# Patient Record
Sex: Female | Born: 1965 | Race: White | Hispanic: No | Smoking: Former smoker
Health system: Southern US, Community
[De-identification: ages and names within clinical notes are randomized; demographics above are authoritative.]

## PROBLEM LIST (undated history)

## (undated) DIAGNOSIS — C50919 Malignant neoplasm of unspecified site of unspecified female breast: Secondary | ICD-10-CM

## (undated) DIAGNOSIS — I10 Essential (primary) hypertension: Secondary | ICD-10-CM

## (undated) DIAGNOSIS — N390 Urinary tract infection, site not specified: Secondary | ICD-10-CM

## (undated) DIAGNOSIS — I499 Cardiac arrhythmia, unspecified: Secondary | ICD-10-CM

## (undated) DIAGNOSIS — T8859XA Other complications of anesthesia, initial encounter: Secondary | ICD-10-CM

## (undated) DIAGNOSIS — C801 Malignant (primary) neoplasm, unspecified: Secondary | ICD-10-CM

## (undated) DIAGNOSIS — K219 Gastro-esophageal reflux disease without esophagitis: Secondary | ICD-10-CM

## (undated) DIAGNOSIS — T7840XA Allergy, unspecified, initial encounter: Secondary | ICD-10-CM

## (undated) HISTORY — DX: Malignant (primary) neoplasm, unspecified: C80.1

## (undated) HISTORY — PX: MASTECTOMY: SHX3

## (undated) HISTORY — DX: Allergy, unspecified, initial encounter: T78.40XA

## (undated) HISTORY — DX: Gastro-esophageal reflux disease without esophagitis: K21.9

## (undated) HISTORY — DX: Essential (primary) hypertension: I10

## (undated) HISTORY — PX: PLANTAR FASCIA RELEASE: SHX2239

## (undated) HISTORY — DX: Urinary tract infection, site not specified: N39.0

## (undated) HISTORY — DX: Cardiac arrhythmia, unspecified: I49.9

## (undated) HISTORY — PX: OTHER SURGICAL HISTORY: SHX169

## (undated) HISTORY — PX: CHOLECYSTECTOMY: SHX55

## (undated) HISTORY — PX: SPINE SURGERY: SHX786

---

## 2003-07-17 HISTORY — PX: NASAL SINUS SURGERY: SHX719

## 2019-09-04 ENCOUNTER — Other Ambulatory Visit: Payer: Self-pay

## 2019-09-04 ENCOUNTER — Ambulatory Visit
Admission: EM | Admit: 2019-09-04 | Discharge: 2019-09-04 | Disposition: A | Discharge status: Home / Self Care | Payer: BC Managed Care – PPO | Attending: Emergency Medicine | Admitting: Emergency Medicine

## 2019-09-04 DIAGNOSIS — R030 Elevated blood-pressure reading, without diagnosis of hypertension: Secondary | ICD-10-CM

## 2019-09-04 DIAGNOSIS — R300 Dysuria: Secondary | ICD-10-CM | POA: Diagnosis not present

## 2019-09-04 LAB — POCT URINALYSIS DIP (MANUAL ENTRY)
Bilirubin, UA: NEGATIVE
Blood, UA: NEGATIVE
Glucose, UA: NEGATIVE mg/dL
Ketones, POC UA: NEGATIVE mg/dL
Leukocytes, UA: NEGATIVE
Nitrite, UA: NEGATIVE
Protein Ur, POC: NEGATIVE mg/dL
Spec Grav, UA: 1.015
Urobilinogen, UA: 0.2 U/dL
pH, UA: 6.5

## 2019-09-04 MED ORDER — SULFAMETHOXAZOLE-TRIMETHOPRIM 800-160 MG PO TABS: 1.0000 | ORAL_TABLET | Freq: Two times a day (BID) | ORAL | 0 refills | Status: AC

## 2019-09-04 NOTE — ED Triage Notes (Signed)
Pt presents with recurrent UTI; pt states she has had urinary urgency and lower pelvic pain since the first. Pt has been on macobid X 1 week by PCP. Pt states she had her first covid vaccine in the begininng of the month.

## 2019-09-04 NOTE — ED Provider Notes (Addendum)
[facility] VIDEO VISITS    CSN: [id number] Arrival date & time: 09/04/19  1254      History   Chief Complaint Chief Complaint  Patient presents with  . Recurrent UTI    HPI Chriistine James is a 54 y.o. female.   Patient presents with dysuria, frequency, urgency, suprapubic pain x2 weeks.  She has been taking Macrobid x5 days and her symptoms are getting worse; She has frequent UTIs and has a standing order for Macrobid to take if she has symptoms.  She now reports chills and flank pain.  Patient also reports her second COVID vaccine was given 2 weeks ago.  She denies fever, vomiting, rash, vaginal discharge, pelvic pain, or other symptoms.  She is not sexually active.    The history is provided by the patient.    History reviewed. No pertinent past medical history.  There are no problems to display for this patient.   Past Surgical History:  Procedure Laterality Date  . CHOLECYSTECTOMY    . L4-5 herniated disc repair    . PLANTAR FASCIA RELEASE      OB History   No obstetric history on file.      Home Medications    Prior to Admission medications   Medication Sig Start Date End Date Taking? Authorizing Provider  sulfamethoxazole-trimethoprim (BACTRIM DS) 800-160 MG tablet Take 1 tablet by mouth 2 (two) times daily for 5 days. 09/04/19 09/09/19  Sharion Balloon, NP    Family History Family History  Family history unknown: Yes    Social History Social History   Tobacco Use  . Smoking status: Never Smoker  Substance Use Topics  . Alcohol use: Not on file  . Drug use: Not on file     Allergies   Penicillins   Review of Systems Review of Systems  Constitutional: Positive for chills. Negative for fever.  HENT: Negative for ear pain and sore throat.   Eyes: Negative for pain and visual disturbance.  Respiratory: Negative for cough and shortness of breath.   Cardiovascular: Negative for chest pain and palpitations.  Gastrointestinal: Negative for  abdominal pain, diarrhea and vomiting.  Genitourinary: Positive for dysuria, flank pain, frequency and urgency. Negative for hematuria, pelvic pain and vaginal discharge.  Musculoskeletal: Negative for arthralgias and back pain.  Skin: Negative for color change and rash.  Neurological: Negative for seizures and syncope.  All other systems reviewed and are negative.    Physical Exam Triage Vital Signs ED Triage Vitals  Enc Vitals Group     BP      Pulse      Resp      Temp      Temp src      SpO2      Weight      Height      Head Circumference      Peak Flow      Pain Score      Pain Loc      Pain Edu?      Excl. in [place]?    No data found.  Updated Vital Signs BP (!) 167/113 (BP Location: Left Arm)   Pulse 86   Temp 98.8 F (37.1 C) (Oral)   Resp 18   SpO2 98%   Visual Acuity Right Eye Distance:   Left Eye Distance:   Bilateral Distance:    Right Eye Near:   Left Eye Near:    Bilateral Near:     Physical Exam Vitals and  nursing note reviewed.  Constitutional:      General: She is not in acute distress.    Appearance: She is well-developed. She is not ill-appearing.  HENT:     Head: Normocephalic and atraumatic.     Mouth/Throat:     Mouth: Mucous membranes are moist.     Pharynx: Oropharynx is clear.  Eyes:     Conjunctiva/sclera: Conjunctivae normal.  Cardiovascular:     Rate and Rhythm: Normal rate and regular rhythm.     Heart sounds: No murmur.  Pulmonary:     Effort: Pulmonary effort is normal. No respiratory distress.     Breath sounds: Normal breath sounds.  Abdominal:     General: Bowel sounds are normal.     Palpations: Abdomen is soft.     Tenderness: There is no abdominal tenderness. There is no right CVA tenderness, left CVA tenderness, guarding or rebound.  Musculoskeletal:     Cervical back: Neck supple.  Skin:    General: Skin is warm and dry.  Neurological:     General: No focal deficit present.     Mental Status: She is alert  and oriented to person, place, and time.  Psychiatric:        Mood and Affect: Mood normal.        Behavior: Behavior normal.      UC Treatments / Results  Labs (all labs ordered are listed, but only abnormal results are displayed) Labs Reviewed  POCT URINALYSIS DIP (MANUAL ENTRY)    EKG   Radiology No results found.  Procedures Procedures (including critical care time)  Medications Ordered in UC Medications - No data to display  Initial Impression / Assessment and Plan / UC Course  I have reviewed the triage vital signs and the nursing notes.  Pertinent labs & imaging results that were available during my care of the patient were reviewed by me and considered in my medical decision making (see chart for details).   Dysuria, likely UTI.  Patient's urine is negative but, given her symptoms, UTI may be masked by her current antibiotic.  D/C Macrobid.  Start Bactrim.  Instructed patient to follow-up with her PCP if her symptoms are not improving.  Discussed with patient that her blood pressure is elevated and needs to be rechecked by her PCP in 2 weeks; Patient states her blood pressure is elevated now because she has white coat syndrome and is pain; she regularly monitors her blood pressure at home and has a fitness watch that checks it also.  Patient agrees to plan of care.     Final Clinical Impressions(s) / UC Diagnoses   Final diagnoses:  Dysuria  Elevated blood pressure reading     Discharge Instructions     Take the antibiotic as directed.    Follow up with your primary care provider if your symptoms are not improving.    Your blood pressure is elevated today at 167/113.  Please have this rechecked by your primary care provider in 2 weeks.           ED Prescriptions    Medication Sig Dispense Auth. Provider   sulfamethoxazole-trimethoprim (BACTRIM DS) 800-160 MG tablet Take 1 tablet by mouth 2 (two) times daily for 5 days. 10 tablet Sharion Balloon, NP       PDMP not reviewed this encounter.   Sharion Balloon, NP 09/04/19 1333    Sharion Balloon, NP 09/04/19 1400

## 2019-09-04 NOTE — Discharge Instructions (Addendum)
Take the antibiotic as directed.    Follow up with your primary care provider if your symptoms are not improving.    Your blood pressure is elevated today at 167/113.  Please have this rechecked by your primary care provider in 2 weeks.

## 2019-09-15 ENCOUNTER — Encounter: Payer: Self-pay | Admitting: Adult Health

## 2019-09-15 ENCOUNTER — Other Ambulatory Visit: Payer: Self-pay

## 2019-09-15 ENCOUNTER — Ambulatory Visit: Payer: BC Managed Care – PPO | Admitting: Adult Health

## 2019-09-15 VITALS — BP 138/90 | HR 85 | Temp 96.9°F | Resp 16 | Ht 69.0 in | Wt 213.0 lb

## 2019-09-15 DIAGNOSIS — Z0000 Encounter for general adult medical examination without abnormal findings: Secondary | ICD-10-CM | POA: Diagnosis not present

## 2019-09-15 DIAGNOSIS — R82998 Other abnormal findings in urine: Secondary | ICD-10-CM

## 2019-09-15 DIAGNOSIS — Z6831 Body mass index (BMI) 31.0-31.9, adult: Secondary | ICD-10-CM

## 2019-09-15 DIAGNOSIS — E559 Vitamin D deficiency, unspecified: Secondary | ICD-10-CM

## 2019-09-15 DIAGNOSIS — Z6833 Body mass index (BMI) 33.0-33.9, adult: Secondary | ICD-10-CM

## 2019-09-15 DIAGNOSIS — Z780 Asymptomatic menopausal state: Secondary | ICD-10-CM

## 2019-09-15 DIAGNOSIS — Z1231 Encounter for screening mammogram for malignant neoplasm of breast: Secondary | ICD-10-CM

## 2019-09-15 DIAGNOSIS — I10 Essential (primary) hypertension: Secondary | ICD-10-CM

## 2019-09-15 HISTORY — DX: Essential (primary) hypertension: I10

## 2019-09-15 HISTORY — DX: Body mass index (BMI) 33.0-33.9, adult: Z68.33

## 2019-09-15 HISTORY — DX: Vitamin D deficiency, unspecified: E55.9

## 2019-09-15 HISTORY — DX: Asymptomatic menopausal state: Z78.0

## 2019-09-15 LAB — POCT URINALYSIS DIPSTICK
Bilirubin, UA: NEGATIVE
Blood, UA: NEGATIVE
Glucose, UA: NEGATIVE
Ketones, UA: NEGATIVE
Nitrite, UA: NEGATIVE
Protein, UA: NEGATIVE
Spec Grav, UA: <=1.005 — AB (ref 1.010–1.025)
Urobilinogen, UA: 0.2 E.U./dL
pH, UA: 7 (ref 5.0–8.0)

## 2019-09-15 MED ORDER — HYDROCHLOROTHIAZIDE 25 MG PO TABS: 25.0000 mg | ORAL_TABLET | Freq: Every day | ORAL | 0 refills | Status: DC

## 2019-09-15 NOTE — Patient Instructions (Addendum)
[place] at [facility]- call to schedule mammogram- inform them of last covid vaccine date.  [place], [place] [address] [address] from [place], [place] [address]   [place] Eating Plan [place] stands for "Dietary Approaches to Stop Hypertension." The DASH eating plan is a healthy eating plan that has been shown to reduce high blood pressure (hypertension). It may also reduce your risk for type 2 diabetes, heart disease, and stroke. The DASH eating plan may also help with weight loss. What are tips for following this plan?  General guidelines  Avoid eating more than 2,300 mg (milligrams) of salt (sodium) a day. If you have hypertension, you may need to reduce your sodium intake to 1,500 mg a day.  Limit alcohol intake to no more than 1 drink a day for nonpregnant women and 2 drinks a day for men. One drink equals 12 oz of beer, 5 oz of wine, or 1 oz of hard liquor.  Work with your health care provider to maintain a healthy body weight or to lose weight. Ask what an ideal weight is for you.  Get at least 30 minutes of exercise that causes your heart to beat faster (aerobic exercise) most days of the week. Activities may include walking, swimming, or biking.  Work with your health care provider or diet and nutrition specialist (dietitian) to adjust your eating plan to your individual calorie needs. Reading food labels   Check food labels for the amount of sodium per serving. Choose foods with less than 5 percent of the Daily Value of sodium. Generally, foods with less than 300 mg of sodium per serving fit into this eating plan.  To find whole grains, look for the word "whole" as the first word in the ingredient list. Shopping  Buy products labeled as "low-sodium" or "no salt added."  Buy fresh foods. Avoid canned foods and premade or frozen meals. Cooking  Avoid adding salt when cooking. Use salt-free seasonings or herbs instead of table salt  or sea salt. Check with your health care provider or pharmacist before using salt substitutes.  Do not fry foods. Cook foods using healthy methods such as baking, boiling, grilling, and broiling instead.  Cook with heart-healthy oils, such as olive, canola, soybean, or sunflower oil. Meal planning  Eat a balanced diet that includes: ? 5 or more servings of fruits and vegetables each day. At each meal, try to fill half of your plate with fruits and vegetables. ? Up to 6-8 servings of whole grains each day. ? Less than 6 oz of lean meat, poultry, or fish each day. A 3-oz serving of meat is about the same size as a deck of cards. One egg equals 1 oz. ? 2 servings of low-fat dairy each day. ? A serving of nuts, seeds, or beans 5 times each week. ? Heart-healthy fats. Healthy fats called Omega-3 fatty acids are found in foods such as flaxseeds and coldwater fish, like sardines, salmon, and mackerel.  Limit how much you eat of the following: ? Canned or prepackaged foods. ? Food that is high in trans fat, such as fried foods. ? Food that is high in saturated fat, such as fatty meat. ? Sweets, desserts, sugary drinks, and other foods with added sugar. ? Full-fat dairy products.  Do not salt foods before eating.  Try to eat at least 2 vegetarian meals each week.  Eat more home-cooked food and less restaurant, buffet, and fast food.  When eating  at a restaurant, ask that your food be prepared with less salt or no salt, if possible. What foods are recommended? The items listed may not be a complete list. Talk with your dietitian about what dietary choices are best for you. Grains Whole-grain or whole-wheat bread. Whole-grain or whole-wheat pasta. Brown rice. [place]row. Bulgur. Whole-grain and low-sodium cereals. Pita bread. Low-fat, low-sodium crackers. Whole-wheat flour tortillas. Vegetables Fresh or frozen vegetables (raw, steamed, roasted, or grilled). Low-sodium or reduced-sodium  tomato and vegetable juice. Low-sodium or reduced-sodium tomato sauce and tomato paste. Low-sodium or reduced-sodium canned vegetables. Fruits All fresh, dried, or frozen fruit. Canned fruit in natural juice (without added sugar). Meat and other protein foods Skinless chicken or [place]. Ground chicken or [place]. Pork with fat trimmed off. Fish and seafood. Egg whites. Dried beans, peas, or lentils. Unsalted nuts, nut butters, and seeds. Unsalted canned beans. Lean cuts of beef with fat trimmed off. Low-sodium, lean deli meat. Dairy Low-fat (1%) or fat-free (skim) milk. Fat-free, low-fat, or reduced-fat cheeses. Nonfat, low-sodium ricotta or cottage cheese. Low-fat or nonfat yogurt. Low-fat, low-sodium cheese. Fats and oils Soft margarine without trans fats. Vegetable oil. Low-fat, reduced-fat, or light mayonnaise and salad dressings (reduced-sodium). Canola, safflower, olive, soybean, and sunflower oils. Avocado. Seasoning and other foods Herbs. Spices. Seasoning mixes without salt. Unsalted popcorn and pretzels. Fat-free sweets. What foods are not recommended? The items listed may not be a complete list. Talk with your dietitian about what dietary choices are best for you. Grains Baked goods made with fat, such as croissants, muffins, or some breads. Dry pasta or rice meal packs. Vegetables Creamed or fried vegetables. Vegetables in a cheese sauce. Regular canned vegetables (not low-sodium or reduced-sodium). Regular canned tomato sauce and paste (not low-sodium or reduced-sodium). Regular tomato and vegetable juice (not low-sodium or reduced-sodium). Angie Fava. Olives. Fruits Canned fruit in a light or heavy syrup. Fried fruit. Fruit in cream or butter sauce. Meat and other protein foods Fatty cuts of meat. Ribs. Fried meat. Berniece Salines. Sausage. Bologna and other processed lunch meats. Salami. Fatback. Hotdogs. Bratwurst. Salted nuts and seeds. Canned beans with added salt. Canned or smoked fish.  Whole eggs or egg yolks. Chicken or [place] with skin. Dairy Whole or 2% milk, cream, and half-and-half. Whole or full-fat cream cheese. Whole-fat or sweetened yogurt. Full-fat cheese. Nondairy creamers. Whipped toppings. Processed cheese and cheese spreads. Fats and oils Butter. Stick margarine. Lard. Shortening. Ghee. Bacon fat. Tropical oils, such as coconut, palm kernel, or palm oil. Seasoning and other foods Salted popcorn and pretzels. Onion salt, garlic salt, seasoned salt, table salt, and sea salt. Worcestershire sauce. Tartar sauce. Barbecue sauce. Teriyaki sauce. Soy sauce, including reduced-sodium. Steak sauce. Canned and packaged gravies. Fish sauce. Oyster sauce. Cocktail sauce. Horseradish that you find on the shelf. Ketchup. Mustard. Meat flavorings and tenderizers. Bouillon cubes. Hot sauce and Tabasco sauce. Premade or packaged marinades. Premade or packaged taco seasonings. Relishes. Regular salad dressings. Where to find more information:  National Heart, Lung, and [place]: https://wilson-eaton.com/  American Heart Association: www.heart.org Summary  The DASH eating plan is a healthy eating plan that has been shown to reduce high blood pressure (hypertension). It may also reduce your risk for type 2 diabetes, heart disease, and stroke.  With the DASH eating plan, you should limit salt (sodium) intake to 2,300 mg a day. If you have hypertension, you may need to reduce your sodium intake to 1,500 mg a day.  When on the DASH eating plan, aim to  eat more fresh fruits and vegetables, whole grains, lean proteins, low-fat dairy, and heart-healthy fats.  Work with your health care provider or diet and nutrition specialist (dietitian) to adjust your eating plan to your individual calorie needs. This information is not intended to replace advice given to you by your health care provider. Make sure you discuss any questions you have with your health care provider. Document Revised:  06/14/2017 Document Reviewed: 06/25/2016 Elsevier Patient Education  [place]. Diltiazem Extended-Release Oral Capsules or Tablets What is this medicine? DILTIAZEM (dil TYE a zem) is a calcium channel blocker. It relaxes your blood vessels and decreases the amount of work the heart has to do. It treats high blood pressure and/or prevents chest pain (also called angina). This medicine may be used for other purposes; ask your health care provider or pharmacist if you have questions. COMMON BRAND NAME(S): Cardizem CD, Cardizem LA, Cardizem SR, Cartia XT, Dilacor XR, Dilt-CD, Diltia XT, Diltzac, Matzim LA, Rema Fendt, TIADYLT ER, Tiamate, Tiazac What should I tell my health care provider before I take this medicine? They need to know if you have any of these conditions:  heart attack  heart disease  irregular heartbeat or rhythm  low blood pressure  an unusual or allergic reaction to diltiazem, other drugs, foods, dyes, or preservatives  pregnant or trying to get pregnant  breast-feeding How should I use this medicine? Take this drug by mouth. Take it as directed on the prescription label at the same time every day. Do not cut, crush or chew this drug. Swallow the capsules whole. You can take it with or without food. If it upsets your stomach, take it with food. Keep taking it unless your health care provider tells you to stop. Talk to your health care provider about the use of this drug in children. Special care may be needed. Overdosage: If you think you have taken too much of this medicine contact a poison control center or emergency room at once. NOTE: This medicine is only for you. Do not share this medicine with others. What if I miss a dose? If you miss a dose, take it as soon as you can. If it is almost time for your next dose, take only that dose. Do not take double or extra doses. What may interact with this medicine? Do not take this medicine with any of the following  medications:  cisapride  hawthorn  pimozide  ranolazine  red yeast rice This medicine may also interact with the following medications:  buspirone  carbamazepine  cimetidine  cyclosporine  digoxin  local anesthetics or general anesthetics  lovastatin  medicines for anxiety or difficulty sleeping like midazolam and triazolam  medicines for high blood pressure or heart problems  quinidine  rifampin, rifabutin, or rifapentine This list may not describe all possible interactions. Give your health care provider a list of all the medicines, herbs, non-prescription drugs, or dietary supplements you use. Also tell them if you smoke, drink alcohol, or use illegal drugs. Some items may interact with your medicine. What should I watch for while using this medicine? Visit your health care provider for regular checks on your progress. Check your blood pressure as directed. Ask your health care provider what your blood pressure should be. Also, find out when you should contact him or her. Do not treat yourself for coughs, colds, or pain while you are using this drug without asking your health care provider for advice. Some drugs may increase your blood  pressure. This drug may cause serious skin reactions. They can happen weeks to months after starting the drug. Contact your health care provider right away if you notice fevers or flu-like symptoms with a rash. The rash may be red or purple and then turn into blisters or peeling of the skin. Or, you might notice a red rash with swelling of the face, lips or lymph nodes in your neck or under your arms. You may get drowsy or dizzy. Do not drive, use machinery, or do anything that needs mental alertness until you know how this drug affects you. Do not stand up or sit up quickly, especially if you are an older patient. This reduces the risk of dizzy or fainting spells. What side effects may I notice from receiving this medicine? Side effects  that you should report to your doctor or health care provider as soon as possible:  allergic reactions (skin rash, itching or hives; swelling of the face, lips, or tongue)  heart failure (trouble breathing; fast, irregular heartbeat; sudden weight gain; swelling of the ankles, feet, hands; unusually weak or tired)  heartbeat rhythm changes (trouble breathing; chest pain; dizziness; fast, irregular heartbeat; feeling faint or lightheaded, falls)  liver injury (dark yellow or brown urine; general ill feeling or flu-like symptoms; loss of appetite, right upper belly pain; unusually weak or tired, yellowing of the eyes or skin)  redness, blistering, peeling, or loosening of the skin, including inside the mouth Side effects that usually do not require medical attention (report to your doctor or health care provider if they continue or are bothersome):  changes in sex drive or performance  changes in vision  cough  depressed mood  headache  nasal congestion (like runny or stuffy nose)  sudden weight gain  trouble sleeping This list may not describe all possible side effects. Call your doctor for medical advice about side effects. You may report side effects to FDA at 1-800-FDA-1088. Where should I keep my medicine? Keep out of the reach of children and pets. Store at room temperature between 20 and 25 degrees C (68 and 77 degrees F). Protect from moisture. Keep the container tightly closed. Throw away any unused drug after the expiration date. NOTE: This sheet is a summary. It may not cover all possible information. If you have questions about this medicine, talk to your doctor, pharmacist, or health care provider.  2020 Elsevier/Gold Standard (2019-03-24 14:48:13) Hydrochlorothiazide, HCTZ Oral Capsules or Tablets What is this medicine? HYDROCHLOROTHIAZIDE (hye droe klor oh THYE a zide) is a diuretic. It helps you make more urine and to lose salt and excess water from your body. It  treats swelling from heart, kidney, or liver disease. It also treats high blood pressure. This medicine may be used for other purposes; ask your health care provider or pharmacist if you have questions. COMMON BRAND NAME(S): Esidrix, Ezide, HydroDIURIL, Microzide, Oretic, Zide What should I tell my health care provider before I take this medicine? They need to know if you have any of these conditions:  diabetes  gout  immune system problems, like lupus  kidney disease or kidney stones  liver disease  pancreatitis  small amount of urine or difficulty passing urine  an unusual or allergic reaction to hydrochlorothiazide, sulfa drugs, other medicines, foods, dyes, or preservatives  pregnant or trying to get pregnant  breast-feeding How should I use this medicine? Take this drug by mouth. Take it as directed on the prescription label at the same time every day.  You can take it with or without food. If it upsets your stomach, take it with food. Keep taking it unless your health care provider tells you to stop. Talk to your health care provider about the use of this drug in children. While it may be prescribed for children as young as newborns for selected conditions, precautions do apply. Overdosage: If you think you have taken too much of this medicine contact a poison control center or emergency room at once. NOTE: This medicine is only for you. Do not share this medicine with others. What if I miss a dose? If you miss a dose, take it as soon as you can. If it is almost time for your next dose, take only that dose. Do not take double or extra doses. What may interact with this medicine?  cholestyramine  colestipol  digoxin  dofetilide  lithium  medicines for blood pressure  medicines for diabetes  medicines that relax muscles for surgery  other diuretics  steroid medicines like prednisone or cortisone This list may not describe all possible interactions. Give your  health care provider a list of all the medicines, herbs, non-prescription drugs, or dietary supplements you use. Also tell them if you smoke, drink alcohol, or use illegal drugs. Some items may interact with your medicine. What should I watch for while using this medicine? Visit your doctor or health care professional for regular checks on your progress. Check your blood pressure as directed. Ask your doctor or health care professional what your blood pressure should be and when you should contact him or her. Talk to your health care professional about your risk of skin cancer. You may be more at risk for skin cancer if you take this medicine. This medicine can make you more sensitive to the sun. Keep out of the sun. If you cannot avoid being in the sun, wear protective clothing and use sunscreen. Do not use sun lamps or tanning beds/booths. You may need to be on a special diet while taking this medicine. Ask your doctor. Check with your doctor or health care professional if you get an attack of severe diarrhea, nausea and vomiting, or if you sweat a lot. The loss of too much body fluid can make it dangerous for you to take this medicine. You may get drowsy or dizzy. Do not drive, use machinery, or do anything that needs mental alertness until you know how this medicine affects you. Do not stand or sit up quickly, especially if you are an older patient. This reduces the risk of dizzy or fainting spells. Alcohol may interfere with the effect of this medicine. Avoid alcoholic drinks. This medicine may increase blood sugar. Ask your healthcare provider if changes in diet or medicines are needed if you have diabetes. What side effects may I notice from receiving this medicine? Side effects that you should report to your doctor or health care professional as soon as possible:  allergic reactions such as skin rash or itching, hives, swelling of the lips, mouth, tongue, or throat  changes in vision  chest  pain  eye pain  fast or irregular heartbeat  feeling faint or lightheaded, falls  gout attack  muscle pain or cramps  pain or difficulty when passing urine  pain, tingling, numbness in the hands or feet  redness, blistering, peeling or loosening of the skin, including inside the mouth   signs and symptoms of high blood sugar such as being more thirsty or hungry or having to urinate  more than normal. You may also feel very tired or have blurry vision.  unusually weak Side effects that usually do not require medical attention (report to your doctor or health care professional if they continue or are bothersome):  change in sex drive or performance  dry mouth  headache  stomach upset This list may not describe all possible side effects. Call your doctor for medical advice about side effects. You may report side effects to FDA at 1-800-FDA-1088. Where should I keep my medicine? Keep out of the reach of children and pets. Store at room temperature between 20 and 25 degrees C (68 and 77 degrees F). Protect from light and moisture. Keep the container tightly closed. Do not freeze. Throw away any unused drug after the expiration date. NOTE: This sheet is a summary. It may not cover all possible information. If you have questions about this medicine, talk to your doctor, pharmacist, or health care provider.  2020 Elsevier/Gold Standard (2019-03-05 16:52:59)  Hypertension, Adult Hypertension is another name for high blood pressure. High blood pressure forces your heart to work harder to pump blood. This can cause problems over time. There are two numbers in a blood pressure reading. There is a top number (systolic) over a bottom number (diastolic). It is best to have a blood pressure that is below 120/80. Healthy choices can help lower your blood pressure, or you may need medicine to help lower it. What are the causes? The cause of this condition is not known. Some conditions may be  related to high blood pressure. What increases the risk?  Smoking.  Having type 2 diabetes mellitus, high cholesterol, or both.  Not getting enough exercise or physical activity.  Being overweight.  Having too much fat, sugar, calories, or salt (sodium) in your diet.  Drinking too much alcohol.  Having long-term (chronic) kidney disease.  Having a family history of high blood pressure.  Age. Risk increases with age.  Race. You may be at higher risk if you are African American.  Gender. Men are at higher risk than women before age 70. After age 55, women are at higher risk than men.  Having obstructive sleep apnea.  Stress. What are the signs or symptoms?  High blood pressure may not cause symptoms. Very high blood pressure (hypertensive crisis) may cause: ? Headache. ? Feelings of worry or nervousness (anxiety). ? Shortness of breath. ? Nosebleed. ? A feeling of being sick to your stomach (nausea). ? Throwing up (vomiting). ? Changes in how you see. ? Very bad chest pain. ? Seizures. How is this treated?  This condition is treated by making healthy lifestyle changes, such as: ? Eating healthy foods. ? Exercising more. ? Drinking less alcohol.  Your health care provider may prescribe medicine if lifestyle changes are not enough to get your blood pressure under control, and if: ? Your top number is above 130. ? Your bottom number is above 80.  Your personal target blood pressure may vary. Follow these instructions at home: Eating and drinking   If told, follow the DASH eating plan. To follow this plan: ? Fill one half of your plate at each meal with fruits and vegetables. ? Fill one fourth of your plate at each meal with whole grains. Whole grains include whole-wheat pasta, brown rice, and whole-grain bread. ? Eat or drink low-fat dairy products, such as skim milk or low-fat yogurt. ? Fill one fourth of your plate at each meal with low-fat (lean) proteins.  Low-fat  proteins include fish, chicken without skin, eggs, beans, and tofu. ? Avoid fatty meat, cured and processed meat, or chicken with skin. ? Avoid pre-made or processed food.  Eat less than 1,500 mg of salt each day.  Do not drink alcohol if: ? Your doctor tells you not to drink. ? You are pregnant, may be pregnant, or are planning to become pregnant.  If you drink alcohol: ? Limit how much you use to:  0-1 drink a day for women.  0-2 drinks a day for men. ? Be aware of how much alcohol is in your drink. In the [place] one drink equals one 12 oz bottle of beer (355 mL), one 5 oz glass of wine (148 mL), or one 1 oz glass of hard liquor (44 mL). Lifestyle   Work with your doctor to stay at a healthy weight or to lose weight. Ask your doctor what the best weight is for you.  Get at least 30 minutes of exercise most days of the week. This may include walking, swimming, or biking.  Get at least 30 minutes of exercise that strengthens your muscles (resistance exercise) at least 3 days a week. This may include lifting weights or doing Pilates.  Do not use any products that contain nicotine or tobacco, such as cigarettes, e-cigarettes, and chewing tobacco. If you need help quitting, ask your doctor.  Check your blood pressure at home as told by your doctor.  Keep all follow-up visits as told by your doctor. This is important. Medicines  Take over-the-counter and prescription medicines only as told by your doctor. Follow directions carefully.  Do not skip doses of blood pressure medicine. The medicine does not work as well if you skip doses. Skipping doses also puts you at risk for problems.  Ask your doctor about side effects or reactions to medicines that you should watch for. Contact a doctor if you:  Think you are having a reaction to the medicine you are taking.  Have headaches that keep coming back (recurring).  Feel dizzy.  Have swelling in your ankles.  Have trouble  with your vision. Get help right away if you:  Get a very bad headache.  Start to feel mixed up (confused).  Feel weak or numb.  Feel faint.  Have very bad pain in your: ? Chest. ? Belly (abdomen).  Throw up more than once.  Have trouble breathing. Summary  Hypertension is another name for high blood pressure.  High blood pressure forces your heart to work harder to pump blood.  For most people, a normal blood pressure is less than 120/80.  Making healthy choices can help lower blood pressure. If your blood pressure does not get lower with healthy choices, you may need to take medicine. This information is not intended to replace advice given to you by your health care provider. Make sure you discuss any questions you have with your health care provider. Document Revised: 03/12/2018 Document Reviewed: 03/12/2018 Elsevier Patient Education  2020 [place] for [employer] Calories are units of energy. Your body needs a certain amount of calories from food to keep you going throughout the day. When you eat more calories than your body needs, your body stores the extra calories as fat. When you eat fewer calories than your body needs, your body burns fat to get the energy it needs. Calorie counting means keeping track of how many calories you eat and drink each day. Calorie counting can  be helpful if you need to lose weight. If you make sure to eat fewer calories than your body needs, you should lose weight. Ask your health care provider what a healthy weight is for you. For calorie counting to work, you will need to eat the right number of calories in a day in order to lose a healthy amount of weight per week. A dietitian can help you determine how many calories you need in a day and will give you suggestions on how to reach your calorie goal.  A healthy amount of weight to lose per week is usually 1-2 lb (0.5-0.9 kg). This usually means that your daily  calorie intake should be reduced by 500-750 calories.  Eating 1,200 - 1,500 calories per day can help most women lose weight.  Eating 1,500 - 1,800 calories per day can help most men lose weight. What is my plan? My goal is to have __________ calories per day. If I have this many calories per day, I should lose around __________ pounds per week. What do I need to know about calorie counting? In order to meet your daily calorie goal, you will need to:  Find out how many calories are in each food you would like to eat. Try to do this before you eat.  Decide how much of the food you plan to eat.  Write down what you ate and how many calories it had. Doing this is called keeping a food log. To successfully lose weight, it is important to balance calorie counting with a healthy lifestyle that includes regular activity. Aim for 150 minutes of moderate exercise (such as walking) or 75 minutes of vigorous exercise (such as running) each week. Where do I find calorie information?  The number of calories in a food can be found on a Nutrition Facts label. If a food does not have a Nutrition Facts label, try to look up the calories online or ask your dietitian for help. Remember that calories are listed per serving. If you choose to have more than one serving of a food, you will have to multiply the calories per serving by the amount of servings you plan to eat. For example, the label on a package of bread might say that a serving size is 1 slice and that there are 90 calories in a serving. If you eat 1 slice, you will have eaten 90 calories. If you eat 2 slices, you will have eaten 180 calories. How do I keep a food log? Immediately after each meal, record the following information in your food log:  What you ate. Don't forget to include toppings, sauces, and other extras on the food.  How much you ate. This can be measured in cups, ounces, or number of items.  How many calories each food and drink  had.  The total number of calories in the meal. Keep your food log near you, such as in a small notebook in your pocket, or use a mobile app or website. Some programs will calculate calories for you and show you how many calories you have left for the day to meet your goal. What are some calorie counting tips?   Use your calories on foods and drinks that will fill you up and not leave you hungry: ? Some examples of foods that fill you up are nuts and nut butters, vegetables, lean proteins, and high-fiber foods like whole grains. High-fiber foods are foods with more than 5 g fiber per serving. ?  Drinks such as sodas, specialty coffee drinks, alcohol, and juices have a lot of calories, yet do not fill you up.  Eat nutritious foods and avoid empty calories. Empty calories are calories you get from foods or beverages that do not have many vitamins or protein, such as candy, sweets, and soda. It is better to have a nutritious high-calorie food (such as an avocado) than a food with few nutrients (such as a bag of chips).  Know how many calories are in the foods you eat most often. This will help you calculate calorie counts faster.  Pay attention to calories in drinks. Low-calorie drinks include water and unsweetened drinks.  Pay attention to nutrition labels for "low fat" or "fat free" foods. These foods sometimes have the same amount of calories or more calories than the full fat versions. They also often have added sugar, starch, or salt, to make up for flavor that was removed with the fat.  Find a way of tracking calories that works for you. Get creative. Try different apps or programs if writing down calories does not work for you. What are some portion control tips?  Know how many calories are in a serving. This will help you know how many servings of a certain food you can have.  Use a measuring cup to measure serving sizes. You could also try weighing out portions on a kitchen scale. With  time, you will be able to estimate serving sizes for some foods.  Take some time to put servings of different foods on your favorite plates, bowls, and cups so you know what a serving looks like.  Try not to eat straight from a bag or box. Doing this can lead to overeating. Put the amount you would like to eat in a cup or on a plate to make sure you are eating the right portion.  Use smaller plates, glasses, and bowls to prevent overeating.  Try not to multitask (for example, watch TV or use your computer) while eating. If it is time to eat, sit down at a table and enjoy your food. This will help you to know when you are full. It will also help you to be aware of what you are eating and how much you are eating. What are tips for following this plan? Reading food labels  Check the calorie count compared to the serving size. The serving size may be smaller than what you are used to eating.  Check the source of the calories. Make sure the food you are eating is high in vitamins and protein and low in saturated and trans fats. Shopping  Read nutrition labels while you shop. This will help you make healthy decisions before you decide to purchase your food.  Make a grocery list and stick to it. Cooking  Try to cook your favorite foods in a healthier way. For example, try baking instead of frying.  Use low-fat dairy products. Meal planning  Use more fruits and vegetables. Half of your plate should be fruits and vegetables.  Include lean proteins like poultry and fish. How do I count calories when eating out?  Ask for smaller portion sizes.  Consider sharing an entree and sides instead of getting your own entree.  If you get your own entree, eat only half. Ask for a box at the beginning of your meal and put the rest of your entree in it so you are not tempted to eat it.  If calories are listed on the  menu, choose the lower calorie options.  Choose dishes that include vegetables,  fruits, whole grains, low-fat dairy products, and lean protein.  Choose items that are boiled, broiled, grilled, or steamed. Stay away from items that are buttered, battered, fried, or served with cream sauce. Items labeled "crispy" are usually fried, unless stated otherwise.  Choose water, low-fat milk, unsweetened iced tea, or other drinks without added sugar. If you want an alcoholic beverage, choose a lower calorie option such as a glass of wine or light beer.  Ask for dressings, sauces, and syrups on the side. These are usually high in calories, so you should limit the amount you eat.  If you want a salad, choose a garden salad and ask for grilled meats. Avoid extra toppings like bacon, cheese, or fried items. Ask for the dressing on the side, or ask for olive oil and vinegar or lemon to use as dressing.  Estimate how many servings of a food you are given. For example, a serving of cooked rice is  cup or about the size of half a baseball. Knowing serving sizes will help you be aware of how much food you are eating at restaurants. The list below tells you how big or small some common portion sizes are based on everyday objects: ? 1 oz--4 stacked dice. ? 3 oz--1 deck of cards. ? 1 tsp--1 die. ? 1 Tbsp-- a ping-pong ball. ? 2 Tbsp--1 ping-pong ball. ?  cup-- baseball. ? 1 cup--1 baseball. Summary  Calorie counting means keeping track of how many calories you eat and drink each day. If you eat fewer calories than your body needs, you should lose weight.  A healthy amount of weight to lose per week is usually 1-2 lb (0.5-0.9 kg). This usually means reducing your daily calorie intake by 500-750 calories.  The number of calories in a food can be found on a Nutrition Facts label. If a food does not have a Nutrition Facts label, try to look up the calories online or ask your dietitian for help.  Use your calories on foods and drinks that will fill you up, and not on foods and drinks that  will leave you hungry.  Use smaller plates, glasses, and bowls to prevent overeating. This information is not intended to replace advice given to you by your health care provider. Make sure you discuss any questions you have with your health care provider. Document Revised: 03/21/2018 Document Reviewed: 06/01/2016 [employer] Patient Education  [place].

## 2019-09-15 NOTE — Progress Notes (Signed)
Patient: Kirsten James, Female    DOB: 1965-12-18, 54 y.o.   MRN: [id number] Visit Date: 09/15/2019  Today's Provider: Marcille Buffy, FNP   Chief Complaint  Patient presents with  . New Patient (Initial Visit)   Subjective:    New Patient Kirsten James is a 54 y.o. female who presents today for health maintenance and establish care as a new patient.  Patient reports that she moved to [facility] from [place], patient is a former patient of [employer], patient reports that her last pap examination was 2019 at [facility] and reports that pap was normal.   She feels fairly well today, patient would like to address concerns of hypertension. Patient reports that she was seen at urgent care last week for UTI like symptoms and blood pressure at office visit was 130/110. Patient states that she was previously prescribed Cartia  XT 180 mg  by her former PCP for hypertension but states that she had fair compliance on medication.  She has had hypertension since her second child was born- 2002. She reports she had been able to come off of her medications with diet and exercise. She has had previous stress testing in past was normal. She started her Cartia XT 180vmg back last Friday.  She denies any other cardiac symptoms.  Grandmother had CHF. Denies any other heart disease.  She went through separation, lost 30 lbs, and her blood pressure was normal without medications. [place] cardiologust [place] of state and was cleared.   February she had her second Covid shot- she started [place] XT 180 mg QD when she noticed her blood pressure was up at urgent care and this  was restarted on 09/04/19. She had previously been on this medication about a year prior and her cholesterol was under control.  She was very fatigued with the second immunization. She went to the urgent care and took Bactrim DS for urinary tract infection.  Denies any urinary symptoms.    She reports  exercising walking 2.5 miles daily and going to gym.Denies any issues with exercising.  Denies any shortness of breath.    She reports she is sleeping poorly, patient reports that for the past year she has had menopause like symptoms and states that she has had increased sweating at night which has caused her to have poor sleep. Patient reports for the past year she has experienced crying episodes, hot flashes, mood swings and night sweats. She was previously placed on Effexor in the past but it elevated her blood pressure so she had to discontinue.   She has had increased hot flashes, mood swings, crying at commercials.  She can not tolerate Effexor due to her blood pressure.   Denies any edema.  Mammogram May 2019. Heterogeneously dense. Repeat in year. Screening.   ----------------------------------------------------------------- Patient reports that she had a cologuard screening 07/2018 and reports that screening was negative No LMP recorded. (Menstrual status: Perimenopausal). Last May 2020,PAP smear 2019 and was normal- denies any abnormal's. Denies any abnormal vaginal bleeding or spotting. Denies any pelvic or abdominal pain.   Patient  denies any fever, body aches,chills, rash, chest pain, shortness of breath, nausea, vomiting, or diarrhea.    Review of Systems  Constitutional: Positive for diaphoresis (hot flashes occasional at night x 1 year.). Negative for activity change, appetite change, chills, fatigue, fever and unexpected weight change.  HENT: Negative.   Eyes: Negative.        She saw  eye doctor last year no concerns.  She has recently seen a new dentist for exams.   Respiratory: Negative.   Cardiovascular: Negative.   Gastrointestinal: Negative.   Endocrine: Positive for heat intolerance. Negative for cold intolerance, polydipsia, polyphagia and polyuria.  Genitourinary: Negative.   Musculoskeletal: Negative.   Skin: Negative.   Allergic/Immunologic: Positive for  environmental allergies. Negative for food allergies and immunocompromised state.  Neurological: Negative.   Hematological: Negative.   Psychiatric/Behavioral: Negative.   All other systems reviewed and are negative.   Social History She  reports that she has been smoking. She has never used smokeless tobacco. She reports that she does not drink alcohol or use drugs. Social History   Socioeconomic History  . Marital status: Married    Spouse name: Not on file  . Number of children: Not on file  . Years of education: Not on file  . Highest education level: Not on file  Occupational History  . Not on file  Tobacco Use  . Smoking status: Current Every Day Smoker  . Smokeless tobacco: Never Used  Substance and Sexual Activity  . Alcohol use: Never  . Drug use: Never  . Sexual activity: Not Currently  Other Topics Concern  . Not on file  Social History Narrative  . Not on file   Social Determinants of Health   Financial Resource Strain:   . Difficulty of Paying Living Expenses: Not on file  Food Insecurity:   . Worried About Charity fundraiser in the Last Year: Not on file  . Ran Out of Food in the Last Year: Not on file  Transportation Needs:   . Lack of Transportation (Medical): Not on file  . Lack of Transportation (Non-Medical): Not on file  Physical Activity:   . Days of Exercise per Week: Not on file  . Minutes of Exercise per Session: Not on file  Stress:   . Feeling of Stress : Not on file  Social Connections:   . Frequency of Communication with Friends and Family: Not on file  . Frequency of Social Gatherings with Friends and Family: Not on file  . Attends Religious Services: Not on file  . Active Member of Clubs or Organizations: Not on file  . Attends Archivist Meetings: Not on file  . Marital Status: Not on file    There are no problems to display for this patient.   Past Surgical History:  Procedure Laterality Date  . CHOLECYSTECTOMY      . L4-5 herniated disc repair    . PLANTAR FASCIA RELEASE      Family History  Family Status  Relation Name Status  . Mother  (Not Specified)  . Father  (Not Specified)  . MGM  (Not Specified)   Her family history includes Alzheimer's disease in her maternal grandmother; Cancer in her father and mother.     Allergies  Allergen Reactions  . Amoxicillin Hives  . Other     Allergy to pollen and mold- patient reports watery eyes, runny nose and sneezing as reaction  . Penicillins Hives    Previous Medications   ACETAMINOPHEN (TYLENOL) 325 MG TABLET    Take 650 mg by mouth every 6 (six) hours as needed.   AZELASTINE (ASTELIN) 0.1 % NASAL SPRAY    Place into both nostrils 2 (two) times daily. Use in each nostril as directed   CHOLECALCIFEROL (VITAMIN D3 PO)    Take by mouth. 5000IU   DILTIAZEM (CARDIZEM CD)  180 MG 24 HR CAPSULE    Take 180 mg by mouth daily.   EQL EVENING PRIMROSE OIL PO    Take by mouth. 1300mg  qd   FEXOFENADINE-PSEUDOEPHEDRINE (ALLEGRA-D 24) 180-240 MG 24 HR TABLET    Take 1 tablet by mouth daily.   FLUTICASONE (FLONASE) 50 MCG/ACT NASAL SPRAY    Place into both nostrils daily.   MULTIPLE VITAMIN (MULTIVITAMIN) TABLET    Take 1 tablet by mouth daily.   NITROFURANTOIN (MACRODANTIN) 50 MG CAPSULE    Take 50 mg by mouth 4 (four) times daily. Patient reports PRN for UTI   OMEGA 3 1000 MG CAPS    Take by mouth.   TURMERIC CURCUMIN 500 MG CAPS    Take by mouth.    Patient Care Team: Doreen Beam, FNP as PCP - General (Family Medicine)      Objective:   Vitals: BP (!) 140/102   Pulse 85   Temp (!) 96.9 F (36.1 C) (Oral)   Resp 16   Ht 5\' 9"  (1.753 m)   Wt 213 lb (96.6 kg)   SpO2 98%   BMI 31.45 kg/m    Physical Exam Vitals reviewed.  Constitutional:      General: She is not in acute distress.    Appearance: She is well-developed. She is not diaphoretic.     Interventions: She is not intubated. HENT:     Head: Normocephalic and atraumatic.      Right Ear: External ear normal.     Left Ear: External ear normal.     Nose: Nose normal.     Mouth/Throat:     Pharynx: No oropharyngeal exudate.  Eyes:     General: Lids are normal. No scleral icterus.       Right eye: No discharge.        Left eye: No discharge.     Conjunctiva/sclera: Conjunctivae normal.     Right eye: Right conjunctiva is not injected. No exudate or hemorrhage.    Left eye: Left conjunctiva is not injected. No exudate or hemorrhage.    Pupils: Pupils are equal, round, and reactive to light.  Neck:     Thyroid: No thyroid mass or thyromegaly.     Vascular: Normal carotid pulses. No carotid bruit, hepatojugular reflux or JVD.     Trachea: Trachea and phonation normal. No tracheal tenderness or tracheal deviation.     Meningeal: Brudzinski's sign and Kernig's sign absent.  Cardiovascular:     Rate and Rhythm: Normal rate and regular rhythm.     Pulses: Normal pulses.          Radial pulses are 2+ on the right side and 2+ on the left side.       Dorsalis pedis pulses are 2+ on the right side and 2+ on the left side.       Posterior tibial pulses are 2+ on the right side and 2+ on the left side.     Heart sounds: Normal heart sounds, S1 normal and S2 normal. Heart sounds not distant. No murmur. No friction rub. No gallop.   Pulmonary:     Effort: Pulmonary effort is normal. No tachypnea, bradypnea, accessory muscle usage or respiratory distress. She is not intubated.     Breath sounds: Normal breath sounds. No stridor. No wheezing or rales.  Chest:     Chest wall: No tenderness.  Abdominal:     General: Bowel sounds are normal. There is no distension or abdominal bruit.  Palpations: Abdomen is soft. There is no shifting dullness, fluid wave, hepatomegaly, splenomegaly, mass or pulsatile mass.     Tenderness: There is no abdominal tenderness. There is no guarding or rebound.     Hernia: No hernia is present.  Genitourinary:    Comments: Declines any concerns.  No abnormal vaginal discharge. No spotting or discharge. Denies rectal pain, pressure, or bloody or dark colored stools.  Last normal pap reported by patient as 2019. denies ever having an abnormal. Musculoskeletal:        General: No tenderness or deformity. Normal range of motion.     Cervical back: Full passive range of motion without pain, normal range of motion and neck supple. No edema, erythema or rigidity. No spinous process tenderness or muscular tenderness. Normal range of motion.  Lymphadenopathy:     Head:     Right side of head: No submental, submandibular, tonsillar, preauricular, posterior auricular or occipital adenopathy.     Left side of head: No submental, submandibular, tonsillar, preauricular, posterior auricular or occipital adenopathy.     Cervical: No cervical adenopathy.     Right cervical: No superficial, deep or posterior cervical adenopathy.    Left cervical: No superficial, deep or posterior cervical adenopathy.     Upper Body:     Right upper body: No supraclavicular or pectoral adenopathy.     Left upper body: No supraclavicular or pectoral adenopathy.  Skin:    General: Skin is warm and dry.     Coloration: Skin is not pale.     Findings: No abrasion, bruising, burn, ecchymosis, erythema, lesion, petechiae or rash.     Nails: There is no clubbing.  Neurological:     Mental Status: She is alert and oriented to person, place, and time.     GCS: GCS eye subscore is 4. GCS verbal subscore is 5. GCS motor subscore is 6.     Cranial Nerves: No cranial nerve deficit.     Sensory: No sensory deficit.     Motor: No tremor, atrophy, abnormal muscle tone or seizure activity.     Coordination: Coordination normal.     Gait: Gait normal.     Deep Tendon Reflexes: Reflexes are normal and symmetric. Reflexes normal. Babinski sign absent on the right side. Babinski sign absent on the left side.     Reflex Scores:      Tricep reflexes are 2+ on the right side and 2+ on  the left side.      Bicep reflexes are 2+ on the right side and 2+ on the left side.      Brachioradialis reflexes are 2+ on the right side and 2+ on the left side.      Patellar reflexes are 2+ on the right side and 2+ on the left side.      Achilles reflexes are 2+ on the right side and 2+ on the left side. Psychiatric:        Speech: Speech normal.        Behavior: Behavior normal.        Thought Content: Thought content normal.        Judgment: Judgment normal.      Depression Screen PHQ 2/9 Scores 09/15/2019 09/15/2019  PHQ - 2 Score 0 0  PHQ- 9 Score 0 -       Assessment & Plan:     Routine Health Maintenance and Physical Exam  Exercise Activities and Dietary recommendations Goals   None  Immunization History  Administered Date(s) Administered  . PFIZER SARS-COV-2 Vaccination 08/10/2019, 08/31/2019  . PPD Test 05/01/2019    Health Maintenance  Topic Date Due  . HIV Screening  11/14/1980  . TETANUS/TDAP  11/14/1984  . PAP SMEAR-Modifier  11/15/1986  . MAMMOGRAM  11/15/2015  . Fecal DNA (Cologuard)  11/15/2015  . INFLUENZA VACCINE  02/14/2019   Dentist twice yearly.  Eye exam yearly.   Discussed health benefits of physical activity, and encouraged her to engage in regular exercise appropriate for her age and condition.     1. Encounter for routine adult health examination without abnormal findings Routine health maintenance discussed.  - POCT urinalysis dipstick  2. Encounter for screening mammogram for malignant neoplasm of breast  - MM Digital Screening; Future  3. Body mass index (BMI) of 31.0-31.9 in adult Encourage weight loss/ diet and exercise.   4. Leukocytes in urine Sent for culture given recent history. - Urine Culture  5. Menopause Discussed symptoms.  Will check the following.  - FSH/LH - Testosterone,Free and Total - Progesterone  6. Essential hypertension   She will continue her Cartia XT  180 mg daily since this before  kept her blood pressure under control and she just restarted last Friday. Will add HCTZ 25 mg by mouth daily in the morning. Remain hydrated. DASH diet.  Report any hypertension that is persistent or any hypotensive episodes. Keep log of blood pressure readings. Discussed RED flags. Fasting labs this week. She denies any kidney or liver dysfunction in the past or thyroid disease.  Discussed side effects of both blood pressure medications. She could not take lisinopril due to side effect. of cough.   Meds ordered this encounter  Medications  . hydrochlorothiazide (HYDRODIURIL) 25 MG tablet    Sig: Take 1 tablet (25 mg total) by mouth daily.    Dispense:  30 tablet    Refill:  0   - EKG 12-Lead- EKG reviewed and within normal limits. NO other associated cardiac symptoms.  - CBC with Differential/Platelet - Comprehensive Metabolic Panel (CMET) - TSH; Future - Lipid Panel w/o Chol/HDL Ratio  7. Vitamin D deficiency She is on supplement will recheck level.  - VITAMIN D 25 Hydroxy (Vit-D Deficiency, Fractures)   Return in about 3 weeks (around 10/06/2019), or if symptoms worsen or fail to improve, for at any time for any worsening symptoms, Go to Emergency room/ urgent care if worse.  Advised patient call the office or your primary care doctor for an appointment if no improvement within 72 hours or if any symptoms change or worsen at any time  Advised ER or urgent Care if after hours or on weekend. Call 911 for emergency symptoms at any time.Patinet verbalized understanding of all instructions given/reviewed and treatment plan and has no further questions or concerns at this time.   The entirety of the information documented in the History of Present Illness, Review of Systems and Physical Exam were personally obtained by me. Portions of this information were initially documented by the  Certified Medical Assistant whose name is documented in [facility] [place] and reviewed by me for thoroughness and accuracy.    I have personally performed the exam and reviewed the chart and it is accurate to the best of my knowledge.  Haematologist has been used and any errors in dictation or transcription are unintentional.  Kelby Aline. [facility]   --------------------------------------------------------------------

## 2019-09-17 LAB — URINE CULTURE: Organism ID, Bacteria: NO GROWTH

## 2019-09-17 NOTE — Progress Notes (Signed)
No growth on urine. No treatment advised. Return if any symptoms return.

## 2019-09-21 ENCOUNTER — Other Ambulatory Visit: Payer: Self-pay

## 2019-09-21 DIAGNOSIS — I10 Essential (primary) hypertension: Secondary | ICD-10-CM

## 2019-09-25 LAB — COMPREHENSIVE METABOLIC PANEL
ALT: 24 IU/L (ref 0–32)
AST: 21 IU/L (ref 0–40)
Albumin/Globulin Ratio: 1.8 (ref 1.2–2.2)
Albumin: 4.7 g/dL (ref 3.8–4.9)
Alkaline Phosphatase: 99 IU/L (ref 39–117)
BUN/Creatinine Ratio: 15 (ref 9–23)
BUN: 10 mg/dL (ref 6–24)
Bilirubin Total: 0.4 mg/dL (ref 0.0–1.2)
CO2: 23 mmol/L (ref 20–29)
Calcium: 9.8 mg/dL (ref 8.7–10.2)
Chloride: 101 mmol/L (ref 96–106)
Creatinine, Ser: 0.67 mg/dL (ref 0.57–1.00)
GFR calc Af Amer: 116 mL/min/{1.73_m2} (ref >59)
GFR calc non Af Amer: 101 mL/min/{1.73_m2} (ref >59)
Globulin, Total: 2.6 g/dL (ref 1.5–4.5)
Glucose: 95 mg/dL (ref 65–99)
Potassium: 4.3 mmol/L (ref 3.5–5.2)
Sodium: 139 mmol/L (ref 134–144)
Total Protein: 7.3 g/dL (ref 6.0–8.5)

## 2019-09-25 LAB — CBC WITH DIFFERENTIAL/PLATELET
Basophils Absolute: 0.1 10*3/uL (ref 0.0–0.2)
Basos: 1 %
EOS (ABSOLUTE): 0.1 10*3/uL (ref 0.0–0.4)
Eos: 2 %
Hematocrit: 48.3 % — ABNORMAL HIGH (ref 34.0–46.6)
Hemoglobin: 16.1 g/dL — ABNORMAL HIGH (ref 11.1–15.9)
Immature Grans (Abs): 0 10*3/uL (ref 0.0–0.1)
Immature Granulocytes: 0 %
Lymphocytes Absolute: 2.8 10*3/uL (ref 0.7–3.1)
Lymphs: 40 %
MCH: 28.7 pg (ref 26.6–33.0)
MCHC: 33.3 g/dL (ref 31.5–35.7)
MCV: 86 fL (ref 79–97)
Monocytes Absolute: 0.5 10*3/uL (ref 0.1–0.9)
Monocytes: 7 %
Neutrophils Absolute: 3.6 10*3/uL (ref 1.4–7.0)
Neutrophils: 50 %
Platelets: 296 10*3/uL (ref 150–450)
RBC: 5.61 x10E6/uL — ABNORMAL HIGH (ref 3.77–5.28)
RDW: 14.1 % (ref 11.7–15.4)
WBC: 7.1 10*3/uL (ref 3.4–10.8)

## 2019-09-25 LAB — PROGESTERONE: Progesterone: 0.2 ng/mL

## 2019-09-25 LAB — FSH/LH
FSH: 91.5 m[IU]/mL
LH: 47.7 m[IU]/mL

## 2019-09-25 LAB — TESTOSTERONE,FREE AND TOTAL
Testosterone, Free: 2.4 pg/mL (ref 0.0–4.2)
Testosterone: 12 ng/dL (ref 3–41)

## 2019-09-25 LAB — LIPID PANEL W/O CHOL/HDL RATIO
Cholesterol, Total: 193 mg/dL (ref 100–199)
HDL: 67 mg/dL (ref >39)
LDL Chol Calc (NIH): 109 mg/dL — ABNORMAL HIGH (ref 0–99)
Triglycerides: 96 mg/dL (ref 0–149)
VLDL Cholesterol Cal: 17 mg/dL (ref 5–40)

## 2019-09-25 LAB — TSH: TSH: 1.34 u[IU]/mL (ref 0.450–4.500)

## 2019-09-25 LAB — VITAMIN D 25 HYDROXY (VIT D DEFICIENCY, FRACTURES): Vit D, 25-Hydroxy: 44.8 ng/mL (ref 30.0–100.0)

## 2019-09-25 NOTE — Progress Notes (Signed)
We added on a TIBC and iron panel and it is still pending.   1.She has elevated red blood cells slightly, elevated hemoglobin and hematocrit.  Verify if she is taking a multivitamin with iron, any iron supplements or drinks/ supplements high in iron ? If so we should stop the iron and recheck this level in 1-2 month at lab repeat CBC.   2. If no supplements then please ask - Any history of personal or family history of hemochromatosis ? Has she had elevated Hgb/ Hct in past ?   3.Her hormone labs do show she is post menopausal- if she wants to discuss hormones we can send her to gynecology.   4.Vitamin D level is normal, recommend maintaining  by taking Vitamin D 3 over the counter at 2,000 to 4,000 international units daily by mouth and rechecking lab in around 4 to 6 months.   5.LDL mildly elevated at 109.  Discuss lifestyle modification with patient e.g. increase exercise, fiber, fruits, vegetables, lean meat, and omega 3/fish intake and decrease saturated fat.    Needs CBC repeat in 1 to 2 months and Vitamin D lab in 6 months.  Follow up PRN and as discussed at last office visit.

## 2019-10-09 ENCOUNTER — Ambulatory Visit: Payer: BC Managed Care – PPO | Admitting: Adult Health

## 2019-10-16 NOTE — Progress Notes (Signed)
Patient: Kirsten James Female    DOB: 1966/03/27   54 y.o.   MRN: [id number] Visit Date: 10/19/2019  Today's Provider: Marcille Buffy, FNP   Chief Complaint  Patient presents with  . Follow-up  . Hypertension   Subjective:       Hypertension, follow-up:  BP Readings from Last 3 Encounters:  10/19/19 131/83  09/15/19 138/90  09/04/19 (!) 167/113    She was last seen for hypertension 1 months ago.  BP at that visit was 138 90. Management since that visit includes Medication, diet .She reports poor compliance with treatment. She is having side effects. Headaches She is exercising. She is adherent to low salt diet.   Outside blood pressures are 140/90's. She is experiencing none.  Patient denies chest pain, chest pressure/discomfort, claudication, dyspnea, exertional chest pressure/discomfort, fatigue, irregular heart beat, lower extremity edema, near-syncope, orthopnea, palpitations, paroxysmal nocturnal dyspnea, syncope and tachypnea.   Cardiovascular risk factors include hypertension.  Use of agents associated with hypertension: decongestants.  She is still using Allegra-D occasionally.   She stopped her HCTZ after three weeks after frequent urination. She has stopped caffeine, doing nicotine patch. 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  Denies any suicidal thoughts or depression.   She has allegra D. She is advised not to take the decongestant.  Is advised to take regular Allegra.  Her hemoglobin was just mildly elevated at last visit in March, hematocrit was also elevated, she is on a plant-based diet eating lots of greens and vegetables.  No urinary symptoms.   Ultrasound - pelvis   PAP smear - Declines. She has no bleeding or spotting.  Has a history of abnormal Pap smears in the past, she has also had a cervical biopsy and they wanted to do a LEEP  procedure which she declined.  She desires not to have a Pap smear despite education.  Patient declines referral to gynecology for hormones and for further evaluation of previous abnormal Pap smear.  She declines Pap smear in the primary care office as well.  Reviewed patient's records that were received from her previous office, and was noted to have thickened endometrium and right irregular ovarian cyst on 09/04/2018, we will repeat this ultrasound for patient.  She denies any heart disease, patient had a note in her previous chart that she had heart disease, patient reported that her work-up was negative.  She denies any complaints or concerns, she denies lightheadedness dizziness or any radiating pain or chest pain.  She has no shortness of breath.  Is actually feeling better since quitting smoking, she is doing a nicotine patch but prefers to try Wellbutrin 150 extended release, she took this in the past and did well with it.  We will start at 150 mg extended release and can increase as needed and as able.   Patient  denies any fever, body aches,chills, rash, chest pain, shortness of breath, nausea, vomiting, or diarrhea.      Hypertension This is a chronic problem. The problem has been gradually improving since onset. The problem is controlled. Pertinent negatives include no anxiety, blurred vision, chest pain, headaches, malaise/fatigue, neck pain, orthopnea or palpitations. There are no associated agents to hypertension. Risk factors for coronary artery disease include stress. Past treatments include diuretics and lifestyle changes. The current treatment provides mild improvement. Compliance problems include medication side effects.  Allergies  Allergen Reactions  . Amoxicillin Hives  . Other     Allergy to pollen and mold- patient reports watery eyes, runny nose and sneezing as reaction  . Penicillins Hives     Current Outpatient Medications:  .  acetaminophen (TYLENOL) 325 MG  tablet, Take 650 mg by mouth every 6 (six) hours as needed., Disp: , Rfl:  .  azelastine (ASTELIN) 0.1 % nasal spray, Place into both nostrils 2 (two) times daily. Use in each nostril as directed, Disp: , Rfl:  .  Cholecalciferol (VITAMIN D3 PO), Take by mouth. 5000IU, Disp: , Rfl:  .  diltiazem (CARTIA XT) 180 MG 24 hr capsule, Cartia XT 180 mg capsule,extended release  Take 1 capsule every day by oral route., Disp: , Rfl:  .  fexofenadine-pseudoephedrine (ALLEGRA-D 24) 180-240 MG 24 hr tablet, Take 1 tablet by mouth daily., Disp: , Rfl:  .  fluticasone (FLONASE) 50 MCG/ACT nasal spray, Place into both nostrils daily., Disp: , Rfl:  .  Multiple Minerals (CALCIUM/MAGNESIUM/ZINC PO), Take by mouth., Disp: , Rfl:  .  Multiple Vitamin (MULTIVITAMIN) tablet, Take 1 tablet by mouth daily., Disp: , Rfl:  .  nicotine (NICODERM CQ - DOSED IN MG/24 HOURS) 14 mg/24hr patch, Place 14 mg onto the skin daily., Disp: , Rfl:  .  nitrofurantoin (MACRODANTIN) 50 MG capsule, Take 50 mg by mouth 4 (four) times daily. Patient reports PRN for UTI, Disp: , Rfl:  .  norethindrone (AYGESTIN) 5 MG tablet, norethindrone acetate 5 mg tablet, Disp: , Rfl:  .  Turmeric Curcumin 500 MG CAPS, Take by mouth., Disp: , Rfl:  .  EQL EVENING PRIMROSE OIL PO, Take by mouth. 1300mg  qd, Disp: , Rfl:  .  hydrochlorothiazide (HYDRODIURIL) 25 MG tablet, Take 1 tablet (25 mg total) by mouth daily. (Patient not taking: Reported on 10/19/2019), Disp: 30 tablet, Rfl: 0 .  Omega 3 1000 MG CAPS, Take by mouth., Disp: , Rfl:   Review of Systems  Constitutional: Negative.  Negative for malaise/fatigue.  HENT: Negative for sinus pressure.        Seasonal allergies, she takes Allegra-D intermittently.  She has relief of symptoms with this.  Eyes: Negative.  Negative for blurred vision.  Respiratory: Negative.   Cardiovascular: Negative for chest pain, palpitations, orthopnea and leg swelling.  Gastrointestinal: Negative.   Endocrine: Negative.     Genitourinary: Negative.   Musculoskeletal: Negative.  Negative for neck pain.  Skin: Negative.   Allergic/Immunologic: Negative.   Neurological: Negative for headaches.  Hematological: Negative.   Psychiatric/Behavioral: Negative.     Social History   Tobacco Use  . Smoking status: Former Smoker    Quit date: 10/12/2019    Years since quitting: 0.0  . Smokeless tobacco: Never Used  Substance Use Topics  . Alcohol use: Never      Objective:   BP 131/83   Pulse 77   Temp (!) 97.1 F (36.2 C) (Temporal)   Resp 16   Ht 5\' 9"  (1.753 m)   Wt 209 lb (94.8 kg)   SpO2 100%   BMI 30.86 kg/m  Vitals:   10/19/19 0934  BP: 131/83  Pulse: 77  Resp: 16  Temp: (!) 97.1 F (36.2 C)  TempSrc: Temporal  SpO2: 100%  Weight: 209 lb (94.8 kg)  Height: 5\' 9"  (1.753 m)  Body mass index is 30.86 kg/m.   Physical Exam Vitals reviewed.  Constitutional:      General: She is not in acute distress.  Appearance: Normal appearance. She is normal weight. She is not ill-appearing, toxic-appearing or diaphoretic.  HENT:     Head: Normocephalic and atraumatic.     Mouth/Throat:     Mouth: Mucous membranes are dry.     Pharynx: No oropharyngeal exudate or posterior oropharyngeal erythema.  Eyes:     General:        Right eye: No discharge.        Left eye: No discharge.  Cardiovascular:     Rate and Rhythm: Normal rate and regular rhythm.     Pulses: Normal pulses.     Heart sounds: Normal heart sounds. No murmur. No friction rub. No gallop.   Pulmonary:     Effort: Pulmonary effort is normal. No respiratory distress.     Breath sounds: Normal breath sounds. No stridor. No wheezing, rhonchi or rales.  Chest:     Chest wall: No tenderness.  Abdominal:     General: Abdomen is flat. There is no distension.     Palpations: Abdomen is soft. There is no mass.     Tenderness: There is no abdominal tenderness. There is no right CVA tenderness, left CVA tenderness, guarding or rebound.      Hernia: No hernia is present.  Musculoskeletal:        General: Normal range of motion.     Cervical back: Normal range of motion and neck supple.  Lymphadenopathy:     Cervical: No cervical adenopathy.  Skin:    General: Skin is warm and dry.  Neurological:     General: No focal deficit present.     Mental Status: She is alert and oriented to person, place, and time.  Psychiatric:        Mood and Affect: Mood normal.        Behavior: Behavior normal.        Thought Content: Thought content normal.        Judgment: Judgment normal.      No results found for any visits on 10/19/19.     Assessment & Plan      Body mass index (BMI) of 30.0-30.9 in adult  Encounter for smoking cessation counseling - Plan: Comprehensive Metabolic Panel (CMET), CBC with Differential/Platelet  Essential hypertension   Orders placed to have CBC and CMP done sometime around June or July 2021.    Orders Placed This Encounter  Procedures  . US Pelvic Complete With Transvaginal  . Comprehensive Metabolic Panel (CMET)  . CBC with Differential/Platelet   Meds ordered this encounter  Medications  . buPROPion (WELLBUTRIN XL) 150 MG 24 hr tablet    Sig: Take 1 tablet (150 mg total) by mouth daily.    Dispense:  90 tablet    Refill:  0  Denies any need for any additional refills she uses [facility] for most of her medications however wanted her Wellbutrin sent to [facility].  She will let me know when she needs her blood pressure medicine sent to [facility] as I am not the provider that originally prescribed, she will call the office within 1 month of running out of her medication to ensure adequate time for refill.  Discussed how to take medication above as far as possible side effects as well.  Blackbox warning discussed.  I do highly recommend referral to gynecology, however patient declines at this time, we will repeat her vaginal ultrasound given her history of thickened  endometrium and irregular ovarian cyst noted on her medical  records from 09/04/2018 unspecified laterality.  Patient will let us know if she would like to be seen at gynecology, also offered Pap smear in this office patient however declined.  To need recommendation of smoking cessation is highly recommended as far as for the health benefits.  Smoking cessation cessation was discussed over 10 minutes.  Patient verbalizes understanding and does desire to still continue to quit smoking.   Return in about 4 months (around 02/18/2020), or if symptoms worsen or fail to improve, for at any time for any worsening symptoms, Go to Emergency room/ urgent care if worse.     The entirety of the information documented in the History of Present Illness, Review of Systems and Physical Exam were personally obtained by me. Portions of this information were initially documented by the  Certified Medical Assistant whose name is documented in [place] and reviewed by me for thoroughness and accuracy.  I have personally performed the exam and reviewed the chart and it is accurate to the best of my knowledge.  Haematologist has been used and any errors in dictation or transcription are unintentional.  Kelby Aline. [place], [facility]

## 2019-10-19 ENCOUNTER — Ambulatory Visit: Payer: BC Managed Care – PPO | Admitting: Adult Health

## 2019-10-19 ENCOUNTER — Encounter: Payer: Self-pay | Admitting: Adult Health

## 2019-10-19 ENCOUNTER — Other Ambulatory Visit: Payer: Self-pay

## 2019-10-19 VITALS — BP 131/83 | HR 77 | Temp 97.1°F | Resp 16 | Ht 69.0 in | Wt 209.0 lb

## 2019-10-19 DIAGNOSIS — N83209 Unspecified ovarian cyst, unspecified side: Secondary | ICD-10-CM | POA: Diagnosis not present

## 2019-10-19 DIAGNOSIS — I10 Essential (primary) hypertension: Secondary | ICD-10-CM

## 2019-10-19 DIAGNOSIS — Z716 Tobacco abuse counseling: Secondary | ICD-10-CM

## 2019-10-19 DIAGNOSIS — Z6830 Body mass index (BMI) 30.0-30.9, adult: Secondary | ICD-10-CM | POA: Diagnosis not present

## 2019-10-19 DIAGNOSIS — R9389 Abnormal findings on diagnostic imaging of other specified body structures: Secondary | ICD-10-CM

## 2019-10-19 MED ORDER — BUPROPION HCL ER (XL) 150 MG PO TB24: 150.0000 mg | ORAL_TABLET | Freq: Every day | ORAL | 0 refills | Status: DC

## 2019-10-19 NOTE — Patient Instructions (Addendum)
Fat and Cholesterol Restricted Eating Plan Getting too much fat and cholesterol in your diet may cause health problems. Choosing the right foods helps keep your fat and cholesterol at normal levels. This can keep you from getting certain diseases. Your doctor may recommend an eating plan that includes:  Total fat: ______% or less of total calories a day.  Saturated fat: ______% or less of total calories a day.  Cholesterol: less than _________mg a day.  Fiber: ______g a day. What are tips for following this plan? Meal planning  At meals, divide your plate into four equal parts: ? Fill one-half of your plate with vegetables and green salads. ? Fill one-fourth of your plate with whole grains. ? Fill one-fourth of your plate with low-fat (lean) protein foods.  Eat fish that is high in omega-3 fats at least two times a week. This includes mackerel, tuna, sardines, and salmon.  Eat foods that are high in fiber, such as whole grains, beans, apples, broccoli, carrots, peas, and barley. General tips   Work with your doctor to lose weight if you need to.  Avoid: ? Foods with added sugar. ? Fried foods. ? Foods with partially hydrogenated oils.  Limit alcohol intake to no more than 1 drink a day for nonpregnant women and 2 drinks a day for men. One drink equals 12 oz of beer, 5 oz of wine, or 1 oz of hard liquor. Reading food labels  Check food labels for: ? Trans fats. ? Partially hydrogenated oils. ? Saturated fat (g) in each serving. ? Cholesterol (mg) in each serving. ? Fiber (g) in each serving.  Choose foods with healthy fats, such as: ? Monounsaturated fats. ? Polyunsaturated fats. ? Omega-3 fats.  Choose grain products that have whole grains. Look for the word "whole" as the first word in the ingredient list. Cooking  Cook foods using low-fat methods. These include baking, boiling, grilling, and broiling.  Eat more home-cooked foods. Eat at restaurants and  buffets less often.  Avoid cooking using saturated fats, such as butter, cream, palm oil, palm kernel oil, and coconut oil. Recommended foods  Fruits  All fresh, canned (in natural juice), or frozen fruits. Vegetables  Fresh or frozen vegetables (raw, steamed, roasted, or grilled). Green salads. Grains  Whole grains, such as whole wheat or whole grain breads, crackers, cereals, and pasta. Unsweetened oatmeal, bulgur, barley, quinoa, or brown rice. Corn or whole wheat flour tortillas. Meats and other protein foods  Ground beef (85% or leaner), grass-fed beef, or beef trimmed of fat. Skinless chicken or [place]. Ground chicken or [place]. Pork trimmed of fat. All fish and seafood. Egg whites. Dried beans, peas, or lentils. Unsalted nuts or seeds. Unsalted canned beans. Nut butters without added sugar or oil. Dairy  Low-fat or nonfat dairy products, such as skim or 1% milk, 2% or reduced-fat cheeses, low-fat and fat-free ricotta or cottage cheese, or plain low-fat and nonfat yogurt. Fats and oils  Tub margarine without trans fats. Light or reduced-fat mayonnaise and salad dressings. Avocado. Olive, canola, sesame, or safflower oils. The items listed above may not be a complete list of foods and beverages you can eat. Contact a dietitian for more information. Foods to avoid Fruits  Canned fruit in heavy syrup. Fruit in cream or butter sauce. Fried fruit. Vegetables  Vegetables cooked in cheese, cream, or butter sauce. Fried vegetables. Grains  White bread. White pasta. White rice. Cornbread. Bagels, pastries, and croissants. Crackers and snack foods that contain trans  fat and hydrogenated oils. Meats and other protein foods  Fatty cuts of meat. Ribs, chicken wings, bacon, sausage, bologna, salami, chitterlings, fatback, hot dogs, bratwurst, and packaged lunch meats. Liver and organ meats. Whole eggs and egg yolks. Chicken and [place] with skin. Fried meat. Dairy  Whole or 2% milk,  cream, half-and-half, and cream cheese. Whole milk cheeses. Whole-fat or sweetened yogurt. Full-fat cheeses. Nondairy creamers and whipped toppings. Processed cheese, cheese spreads, and cheese curds. Beverages  Alcohol. Sugar-sweetened drinks such as sodas, lemonade, and fruit drinks. Fats and oils  Butter, stick margarine, lard, shortening, ghee, or bacon fat. Coconut, palm kernel, and palm oils. Sweets and desserts  Corn syrup, sugars, honey, and molasses. Candy. Jam and jelly. Syrup. Sweetened cereals. Cookies, pies, cakes, donuts, muffins, and ice cream. The items listed above may not be a complete list of foods and beverages you should avoid. Contact a dietitian for more information. Summary  Choosing the right foods helps keep your fat and cholesterol at normal levels. This can keep you from getting certain diseases.  At meals, fill one-half of your plate with vegetables and green salads.  Eat high-fiber foods, like whole grains, beans, apples, carrots, peas, and barley.  Limit added sugar, saturated fats, alcohol, and fried foods. This information is not intended to replace advice given to you by your health care provider. Make sure you discuss any questions you have with your health care provider. Document Revised: 03/05/2018 Document Reviewed: 03/19/2017 Elsevier Patient Education  [place]. Bupropion extended-release tablets (Depression/Mood Disorders) What is this medicine? BUPROPION (byoo PROE pee on) is used to treat depression. This medicine may be used for other purposes; ask your health care provider or pharmacist if you have questions. COMMON BRAND NAME(S): Aplenzin, Budeprion XL, Forfivo XL, Wellbutrin XL What should I tell my health care provider before I take this medicine? They need to know if you have any of these conditions:  an eating disorder, such as anorexia or bulimia  bipolar disorder or psychosis  diabetes or high blood sugar, treated with  medication  glaucoma  head injury or brain tumor  heart disease, previous heart attack, or irregular heart beat  high blood pressure  kidney or liver disease  seizures (convulsions)  suicidal thoughts or a previous suicide attempt  Tourette's syndrome  weight loss  an unusual or allergic reaction to bupropion, other medicines, foods, dyes, or preservatives  breast-feeding  pregnant or trying to become pregnant How should I use this medicine? Take this medicine by mouth with a glass of water. Follow the directions on the prescription label. You can take it with or without food. If it upsets your stomach, take it with food. Do not crush, chew, or cut these tablets. This medicine is taken once daily at the same time each day. Do not take your medicine more often than directed. Do not stop taking this medicine suddenly except upon the advice of your doctor. Stopping this medicine too quickly may cause serious side effects or your condition may worsen. A special MedGuide will be given to you by the pharmacist with each prescription and refill. Be sure to read this information carefully each time. Talk to your pediatrician regarding the use of this medicine in children. Special care may be needed. Overdosage: If you think you have taken too much of this medicine contact a poison control center or emergency room at once. NOTE: This medicine is only for you. Do not share this medicine with others. What if  I miss a dose? If you miss a dose, skip the missed dose and take your next tablet at the regular time. Do not take double or extra doses. What may interact with this medicine? Do not take this medicine with any of the following medications:  linezolid  MAOIs like Azilect, Carbex, Eldepryl, Marplan, Nardil, and Parnate  methylene blue (injected into a vein)  other medicines that contain bupropion like Zyban This medicine may also interact with the following  medications:  alcohol  certain medicines for anxiety or sleep  certain medicines for blood pressure like metoprolol, propranolol  certain medicines for depression or psychotic disturbances  certain medicines for HIV or AIDS like efavirenz, lopinavir, nelfinavir, ritonavir  certain medicines for irregular heart beat like propafenone, flecainide  certain medicines for Parkinson's disease like amantadine, levodopa  certain medicines for seizures like carbamazepine, phenytoin, phenobarbital  cimetidine  clopidogrel  cyclophosphamide  digoxin  furazolidone  isoniazid  nicotine  orphenadrine  procarbazine  steroid medicines like prednisone or cortisone  stimulant medicines for attention disorders, weight loss, or to stay awake  tamoxifen  theophylline  thiotepa  ticlopidine  tramadol  warfarin This list may not describe all possible interactions. Give your health care provider a list of all the medicines, herbs, non-prescription drugs, or dietary supplements you use. Also tell them if you smoke, drink alcohol, or use illegal drugs. Some items may interact with your medicine. What should I watch for while using this medicine? Tell your doctor if your symptoms do not get better or if they get worse. Visit your doctor or healthcare provider for regular checks on your progress. Because it may take several weeks to see the full effects of this medicine, it is important to continue your treatment as prescribed by your doctor. This medicine may cause serious skin reactions. They can happen weeks to months after starting the medicine. Contact your healthcare provider right away if you notice fevers or flu-like symptoms with a rash. The rash may be red or purple and then turn into blisters or peeling of the skin. Or, you might notice a red rash with swelling of the face, lips or lymph nodes in your neck or under your arms. Patients and their families should watch out for new  or worsening thoughts of suicide or depression. Also watch out for sudden changes in feelings such as feeling anxious, agitated, panicky, irritable, hostile, aggressive, impulsive, severely restless, overly excited and hyperactive, or not being able to sleep. If this happens, especially at the beginning of treatment or after a change in dose, call your healthcare provider. Avoid alcoholic drinks while taking this medicine. Drinking large amounts of alcoholic beverages, using sleeping or anxiety medicines, or quickly stopping the use of these agents while taking this medicine may increase your risk for a seizure. Do not drive or use heavy machinery until you know how this medicine affects you. This medicine can impair your ability to perform these tasks. Do not take this medicine close to bedtime. It may prevent you from sleeping. Your mouth may get dry. Chewing sugarless gum or sucking hard candy, and drinking plenty of water may help. Contact your doctor if the problem does not go away or is severe. The tablet shell for some brands of this medicine does not dissolve. This is normal. The tablet shell may appear whole in the stool. This is not a cause for concern. What side effects may I notice from receiving this medicine? Side effects that you should report  to your doctor or health care professional as soon as possible:  allergic reactions like skin rash, itching or hives, swelling of the face, lips, or tongue  breathing problems  changes in vision  confusion  elevated mood, decreased need for sleep, racing thoughts, impulsive behavior  fast or irregular heartbeat  hallucinations, loss of contact with reality  increased blood pressure  rash, fever, and swollen lymph nodes  redness, blistering, peeling or loosening of the skin, including inside the mouth  seizures  suicidal thoughts or other mood changes  unusually weak or tired  vomiting Side effects that usually do not require  medical attention (report to your doctor or health care professional if they continue or are bothersome):  constipation  headache  loss of appetite  nausea  tremors  weight loss This list may not describe all possible side effects. Call your doctor for medical advice about side effects. You may report side effects to FDA at 1-800-FDA-1088. Where should I keep my medicine? Keep out of the reach of children. Store at room temperature between 15 and 30 degrees C (59 and 86 degrees F). Throw away any unused medicine after the expiration date. NOTE: This sheet is a summary. It may not cover all possible information. If you have questions about this medicine, talk to your doctor, pharmacist, or health care provider.  2020 Elsevier/Gold Standard (2018-09-25 13:45:31)

## 2019-10-27 ENCOUNTER — Ambulatory Visit
Admission: RE | Admit: 2019-10-27 | Discharge: 2019-10-27 | Disposition: A | Discharge status: Home / Self Care | Payer: BC Managed Care – PPO | Source: Ambulatory Visit | Attending: Adult Health | Admitting: Adult Health

## 2019-10-27 ENCOUNTER — Other Ambulatory Visit: Payer: Self-pay

## 2019-10-27 DIAGNOSIS — R9389 Abnormal findings on diagnostic imaging of other specified body structures: Secondary | ICD-10-CM | POA: Insufficient documentation

## 2019-10-27 DIAGNOSIS — N83209 Unspecified ovarian cyst, unspecified side: Secondary | ICD-10-CM | POA: Diagnosis present

## 2019-10-28 ENCOUNTER — Encounter: Payer: Self-pay | Admitting: Adult Health

## 2019-10-28 NOTE — Progress Notes (Signed)
Probable 4 mm uterine fibroid noted by radiologist as seen on CT scan, no ovarian cyst or endometrial abnormality seen.  Would recommend she follow with gynecology. Pap is due as well. Ok to place referral to location of her choice.

## 2020-01-01 ENCOUNTER — Encounter: Payer: Self-pay | Admitting: Adult Health

## 2020-01-07 ENCOUNTER — Telehealth: Payer: Self-pay | Admitting: Adult Health

## 2020-01-07 MED ORDER — DILTIAZEM HCL ER COATED BEADS 180 MG PO CP24: ORAL_CAPSULE | ORAL | 0 refills | Status: DC

## 2020-01-07 MED ORDER — BUPROPION HCL ER (XL) 300 MG PO TB24: 300.0000 mg | ORAL_TABLET | Freq: Every day | ORAL | 0 refills | Status: DC

## 2020-01-07 NOTE — Telephone Encounter (Signed)
Patient sent the following message below from my chart. Can you please call [facility] with my yearly Diltiazem 180  ER   and Wellbutrin.  I was taking 2 daily.   I slipped up again with vaping. Quit no problem til son had issues and it helps with menopause symptoms. In chart Wellbutrin was given as once a day, please advise if okay to refill prescription and increased sig? Kirsten James

## 2020-01-07 NOTE — Telephone Encounter (Signed)
OK to refill both and can change Wellbutrin to 300mg  XL daily

## 2020-01-08 ENCOUNTER — Other Ambulatory Visit: Payer: Self-pay

## 2020-01-08 MED ORDER — DILTIAZEM HCL ER COATED BEADS 180 MG PO CP24: ORAL_CAPSULE | ORAL | 1 refills | Status: DC

## 2020-01-08 NOTE — Telephone Encounter (Signed)
Ok to refill as requested 

## 2020-01-10 ENCOUNTER — Other Ambulatory Visit: Payer: Self-pay | Admitting: Adult Health

## 2020-02-18 ENCOUNTER — Encounter: Payer: Self-pay | Admitting: Adult Health

## 2020-02-18 ENCOUNTER — Other Ambulatory Visit: Payer: Self-pay

## 2020-02-18 ENCOUNTER — Ambulatory Visit: Payer: BC Managed Care – PPO | Admitting: Adult Health

## 2020-02-18 ENCOUNTER — Other Ambulatory Visit: Payer: Self-pay | Admitting: Adult Health

## 2020-02-18 VITALS — BP 151/91 | HR 73 | Temp 98.8°F | Resp 16 | Ht 69.0 in | Wt 217.0 lb

## 2020-02-18 DIAGNOSIS — I10 Essential (primary) hypertension: Secondary | ICD-10-CM | POA: Diagnosis not present

## 2020-02-18 DIAGNOSIS — Z6832 Body mass index (BMI) 32.0-32.9, adult: Secondary | ICD-10-CM

## 2020-02-18 DIAGNOSIS — Z5320 Procedure and treatment not carried out because of patient's decision for unspecified reasons: Secondary | ICD-10-CM

## 2020-02-18 DIAGNOSIS — L578 Other skin changes due to chronic exposure to nonionizing radiation: Secondary | ICD-10-CM

## 2020-02-18 DIAGNOSIS — Z1211 Encounter for screening for malignant neoplasm of colon: Secondary | ICD-10-CM

## 2020-02-18 DIAGNOSIS — Z1231 Encounter for screening mammogram for malignant neoplasm of breast: Secondary | ICD-10-CM

## 2020-02-18 MED ORDER — DICYCLOMINE HCL 10 MG PO CAPS: 10.0000 mg | ORAL_CAPSULE | Freq: Every day | ORAL | 0 refills | Status: DC | PRN

## 2020-02-18 MED ORDER — DILTIAZEM HCL ER COATED BEADS 240 MG PO CP24: ORAL_CAPSULE | ORAL | 0 refills | Status: DC

## 2020-02-18 NOTE — Patient Instructions (Addendum)
Fat and Cholesterol Restricted Eating Plan Getting too much fat and cholesterol in your diet may cause health problems. Choosing the right foods helps keep your fat and cholesterol at normal levels. This can keep you from getting certain diseases. Your doctor may recommend an eating plan that includes:  Total fat: ______% or less of total calories a day.  Saturated fat: ______% or less of total calories a day.  Cholesterol: less than _________mg a day.  Fiber: ______g a day. What are tips for following this plan? Meal planning  At meals, divide your plate into four equal parts: ? Fill one-half of your plate with vegetables and green salads. ? Fill one-fourth of your plate with whole grains. ? Fill one-fourth of your plate with low-fat (lean) protein foods.  Eat fish that is high in omega-3 fats at least two times a week. This includes mackerel, tuna, sardines, and salmon.  Eat foods that are high in fiber, such as whole grains, beans, apples, broccoli, carrots, peas, and barley. General tips   Work with your doctor to lose weight if you need to.  Avoid: ? Foods with added sugar. ? Fried foods. ? Foods with partially hydrogenated oils.  Limit alcohol intake to no more than 1 drink a day for nonpregnant women and 2 drinks a day for men. One drink equals 12 oz of beer, 5 oz of wine, or 1 oz of hard liquor. Reading food labels  Check food labels for: ? Trans fats. ? Partially hydrogenated oils. ? Saturated fat (g) in each serving. ? Cholesterol (mg) in each serving. ? Fiber (g) in each serving.  Choose foods with healthy fats, such as: ? Monounsaturated fats. ? Polyunsaturated fats. ? Omega-3 fats.  Choose grain products that have whole grains. Look for the word "whole" as the first word in the ingredient list. Cooking  Cook foods using low-fat methods. These include baking, boiling, grilling, and broiling.  Eat more home-cooked foods. Eat at restaurants and  buffets less often.  Avoid cooking using saturated fats, such as butter, cream, palm oil, palm kernel oil, and coconut oil. Recommended foods  Fruits  All fresh, canned (in natural juice), or frozen fruits. Vegetables  Fresh or frozen vegetables (raw, steamed, roasted, or grilled). Green salads. Grains  Whole grains, such as whole wheat or whole grain breads, crackers, cereals, and pasta. Unsweetened oatmeal, bulgur, barley, quinoa, or brown rice. Corn or whole wheat flour tortillas. Meats and other protein foods  Ground beef (85% or leaner), grass-fed beef, or beef trimmed of fat. Skinless chicken or [place]. Ground chicken or [place]. Pork trimmed of fat. All fish and seafood. Egg whites. Dried beans, peas, or lentils. Unsalted nuts or seeds. Unsalted canned beans. Nut butters without added sugar or oil. Dairy  Low-fat or nonfat dairy products, such as skim or 1% milk, 2% or reduced-fat cheeses, low-fat and fat-free ricotta or cottage cheese, or plain low-fat and nonfat yogurt. Fats and oils  Tub margarine without trans fats. Light or reduced-fat mayonnaise and salad dressings. Avocado. Olive, canola, sesame, or safflower oils. The items listed above may not be a complete list of foods and beverages you can eat. Contact a dietitian for more information. Foods to avoid Fruits  Canned fruit in heavy syrup. Fruit in cream or butter sauce. Fried fruit. Vegetables  Vegetables cooked in cheese, cream, or butter sauce. Fried vegetables. Grains  White bread. White pasta. White rice. Cornbread. Bagels, pastries, and croissants. Crackers and snack foods that contain trans  fat and hydrogenated oils. Meats and other protein foods  Fatty cuts of meat. Ribs, chicken wings, bacon, sausage, bologna, salami, chitterlings, fatback, hot dogs, bratwurst, and packaged lunch meats. Liver and organ meats. Whole eggs and egg yolks. Chicken and [place] with skin. Fried meat. Dairy  Whole or 2% milk,  cream, half-and-half, and cream cheese. Whole milk cheeses. Whole-fat or sweetened yogurt. Full-fat cheeses. Nondairy creamers and whipped toppings. Processed cheese, cheese spreads, and cheese curds. Beverages  Alcohol. Sugar-sweetened drinks such as sodas, lemonade, and fruit drinks. Fats and oils  Butter, stick margarine, lard, shortening, ghee, or bacon fat. Coconut, palm kernel, and palm oils. Sweets and desserts  Corn syrup, sugars, honey, and molasses. Candy. Jam and jelly. Syrup. Sweetened cereals. Cookies, pies, cakes, donuts, muffins, and ice cream. The items listed above may not be a complete list of foods and beverages you should avoid. Contact a dietitian for more information. Summary  Choosing the right foods helps keep your fat and cholesterol at normal levels. This can keep you from getting certain diseases.  At meals, fill one-half of your plate with vegetables and green salads.  Eat high-fiber foods, like whole grains, beans, apples, carrots, peas, and barley.  Limit added sugar, saturated fats, alcohol, and fried foods. This information is not intended to replace advice given to you by your health care provider. Make sure you discuss any questions you have with your health care provider. Document Revised: 03/05/2018 Document Reviewed: 03/19/2017 Elsevier Patient Education  [place].   Hypertension, Adult High blood pressure (hypertension) is when the force of blood pumping through the arteries is too strong. The arteries are the blood vessels that carry blood from the heart throughout the body. Hypertension forces the heart to work harder to pump blood and may cause arteries to become narrow or stiff. Untreated or uncontrolled hypertension can cause a heart attack, heart failure, a stroke, kidney disease, and other problems. A blood pressure reading consists of a higher number over a lower number. Ideally, your blood pressure should be below 120/80. The first  ("top") number is called the systolic pressure. It is a measure of the pressure in your arteries as your heart beats. The second ("bottom") number is called the diastolic pressure. It is a measure of the pressure in your arteries as the heart relaxes. What are the causes? The exact cause of this condition is not known. There are some conditions that result in or are related to high blood pressure. What increases the risk? Some risk factors for high blood pressure are under your control. The following factors may make you more likely to develop this condition:  Smoking.  Having type 2 diabetes mellitus, high cholesterol, or both.  Not getting enough exercise or physical activity.  Being overweight.  Having too much fat, sugar, calories, or salt (sodium) in your diet.  Drinking too much alcohol. Some risk factors for high blood pressure may be difficult or impossible to change. Some of these factors include:  Having chronic kidney disease.  Having a family history of high blood pressure.  Age. Risk increases with age.  Race. You may be at higher risk if you are African American.  Gender. Men are at higher risk than women before age 30. After age 55, women are at higher risk than men.  Having obstructive sleep apnea.  Stress. What are the signs or symptoms? High blood pressure may not cause symptoms. Very high blood pressure (hypertensive crisis) may cause:  Headache.  Anxiety.  Shortness of breath.  Nosebleed.  Nausea and vomiting.  Vision changes.  Severe chest pain.  Seizures. How is this diagnosed? This condition is diagnosed by measuring your blood pressure while you are seated, with your arm resting on a flat surface, your legs uncrossed, and your feet flat on the floor. The cuff of the blood pressure monitor will be placed directly against the skin of your upper arm at the level of your heart. It should be measured at least twice using the same arm. Certain  conditions can cause a difference in blood pressure between your right and left arms. Certain factors can cause blood pressure readings to be lower or higher than normal for a short period of time:  When your blood pressure is higher when you are in a health care provider's office than when you are at home, this is called white coat hypertension. Most people with this condition do not need medicines.  When your blood pressure is higher at home than when you are in a health care provider's office, this is called masked hypertension. Most people with this condition may need medicines to control blood pressure. If you have a high blood pressure reading during one visit or you have normal blood pressure with other risk factors, you may be asked to:  Return on a different day to have your blood pressure checked again.  Monitor your blood pressure at home for 1 week or longer. If you are diagnosed with hypertension, you may have other blood or imaging tests to help your health care provider understand your overall risk for other conditions. How is this treated? This condition is treated by making healthy lifestyle changes, such as eating healthy foods, exercising more, and reducing your alcohol intake. Your health care provider may prescribe medicine if lifestyle changes are not enough to get your blood pressure under control, and if:  Your systolic blood pressure is above 130.  Your diastolic blood pressure is above 80. Your personal target blood pressure may vary depending on your medical conditions, your age, and other factors. Follow these instructions at home: Eating and drinking   Eat a diet that is high in fiber and potassium, and low in sodium, added sugar, and fat. An example eating plan is called the DASH (Dietary Approaches to Stop Hypertension) diet. To eat this way: ? Eat plenty of fresh fruits and vegetables. Try to fill one half of your plate at each meal with fruits and  vegetables. ? Eat whole grains, such as whole-wheat pasta, brown rice, or whole-grain bread. Fill about one fourth of your plate with whole grains. ? Eat or drink low-fat dairy products, such as skim milk or low-fat yogurt. ? Avoid fatty cuts of meat, processed or cured meats, and poultry with skin. Fill about one fourth of your plate with lean proteins, such as fish, chicken without skin, beans, eggs, or tofu. ? Avoid pre-made and processed foods. These tend to be higher in sodium, added sugar, and fat.  Reduce your daily sodium intake. Most people with hypertension should eat less than 1,500 mg of sodium a day.  Do not drink alcohol if: ? Your health care provider tells you not to drink. ? You are pregnant, may be pregnant, or are planning to become pregnant.  If you drink alcohol: ? Limit how much you use to:  0-1 drink a day for women.  0-2 drinks a day for men. ? Be aware of how much alcohol is in your  drink. In the [place] one drink equals one 12 oz bottle of beer (355 mL), one 5 oz glass of wine (148 mL), or one 1 oz glass of hard liquor (44 mL). Lifestyle   Work with your health care provider to maintain a healthy body weight or to lose weight. Ask what an ideal weight is for you.  Get at least 30 minutes of exercise most days of the week. Activities may include walking, swimming, or biking.  Include exercise to strengthen your muscles (resistance exercise), such as Pilates or lifting weights, as part of your weekly exercise routine. Try to do these types of exercises for 30 minutes at least 3 days a week.  Do not use any products that contain nicotine or tobacco, such as cigarettes, e-cigarettes, and chewing tobacco. If you need help quitting, ask your health care provider.  Monitor your blood pressure at home as told by your health care provider.  Keep all follow-up visits as told by your health care provider. This is important. Medicines  Take over-the-counter and  prescription medicines only as told by your health care provider. Follow directions carefully. Blood pressure medicines must be taken as prescribed.  Do not skip doses of blood pressure medicine. Doing this puts you at risk for problems and can make the medicine less effective.  Ask your health care provider about side effects or reactions to medicines that you should watch for. Contact a health care provider if you:  Think you are having a reaction to a medicine you are taking.  Have headaches that keep coming back (recurring).  Feel dizzy.  Have swelling in your ankles.  Have trouble with your vision. Get help right away if you:  Develop a severe headache or confusion.  Have unusual weakness or numbness.  Feel faint.  Have severe pain in your chest or abdomen.  Vomit repeatedly.  Have trouble breathing. Summary  Hypertension is when the force of blood pumping through your arteries is too strong. If this condition is not controlled, it may put you at risk for serious complications.  Your personal target blood pressure may vary depending on your medical conditions, your age, and other factors. For most people, a normal blood pressure is less than 120/80.  Hypertension is treated with lifestyle changes, medicines, or a combination of both. Lifestyle changes include losing weight, eating a healthy, low-sodium diet, exercising more, and limiting alcohol. This information is not intended to replace advice given to you by your health care provider. Make sure you discuss any questions you have with your health care provider. Document Revised: 03/12/2018 Document Reviewed: 03/12/2018 Elsevier Patient Education  [place]. Doxazosin extended-release tablets What is this medicine? DOXAZOSIN (dox AY zoe sin) is an antihypertensive. It works by relaxing the blood vessels. It is used to treat benign prostatic hyperplasia (BPH) in men and to treat high blood pressure in both men  and women. This medicine may be used for other purposes; ask your health care provider or pharmacist if you have questions. COMMON BRAND NAME(S): Cardura XL What should I tell my health care provider before I take this medicine? They need to know if you have any of the following conditions:  kidney or liver disease  low blood pressure  an unusual or allergic reaction to doxazosin, other medicines, foods, dyes, or preservatives  pregnant or trying to get pregnant  breast-feeding How should I use this medicine? Take this medicine by mouth with a glass of water. Follow the directions  on the prescription label. Do not cut, crush, or chew. Take your doses at regular intervals. Do not take your medicine more often than directed. Do not stop taking except on the advice of your doctor or health care professional. Talk to your pediatrician regarding the use of this medicine in children. Special care may be needed. Overdosage: If you think you have taken too much of this medicine contact a poison control center or emergency room at once. NOTE: This medicine is only for you. Do not share this medicine with others. What if I miss a dose? If you miss a dose, take it as soon as you can. If it is almost time for your next dose, take only that dose. Do not take double or extra doses. What may interact with this medicine?  cimetidine  medicines for colds or hay fever  medicines for overactive bladder  sildenafil  tadalafil  vardenafil This list may not describe all possible interactions. Give your health care provider a list of all the medicines, herbs, non-prescription drugs, or dietary supplements you use. Also tell them if you smoke, drink alcohol, or use illegal drugs. Some items may interact with your medicine. What should I watch for while using this medicine? Visit your doctor or health care professional for regular checks on your progress. Check your blood pressure regularly. Ask your  doctor or health care professional what your blood pressure should be and when you should contact him or her. Drowsiness and dizziness are more likely to occur after the first dose, after an increase in dose, or during hot weather or exercise. These effects can decrease once your body adjusts to this medicine. Do not drive, use machinery, or do anything that needs mental alertness until you know how this drug affects you. Do not stand or sit up quickly, especially if you are an older patient. This reduces the risk of dizzy or fainting spells. Alcohol can make you more drowsy and dizzy. Avoid alcoholic drinks. Do not treat yourself for coughs, colds, or pain while you are taking this medicine without asking your doctor or health care professional for advice. Some ingredients may increase your blood pressure. Your mouth may get dry. Chewing sugarless gum or sucking hard candy, and drinking plenty of water may help. Contact your doctor if the problem does not go away or is severe. For males, contact your doctor or health care professional right away if you have an erection that lasts longer than 4 hours or if it becomes painful. This may be a sign of a serious problem and must be treated right away to prevent permanent damage. What side effects may I notice from receiving this medicine? Side effects that you should report to your doctor or health care professional as soon as possible:  allergic reactions like skin rash, itching or hives, swelling of the face, lips, or tongue  breathing problems  changes in vision  chest pain  fast or irregular heartbeat  feeling faint or lightheaded, falls  males: prolonged or painful erection  numbness in hands or feet  swelling of the legs or ankles  unusually weak or tired Side effects that usually do not require medical attention (report to your doctor or health care professional if they continue or are bothersome):  headache  nausea This list may  not describe all possible side effects. Call your doctor for medical advice about side effects. You may report side effects to FDA at 1-800-FDA-1088. Where should I keep my medicine? Keep  out of the reach of children. Store at room temperature between 15 and 30 degrees C (59 and 86 degrees F). Throw away any unused medicine after the expiration date. NOTE: This sheet is a summary. It may not cover all possible information. If you have questions about this medicine, talk to your doctor, pharmacist, or health care provider.  2020 Elsevier/[employer] Standard (2012-07-02 13:58:27)

## 2020-02-18 NOTE — Progress Notes (Signed)
Kirsten James,Kirsten James,acting as a scribe for [employer], Kirsten James.,have documented all relevant documentation on the behalf of Kirsten Buffy, Kirsten James,as directed by  Kirsten Buffy, Kirsten James while in the presence of Kirsten James, Kirsten James.   Established patient visit   Patient: Kirsten James   DOB: 12-21-1965   54 y.o. Female  MRN: [id number] Visit Date: 02/18/2020  Today's healthcare provider: Marcille Buffy, Kirsten James   Chief Complaint  Patient presents with  . Follow-up  . Hypertension  . Smoking Cession   Subjective    HPI   Hypertension, follow-up  BP Readings from Last 3 Encounters:  02/18/20 (!) 151/91  10/19/19 131/83  09/15/19 138/90   Wt Readings from Last 3 Encounters:  02/18/20 217 lb (98.4 kg)  10/19/19 209 lb (94.8 kg)  09/15/19 213 lb (96.6 kg)     She was last seen for hypertension 4 months ago.  BP at that visit was 131/83. Management since that visit includes; continue current medication.  She reports good compliance with treatment. She is not having side effects. none She is exercising. She is adherent to low salt diet.   Outside blood pressures are 138/86.  She is currently on Cartia  XT 180 mg 24 hr. Denies any missed doses takes everyday.  She reports previous  Stress testing was normal in past.Gandmother with stroke age 47 and CHF.  Declined seeing cardiology as she reports she was cleared in the past.  She has failed other antihypertensives such as ACE/ ARB's due to non emergent side effect she reports.  Denies any palpitations .  HCTZ she discontinued made her urinate too much she reports.  She does not smoke.  Use of agents associated with hypertension: none.  Gallbladder 2015 was removed.  Endoscopy was negative.  . She has been taking Bentyl occasional for esophageal spasms. Happens 1- 3 times per month she reports when eating salads mostly no issues with bread. Denies any hemoptysis, rectal bleeding or dark  stools.  Would like refill. Advise her that endoscopy referral is recommended. She declines.  She declines colonoscopy/ endoscopy, explained the risks versus benefits.    Patient  denies any fever, body aches,chills, rash, chest pain, shortness of breath, nausea, vomiting, or diarrhea.   Denies dizziness, lightheadedness, pre syncopal or syncopal episodes.   She has mammogram scheduled has had Covid vaccines and had to wait per guidelines.   --------------------------------------------------------------------  Smoking Cession follow up From 10/19/2019-given refill for Wellbutrin. She reports she is doing well with this.   Patient Active Problem List   Diagnosis Date Noted  . Encounter for screening mammogram for malignant neoplasm of breast 09/15/2019  . Body mass index (BMI) of 31.0-31.9 in adult 09/15/2019  . Leukocytes in urine 09/15/2019  . Menopause 09/15/2019  . Essential hypertension 09/15/2019  . Vitamin D deficiency 09/15/2019   Past Medical History:  Diagnosis Date  . Allergy   . Hypertension    Past Surgical History:  Procedure Laterality Date  . CHOLECYSTECTOMY    . L4-5 herniated disc repair    . PLANTAR FASCIA RELEASE     Social History   Tobacco Use  . Smoking status: Former Smoker    Quit date: 10/12/2019    Years since quitting: 0.3  . Smokeless tobacco: Never Used  Substance Use Topics  . Alcohol use: Never  . Drug use: Never   Social History   Socioeconomic History  . Marital status: Married    Spouse name: Not  on file  . Number of children: Not on file  . Years of education: Not on file  . Highest education level: Not on file  Occupational History  . Not on file  Tobacco Use  . Smoking status: Former Smoker    Quit date: 10/12/2019    Years since quitting: 0.3  . Smokeless tobacco: Never Used  Substance and Sexual Activity  . Alcohol use: Never  . Drug use: Never  . Sexual activity: Not Currently  Other Topics Concern  . Not on file    Social History Narrative  . Not on file   Social Determinants of Health   Financial Resource Strain:   . Difficulty of Paying Living Expenses:   Food Insecurity:   . Worried About Charity fundraiser in the Last Year:   . Arboriculturist in the Last Year:   Transportation Needs:   . Film/video editor (Medical):   Marland Kitchen Lack of Transportation (Non-Medical):   Physical Activity:   . Days of Exercise per Week:   . Minutes of Exercise per Session:   Stress:   . Feeling of Stress :   Social Connections:   . Frequency of Communication with Friends and Family:   . Frequency of Social Gatherings with Friends and Family:   . Attends Religious Services:   . Active Member of Clubs or Organizations:   . Attends Archivist Meetings:   Marland Kitchen Marital Status:   Intimate Partner Violence:   . Fear of Current or Ex-Partner:   . Emotionally Abused:   Marland Kitchen Physically Abused:   . Sexually Abused:    Family Status  Relation Name Status  . Mother  (Not Specified)  . Father  (Not Specified)  . MGM  (Not Specified)   Family History  Problem Relation Age of Onset  . Cancer Mother        Oral and lung  . Cancer Father        oral & larynx  . Alzheimer's disease Maternal Grandmother    Allergies  Allergen Reactions  . Amoxicillin Hives  . Other     Allergy to pollen and mold- patient reports watery eyes, runny nose and sneezing as reaction  . Penicillins Hives       Medications: Outpatient Medications Prior to Visit  Medication Sig  . BLACK COHOSH EXTRACT PO Take by mouth.  Marland Kitchen buPROPion (WELLBUTRIN XL) 300 MG 24 hr tablet Take 1 tablet (300 mg total) by mouth daily.  . Cholecalciferol (VITAMIN D3 PO) Take by mouth. 5000IU  . EQL EVENING PRIMROSE OIL PO Take by mouth. 1300mg  qd  . fluticasone (FLONASE) 50 MCG/ACT nasal spray Place into both nostrils daily.  . Multiple Vitamin (MULTIVITAMIN) tablet Take 1 tablet by mouth daily.  . nitrofurantoin (MACRODANTIN) 50 MG capsule Take  50 mg by mouth 4 (four) times daily. Patient reports PRN for UTI  . Omega 3 1000 MG CAPS Take by mouth.  . Turmeric Curcumin 500 MG CAPS Take by mouth.  . [DISCONTINUED] diltiazem (CARTIA XT) 180 MG 24 hr capsule Cartia XT 180 mg capsule,extended release Take 1 capsule every day by oral route.  Marland Kitchen acetaminophen (TYLENOL) 325 MG tablet Take 650 mg by mouth every 6 (six) hours as needed.  Marland Kitchen azelastine (ASTELIN) 0.1 % nasal spray Place into both nostrils 2 (two) times daily. Use in each nostril as directed (Patient not taking: Reported on 02/18/2020)  . fexofenadine-pseudoephedrine (ALLEGRA-D 24) 180-240 MG 24 hr tablet  Take 1 tablet by mouth daily.  . Multiple Minerals (CALCIUM/MAGNESIUM/ZINC PO) Take by mouth.  . nicotine (NICODERM CQ - DOSED IN MG/24 HOURS) 14 mg/24hr patch Place 14 mg onto the skin daily.  . norethindrone (AYGESTIN) 5 MG tablet norethindrone acetate 5 mg tablet  . [DISCONTINUED] hydrochlorothiazide (HYDRODIURIL) 25 MG tablet Take 1 tablet (25 mg total) by mouth daily. (Patient not taking: Reported on 10/19/2019)   No facility-administered medications prior to visit.    Review of Systems  Constitutional: Negative for appetite change, chills, fatigue and fever.  HENT: Negative.   Respiratory: Negative.  Negative for chest tightness and shortness of breath.   Cardiovascular: Negative.  Negative for chest pain and palpitations.  Gastrointestinal: Negative for abdominal pain, nausea and vomiting.  Genitourinary: Negative.   Musculoskeletal: Negative.   Neurological: Negative for dizziness and weakness.  Psychiatric/Behavioral: Negative.     Last CBC Lab Results  Component Value Date   WBC 7.1 09/21/2019   HGB 16.1 (H) 09/21/2019   HCT 48.3 (H) 09/21/2019   MCV 86 09/21/2019   MCH 28.7 09/21/2019   RDW 14.1 09/21/2019   PLT 296 59/56/3875   Last metabolic panel Lab Results  Component Value Date   GLUCOSE 95 09/21/2019   NA 139 09/21/2019   K 4.3 09/21/2019   CL 101  09/21/2019   CO2 23 09/21/2019   BUN 10 09/21/2019   CREATININE 0.67 09/21/2019   GFRNONAA 101 09/21/2019   GFRAA 116 09/21/2019   CALCIUM 9.8 09/21/2019   PROT 7.3 09/21/2019   ALBUMIN 4.7 09/21/2019   LABGLOB 2.6 09/21/2019   AGRATIO 1.8 09/21/2019   BILITOT 0.4 09/21/2019   ALKPHOS 99 09/21/2019   AST 21 09/21/2019   ALT 24 09/21/2019   Last lipids Lab Results  Component Value Date   CHOL 193 09/21/2019   HDL 67 09/21/2019   LDLCALC 109 (H) 09/21/2019   TRIG 96 09/21/2019   Last hemoglobin A1c No results found for: HGBA1C Last thyroid functions Lab Results  Component Value Date   TSH 1.340 09/21/2019   Last vitamin D Lab Results  Component Value Date   VD25OH 44.8 09/21/2019   Last vitamin B12 and Folate No results found for: VITAMINB12, FOLATE    Objective    BP (!) 151/91 (BP Location: Left Arm, Cuff Size: Large)   Pulse 73   Temp 98.8 F (37.1 C) (Oral)   Resp 16   Ht 5\' 9"  (1.753 m)   Wt 217 lb (98.4 kg)   SpO2 98%   BMI 32.05 kg/m     Physical Exam Vitals reviewed.  Constitutional:      General: She is not in acute distress.    Appearance: Normal appearance. She is not ill-appearing, toxic-appearing or diaphoretic.  HENT:     Head: Normocephalic and atraumatic.     Right Ear: External ear normal.     Left Ear: External ear normal.     Nose: Nose normal.     Mouth/Throat:     Pharynx: Oropharynx is clear.  Eyes:     Pupils: Pupils are equal, round, and reactive to light.  Neck:     Vascular: No carotid bruit.  Cardiovascular:     Rate and Rhythm: Normal rate and regular rhythm.     Pulses: Normal pulses.     Heart sounds: Normal heart sounds. No murmur heard.  No friction rub. No gallop.   Pulmonary:     Effort: Pulmonary effort is normal. No respiratory  distress.     Breath sounds: Normal breath sounds. No stridor. No wheezing, rhonchi or rales.  Chest:     Chest wall: No tenderness.  Abdominal:     General: There is no  distension.     Tenderness: There is no abdominal tenderness.  Musculoskeletal:        General: Normal range of motion.     Cervical back: Normal range of motion. No rigidity or tenderness.  Lymphadenopathy:     Cervical: No cervical adenopathy.  Skin:    General: Skin is warm.     Capillary Refill: Capillary refill takes less than 2 seconds.     Comments: Freckled skin, sun damage, right lower anterior arm small 2 mm tan area with raised flaking skin   Neurological:     General: No focal deficit present.     Mental Status: She is alert. Mental status is at baseline.  Psychiatric:        Mood and Affect: Mood normal.        Behavior: Behavior normal.        Thought Content: Thought content normal.        Judgment: Judgment normal.      No results found for any visits on 02/18/20.  Assessment & Plan    The primary encounter diagnosis was Essential hypertension. Diagnoses of Body mass index (BMI) of 32.0-32.9 in adult, Sun-damaged skin, Colonoscopy refused, and Screening for malignant neoplasm of colon were also pertinent to this visit.   Medications Discontinued During This Encounter  Medication Reason  . hydrochlorothiazide (HYDRODIURIL) 25 MG tablet Completed Course  . diltiazem (CARTIA XT) 180 MG 24 hr capsule Reorder   Meds ordered this encounter  Medications  . diltiazem (CARTIA XT) 240 MG 24 hr capsule    Sig: Cartia XT 180 mg capsule,extended release Take 1 capsule every day by oral route.    Dispense:  30 capsule    Refill:  0  . dicyclomine (BENTYL) 10 MG capsule    Sig: Take 1 capsule (10 mg total) by mouth daily as needed for spasms.    Dispense:  30 capsule    Refill:  0  recommend evaluation by gastroenterology and cardiology in past, patient declined.   Red Flags discussed. The patient was given clear instructions to go to ER or return to medical center if any red flags develop, symptoms do not improve, worsen or new problems develop. They verbalized  understanding.   Return in about 1 month (around 03/20/2020), or if symptoms worsen or fail to improve, for at any time for any worsening symptoms, Go to Emergency room/ urgent care if worse.      [place] Namira Rosekrans, Kirsten James, have reviewed all documentation for this visit. The documentation on 02/18/20 for the exam, diagnosis, procedures, and orders are all accurate and complete.   Kirsten James, Kirsten James [phone] (phone) [phone] (fax)  Kirsten James

## 2020-02-19 ENCOUNTER — Telehealth: Payer: Self-pay | Admitting: *Deleted

## 2020-02-19 LAB — COMPREHENSIVE METABOLIC PANEL
ALT: 24 IU/L (ref 0–32)
AST: 20 IU/L (ref 0–40)
Albumin/Globulin Ratio: 1.9 (ref 1.2–2.2)
Albumin: 4.7 g/dL (ref 3.8–4.9)
Alkaline Phosphatase: 115 IU/L (ref 48–121)
BUN/Creatinine Ratio: 16 (ref 9–23)
BUN: 13 mg/dL (ref 6–24)
Bilirubin Total: <0.2 mg/dL (ref 0.0–1.2)
CO2: 23 mmol/L (ref 20–29)
Calcium: 9.4 mg/dL (ref 8.7–10.2)
Chloride: 98 mmol/L (ref 96–106)
Creatinine, Ser: 0.8 mg/dL (ref 0.57–1.00)
GFR calc Af Amer: 97 mL/min/{1.73_m2} (ref >59)
GFR calc non Af Amer: 84 mL/min/{1.73_m2} (ref >59)
Globulin, Total: 2.5 g/dL (ref 1.5–4.5)
Glucose: 90 mg/dL (ref 65–99)
Potassium: 4.6 mmol/L (ref 3.5–5.2)
Sodium: 138 mmol/L (ref 134–144)
Total Protein: 7.2 g/dL (ref 6.0–8.5)

## 2020-02-19 LAB — CBC WITH DIFFERENTIAL/PLATELET
Basophils Absolute: 0.1 10*3/uL (ref 0.0–0.2)
Basos: 1 %
EOS (ABSOLUTE): 0.1 10*3/uL (ref 0.0–0.4)
Eos: 1 %
Hematocrit: 44.9 % (ref 34.0–46.6)
Hemoglobin: 14.6 g/dL (ref 11.1–15.9)
Immature Grans (Abs): 0 10*3/uL (ref 0.0–0.1)
Immature Granulocytes: 0 %
Lymphocytes Absolute: 3 10*3/uL (ref 0.7–3.1)
Lymphs: 39 %
MCH: 28.3 pg (ref 26.6–33.0)
MCHC: 32.5 g/dL (ref 31.5–35.7)
MCV: 87 fL (ref 79–97)
Monocytes Absolute: 0.6 10*3/uL (ref 0.1–0.9)
Monocytes: 8 %
Neutrophils Absolute: 4 10*3/uL (ref 1.4–7.0)
Neutrophils: 51 %
Platelets: 300 10*3/uL (ref 150–450)
RBC: 5.15 x10E6/uL (ref 3.77–5.28)
RDW: 13.9 % (ref 11.7–15.4)
WBC: 7.7 10*3/uL (ref 3.4–10.8)

## 2020-02-19 NOTE — Telephone Encounter (Signed)
-----   Message from Doreen Beam, [place] sent at 02/19/2020  8:14 AM EDT ----- CBC and CMP within normal limits

## 2020-02-19 NOTE — Telephone Encounter (Signed)
LMOVM for pt to return call. Okay for [facility] triage nurse to give patient results.  

## 2020-02-19 NOTE — Progress Notes (Signed)
CBC and CMP within normal limits.

## 2020-02-19 NOTE — Telephone Encounter (Signed)
Pt given lab results per notes of Laverna Peace, FNP on 02/19/20. Pt verbalized understanding.

## 2020-03-10 ENCOUNTER — Other Ambulatory Visit: Payer: Self-pay | Admitting: Adult Health

## 2020-03-10 ENCOUNTER — Ambulatory Visit
Admission: RE | Admit: 2020-03-10 | Discharge: 2020-03-10 | Disposition: A | Discharge status: Home / Self Care | Payer: BC Managed Care – PPO | Source: Ambulatory Visit | Attending: Adult Health | Admitting: Adult Health

## 2020-03-10 ENCOUNTER — Other Ambulatory Visit: Payer: Self-pay

## 2020-03-10 DIAGNOSIS — Z1231 Encounter for screening mammogram for malignant neoplasm of breast: Secondary | ICD-10-CM

## 2020-03-15 ENCOUNTER — Encounter: Payer: Self-pay | Admitting: Adult Health

## 2020-03-15 DIAGNOSIS — I10 Essential (primary) hypertension: Secondary | ICD-10-CM

## 2020-03-15 MED ORDER — DILTIAZEM HCL ER COATED BEADS 240 MG PO CP24
ORAL_CAPSULE | ORAL | 0 refills | Status: DC
Start: 2020-03-15 — End: 2020-03-16

## 2020-03-16 MED ORDER — DILTIAZEM HCL ER COATED BEADS 240 MG PO CP24: 240.0000 mg | ORAL_CAPSULE | Freq: Every day | ORAL | 1 refills | Status: DC

## 2020-03-16 NOTE — Addendum Note (Signed)
Addended by: Lelon Huh E on: 03/16/2020 11:19 AM   Modules accepted: Orders

## 2020-03-22 ENCOUNTER — Encounter: Payer: Self-pay | Admitting: Adult Health

## 2020-03-22 ENCOUNTER — Ambulatory Visit: Payer: BC Managed Care – PPO | Admitting: Adult Health

## 2020-03-23 ENCOUNTER — Other Ambulatory Visit: Payer: Self-pay | Admitting: Family Medicine

## 2020-03-23 DIAGNOSIS — N6310 Unspecified lump in the right breast, unspecified quadrant: Secondary | ICD-10-CM

## 2020-03-23 DIAGNOSIS — R928 Other abnormal and inconclusive findings on diagnostic imaging of breast: Secondary | ICD-10-CM

## 2020-03-24 ENCOUNTER — Ambulatory Visit: Payer: BC Managed Care – PPO | Admitting: Adult Health

## 2020-03-31 ENCOUNTER — Ambulatory Visit: Payer: BC Managed Care – PPO | Admitting: Adult Health

## 2020-03-31 ENCOUNTER — Ambulatory Visit
Admission: RE | Admit: 2020-03-31 | Discharge: 2020-03-31 | Disposition: A | Discharge status: Home / Self Care | Payer: BC Managed Care – PPO | Source: Ambulatory Visit | Attending: Family Medicine | Admitting: Family Medicine

## 2020-03-31 ENCOUNTER — Other Ambulatory Visit: Payer: Self-pay | Admitting: Family Medicine

## 2020-03-31 ENCOUNTER — Other Ambulatory Visit: Payer: Self-pay

## 2020-03-31 DIAGNOSIS — R928 Other abnormal and inconclusive findings on diagnostic imaging of breast: Secondary | ICD-10-CM

## 2020-03-31 DIAGNOSIS — N6310 Unspecified lump in the right breast, unspecified quadrant: Secondary | ICD-10-CM

## 2020-04-01 ENCOUNTER — Encounter: Payer: Self-pay | Admitting: Adult Health

## 2020-04-04 NOTE — Progress Notes (Signed)
Looks like Dr. B handled orders for abnormal mammogram  while I was out on leave. Please verify with patient.

## 2020-04-05 ENCOUNTER — Ambulatory Visit
Admission: RE | Admit: 2020-04-05 | Discharge: 2020-04-05 | Disposition: A | Discharge status: Home / Self Care | Payer: BC Managed Care – PPO | Source: Ambulatory Visit | Attending: Family Medicine | Admitting: Family Medicine

## 2020-04-05 ENCOUNTER — Other Ambulatory Visit: Payer: Self-pay | Admitting: Adult Health

## 2020-04-05 DIAGNOSIS — R928 Other abnormal and inconclusive findings on diagnostic imaging of breast: Secondary | ICD-10-CM | POA: Insufficient documentation

## 2020-04-05 DIAGNOSIS — N6310 Unspecified lump in the right breast, unspecified quadrant: Secondary | ICD-10-CM

## 2020-04-05 HISTORY — PX: BREAST BIOPSY: SHX20

## 2020-04-05 MED ORDER — DILTIAZEM HCL ER COATED BEADS 180 MG PO CP24
180.0000 mg | ORAL_CAPSULE | Freq: Every day | ORAL | 0 refills | Status: DC
Start: 2020-04-05 — End: 2020-04-20

## 2020-04-05 NOTE — Progress Notes (Signed)
Meds ordered this encounter  Medications  . diltiazem (CARTIA XT) 180 MG 24 hr capsule    Sig: Take 1 capsule (180 mg total) by mouth daily.    Dispense:  90 capsule    Refill:  0    Medications Discontinued During This Encounter  Medication Reason  . diltiazem (CARTIA XT) 240 MG 24 hr capsule Dose change

## 2020-04-06 ENCOUNTER — Encounter: Payer: Self-pay | Admitting: *Deleted

## 2020-04-06 DIAGNOSIS — C50911 Malignant neoplasm of unspecified site of right female breast: Secondary | ICD-10-CM

## 2020-04-06 NOTE — Progress Notes (Signed)
Received message from Electa Sniff, RN at [facility] that patient had been informed of her biopsy results and was ready for navigation.  Called patient to introduce navigation services.  I have scheduled her to see Dr. Luther Bradley for surgical consult on 04/12/20 @ 9:30 and Dr. Rogue Bussing at 2:00.  Will give her educational material at that time.  She is call with any questions or needs.

## 2020-04-12 ENCOUNTER — Inpatient Hospital Stay: Payer: BC Managed Care – PPO

## 2020-04-12 ENCOUNTER — Telehealth: Payer: Self-pay

## 2020-04-12 ENCOUNTER — Other Ambulatory Visit: Payer: Self-pay | Admitting: Surgery

## 2020-04-12 ENCOUNTER — Inpatient Hospital Stay: Payer: BC Managed Care – PPO | Admitting: Internal Medicine

## 2020-04-12 ENCOUNTER — Other Ambulatory Visit: Payer: Self-pay

## 2020-04-12 ENCOUNTER — Encounter: Payer: Self-pay | Admitting: Surgery

## 2020-04-12 ENCOUNTER — Ambulatory Visit: Payer: BC Managed Care – PPO | Admitting: Surgery

## 2020-04-12 VITALS — BP 158/105 | HR 88 | Temp 98.2°F | Resp 12 | Ht 69.0 in | Wt 215.0 lb

## 2020-04-12 DIAGNOSIS — Z853 Personal history of malignant neoplasm of breast: Secondary | ICD-10-CM | POA: Diagnosis not present

## 2020-04-12 DIAGNOSIS — C50211 Malignant neoplasm of upper-inner quadrant of right female breast: Secondary | ICD-10-CM

## 2020-04-12 LAB — SURGICAL PATHOLOGY

## 2020-04-12 NOTE — Telephone Encounter (Signed)
Patient notified Breast MRI 04/15/2020 @ 3 pm.

## 2020-04-12 NOTE — H&P (View-Only) (Signed)
Patient ID: Kirsten James, female   DOB: [birth year], 54 y.o.   MRN: [id number]  Chief Complaint: Right breast cancer, status post core biopsy via ultrasound x2.  Prognostic indicators pending.  History of Present Illness Kirsten James is a 54 y.o. female with 2 separate subcentimeter foci of invasive mammary carcinoma the right breast, one noted at 9:00, 1 at 1:00.  This was identified on screening mammography.  No prior mass palpable. She had 2 to 3 years of oral contraceptive in her early 56s.  Although she is currently menopausal she is utilize no hormone replacement therapy.  She has no family history of breast cancer.  She got menses at 10 to 54 years of age.  She reports her first pregnancy came at the age of 65.  Gravida 3 para 2 uncertain and what sequence for miscarriage came.  She did nurse her 2 children.  No palpable lump, no nipple discharge, no skin changes.  She does breast self-examinations monthly, and has had her annual mammograms.  Past Medical History Past Medical History:  Diagnosis Date  . Allergy   . Frequent urinary tract infections   . Hypertension       Past Surgical History:  Procedure Laterality Date  . BREAST BIOPSY Right 04/05/2020   [place] bx, Q shape marker 9:00, path pending  . BREAST BIOPSY Right 04/05/2020   [place] bx, x shape marker 1:00, path pending  . CHOLECYSTECTOMY    . L4-5 herniated disc repair    . NASAL SINUS SURGERY  2005  . PLANTAR FASCIA RELEASE      Allergies  Allergen Reactions  . Amoxicillin Hives  . Other     Allergy to pollen and mold- patient reports watery eyes, runny nose and sneezing as reaction  . Penicillins Hives    Current Outpatient Medications  Medication Sig Dispense Refill  . acetaminophen (TYLENOL) 325 MG tablet Take 650 mg by mouth every 6 (six) hours as needed.    Marland Kitchen azelastine (ASTELIN) 0.1 % nasal spray Place into both nostrils 2 (two) times daily. Use in each nostril as directed     . buPROPion  (WELLBUTRIN XL) 300 MG 24 hr tablet Take 1 tablet (300 mg total) by mouth daily. 90 tablet 0  . Cholecalciferol (VITAMIN D3 PO) Take by mouth. 5000IU    . dicyclomine (BENTYL) 10 MG capsule Take 1 capsule (10 mg total) by mouth daily as needed for spasms. 30 capsule 0  . diltiazem (CARTIA XT) 180 MG 24 hr capsule Take 1 capsule (180 mg total) by mouth daily. 90 capsule 0  . fexofenadine-pseudoephedrine (ALLEGRA-D 24) 180-240 MG 24 hr tablet Take 1 tablet by mouth daily.    . fluticasone (FLONASE) 50 MCG/ACT nasal spray Place into both nostrils daily.    . Multiple Vitamin (MULTIVITAMIN) tablet Take 1 tablet by mouth daily.    . nitrofurantoin (MACRODANTIN) 50 MG capsule Take 50 mg by mouth 4 (four) times daily. Patient reports PRN for UTI    . Omega 3 1000 MG CAPS Take by mouth.    . Turmeric Curcumin 500 MG CAPS Take by mouth.    . Multiple Minerals (CALCIUM/MAGNESIUM/ZINC PO) Take by mouth.     No current facility-administered medications for this visit.    Family History Family History  Problem Relation Age of Onset  . Cancer Mother        Oral and lung  . Cancer Father        oral & larynx  .  Alzheimer's disease Maternal Grandmother       Social History Social History   Tobacco Use  . Smoking status: Former Smoker    Quit date: 10/12/2019    Years since quitting: 0.5  . Smokeless tobacco: Never Used  Substance Use Topics  . Alcohol use: Never  . Drug use: Never        Review of Systems  Constitutional: Positive for malaise/fatigue.  HENT: Negative.   Eyes: Negative.   Respiratory: Negative.   Cardiovascular: Negative for chest pain (History of hypertension.).  Gastrointestinal: Negative.   Genitourinary: Negative for dysuria (History of frequent urinary tract infections.), frequency and urgency.  Skin: Negative.   Neurological: Negative.   Psychiatric/Behavioral: Negative.       Physical Exam Blood pressure (!) 158/105, pulse 88, temperature 98.2 F (36.8  C), temperature source Oral, resp. rate 12, height _0  (1.753 m), weight 215 lb (97.5 kg), SpO2 96 %. Last Weight  Most recent update: 04/12/2020  9:35 AM   Weight  97.5 kg (215 lb)            CONSTITUTIONAL: Well developed, and nourished, appropriately responsive and aware without distress.   EYES: Sclera non-icteric.   EARS, NOSE, MOUTH AND THROAT: Mask worn.    Hearing is intact to voice.  NECK: Trachea is midline, and there is no jugular venous distension.  LYMPH NODES:  Lymph nodes in the neck are not enlarged. RESPIRATORY:  Lungs are clear, and breath sounds are equal bilaterally. Normal respiratory effort without pathologic use of accessory muscles. CARDIOVASCULAR: Heart is regular in rate and rhythm. GI: The abdomen is soft, nontender, and nondistended.  GU: Bilateral breast exam remarkable only for ecchymotic changes of the right breast.  No dominant, nor suspicious nodularity or palpable masses appreciated. MUSCULOSKELETAL:  Symmetrical muscle tone appreciated in all four extremities.    SKIN: Skin turgor is normal. No pathologic skin lesions appreciated.  NEUROLOGIC:  Motor and sensation appear grossly normal.  Cranial nerves are grossly without defect. PSYCH:  Alert and oriented to person, place and time. Affect is appropriate for situation.  Data Reviewed I have personally reviewed what is currently available of the patient's imaging, recent labs and medical records.   Labs:  CBC Latest Ref Rng & Units 02/18/2020 09/21/2019  WBC 3.4 - 10.8 x10E3/uL 7.7 7.1  Hemoglobin 11.1 - 15.9 g/dL 14.6 16.1(H)  Hematocrit 34.0 - 46.6 % 44.9 48.3(H)  Platelets 150 - 450 x10E3/uL 300 296   CMP Latest Ref Rng & Units 02/18/2020 09/21/2019  Glucose 65 - 99 mg/dL 90 95  BUN 6 - 24 mg/dL 13 10  Creatinine 0.57 - 1.00 mg/dL 0.80 0.67  Sodium 134 - 144 mmol/L 138 139  Potassium 3.5 - 5.2 mmol/L 4.6 4.3  Chloride 96 - 106 mmol/L 98 101  CO2 20 - 29 mmol/L 23 23  Calcium 8.7 - 10.2 mg/dL 9.4  9.8  Total Protein 6.0 - 8.5 g/dL 7.2 7.3  Total Bilirubin 0.0 - 1.2 mg/dL <0.2 0.4  Alkaline Phos 48 - 121 IU/L 115 99  AST 0 - 40 IU/L 20 21  ALT 0 - 32 IU/L 24 24    SURGICAL PATHOLOGY  CASE: [phone]  PATIENT: Fredia Beets  Surgical Pathology Report      Specimen Submitted:  A. Right breast, 1:00; US biopsy  B. Right breast, 9:00; US biopsy   Clinical History: 1) X clip within 0.6 cm mass UIQ R breast 1:00  position and 2) Q  clip within 0.8 cm mass outer R breast 9:00 position.       DIAGNOSIS:  A. RIGHT BREAST, UIQ 1:00; ULTRASOUND-GUIDED BIOPSY:  - INVASIVE MAMMARY CARCINOMA, NO SPECIAL TYPE.   Size of invasive carcinoma: 6 mm in this sample  Histologic grade of invasive carcinoma: Grade 2            Glandular/tubular differentiation score: 3            Nuclear pleomorphism score: 2            Mitotic rate score: 1            Total score: 6  Ductal carcinoma in situ: Present, cribriform  Lymphovascular invasion: Not identified   Comment:  The definitive grade will be assigned on the excisional specimen.  ER/PR/HER2 immunohistochemistry will be performed on block A1, with  reflex to [place] for HER2 2+. The results will be reported in an addendum.   B. RIGHT BREAST, OUTER 9:00; ULTRASOUND-GUIDED BIOPSY:  - INVASIVE MAMMARY CARCINOMA, AS DESCRIBED ABOVE (SEE PART A).   GROSS DESCRIPTION:  A. Labeled: Mass upper inner right breast 1 o'clock position  Received: In formalin  Time/date in fixative: 10:50 AM on 04/05/2020  Cold ischemic time: Not provided  Total fixation time: 7 hours  Core pieces: 3  Size: 1-1.7 cm in length and 0.3 cm in diameter  Description: Pale yellow cylindrically shaped fibrofatty tissue  fragments  Ink color: Blue  Entirely submitted in 1 cassette.   B. Labeled: Mass outer right breast 9 o'clock position  Received: In formalin  Time/date in fixative: 10:58 AM on 04/05/2020   Cold ischemic time: Not provided  Total fixation time: 7 hours  Core pieces: 3  Size: 0.8-1.5 cm in length and 0.3 cm in diameter  Description: Pink-yellow to red cylindrically shaped fibrofatty tissue  fragments  Ink color: Black  Entirely submitted in 1 cassette.   Final Diagnosis performed by Betsy Pries, MD.      Imaging: Radiology review:   CLINICAL DATA:  Screening recall for possible right breast distortion.  EXAM: DIGITAL DIAGNOSTIC UNILATERAL RIGHT MAMMOGRAM WITH TOMO AND CAD; ULTRASOUND RIGHT BREAST LIMITED  COMPARISON:  Previous exams.  ACR Breast Density Category c: The breast tissue is heterogeneously dense, which may obscure small masses.  FINDINGS: Spot compression tomograms were performed of the right breast. There is a persistent area of distortion within adjacent calcifications within the slightly upper outer right breast. There is a persistent area of distortion in the upper slightly inner right breast.  Mammographic images were processed with CAD.  Targeted ultrasound of the right breast was performed. There is an irregular hypoechoic mass at 1 o'clock 7 cm from nipple measuring 0.5 x 0.6 x 0.6 cm. This corresponds well with the distortion seen in the upper inner right breast at mammography. There is an irregular hypoechoic mass in the right breast at 9 o'clock 3 cm from nipple with an associated calcification measuring 0.7 x 0.7 x 0.8 cm. This corresponds well with the distortion seen in the upper-outer right breast at mammography.  No lymphadenopathy seen in the right axilla.  IMPRESSION: 1. Suspicious 0.6 cm mass in the right breast at the 1 o'clock position.  2. Suspicious 0.8 cm mass in the right breast at the 9 o'clock position.  RECOMMENDATION: Recommend ultrasound-guided biopsy of the 2 masses in the right breast. This will be scheduled for the patient.  I have discussed the findings and recommendations with the  patient.  If applicable, a reminder letter will be sent to the patient regarding the next appointment.  BI-RADS CATEGORY  4: Suspicious.   Electronically Signed   By: Everlean Alstrom M.D.   On: 03/31/2020 11:19 Within last 24 hrs: No results found.  Assessment    Multicentric right breast carcinoma. Prognostic indicators pending. Patient Active Problem List   Diagnosis Date Noted  . Encounter for screening mammogram for malignant neoplasm of breast 09/15/2019  . Body mass index (BMI) of 31.0-31.9 in adult 09/15/2019  . Leukocytes in urine 09/15/2019  . Menopause 09/15/2019  . Essential hypertension 09/15/2019  . Vitamin D deficiency 09/15/2019    Plan    She is veheminently opposed to considering a mastectomy at this time. My understanding of the gold standard remains that multicentric disease requires mastectomy.  However if it means this patient refusing treatment altogether, and she understands the risks involved with pursuing breast conservation treatment, and if there is a radiation oncologist willing to treat her, I would be willing to participate, however it would be better if we were part of a trial. She seems quite headstrong, and I doubt she will be easily persuaded to consider mastectomy at this time. We will pursue consultation with radiation oncology, Dr. Baruch Gouty.  She is to see her oncologist,  B.G. I believe tomorrow.  We will likely discuss her before breast conference prior to any surgery however she is quite anxious to proceed with her treatment.   Face-to-face time spent with the patient and accompanying care providers(if present) was 45 minutes, with more than 50% of the time spent counseling, educating, and coordinating care of the patient.      Ronny Bacon M.D., FACS 04/12/2020, 10:12 AM

## 2020-04-12 NOTE — Telephone Encounter (Signed)
Per Dr.Rodenberg- spoke with patient- she is ok to move forward with Breast MRI to be sure of any subtle areas of concern prior to treatment.   Breast MRI order has been placed per -Dr.Rodenberg- staff message was sent to Dollene Cleveland to schedule and to notify Freda Munro when scheduled.

## 2020-04-12 NOTE — Patient Instructions (Signed)
Referral has been sent to Dr.Chrystal. Someone form their office will call to schedule your appointment within 5-7 days. If you have not heard from anyone within the time frame above please call our office so we can check on this for you.  Our surgery scheuler will contact you within 24-48 hours to schedule your surgery. Please have the [facility] surgery sheet available when speaking with her.

## 2020-04-12 NOTE — Progress Notes (Signed)
Patient ID: Kirsten James, female   DOB: [birth year], 54 y.o.   MRN: [id number]  Chief Complaint: Right breast cancer, status post core biopsy via ultrasound x2.  Prognostic indicators pending.  History of Present Illness Kirsten James is a 54 y.o. female with 2 separate subcentimeter foci of invasive mammary carcinoma the right breast, one noted at 9:00, 1 at 1:00.  This was identified on screening mammography.  No prior mass palpable. She had 2 to 3 years of oral contraceptive in her early 93s.  Although she is currently menopausal she is utilize no hormone replacement therapy.  She has no family history of breast cancer.  She got menses at 3 to 54 years of age.  She reports her first pregnancy came at the age of 40.  Gravida 3 para 2 uncertain and what sequence for miscarriage came.  She did nurse her 2 children.  No palpable lump, no nipple discharge, no skin changes.  She does breast self-examinations monthly, and has had her annual mammograms.  Past Medical History Past Medical History:  Diagnosis Date   Allergy    Frequent urinary tract infections    Hypertension       Past Surgical History:  Procedure Laterality Date   BREAST BIOPSY Right 04/05/2020   [place] bx, Q shape marker 9:00, path pending   BREAST BIOPSY Right 04/05/2020   [place] bx, x shape marker 1:00, path pending   CHOLECYSTECTOMY     L4-5 herniated disc repair     NASAL SINUS SURGERY  2005   PLANTAR FASCIA RELEASE      Allergies  Allergen Reactions   Amoxicillin Hives   Other     Allergy to pollen and mold- patient reports watery eyes, runny nose and sneezing as reaction   Penicillins Hives    Current Outpatient Medications  Medication Sig Dispense Refill   acetaminophen (TYLENOL) 325 MG tablet Take 650 mg by mouth every 6 (six) hours as needed.     azelastine (ASTELIN) 0.1 % nasal spray Place into both nostrils 2 (two) times daily. Use in each nostril as directed      buPROPion  (WELLBUTRIN XL) 300 MG 24 hr tablet Take 1 tablet (300 mg total) by mouth daily. 90 tablet 0   Cholecalciferol (VITAMIN D3 PO) Take by mouth. 5000IU     dicyclomine (BENTYL) 10 MG capsule Take 1 capsule (10 mg total) by mouth daily as needed for spasms. 30 capsule 0   diltiazem (CARTIA XT) 180 MG 24 hr capsule Take 1 capsule (180 mg total) by mouth daily. 90 capsule 0   fexofenadine-pseudoephedrine (ALLEGRA-D 24) 180-240 MG 24 hr tablet Take 1 tablet by mouth daily.     fluticasone (FLONASE) 50 MCG/ACT nasal spray Place into both nostrils daily.     Multiple Vitamin (MULTIVITAMIN) tablet Take 1 tablet by mouth daily.     nitrofurantoin (MACRODANTIN) 50 MG capsule Take 50 mg by mouth 4 (four) times daily. Patient reports PRN for UTI     Omega 3 1000 MG CAPS Take by mouth.     Turmeric Curcumin 500 MG CAPS Take by mouth.     Multiple Minerals (CALCIUM/MAGNESIUM/ZINC PO) Take by mouth.     No current facility-administered medications for this visit.    Family History Family History  Problem Relation Age of Onset   Cancer Mother        Oral and lung   Cancer Father        oral & larynx  Alzheimer's disease Maternal Grandmother       Social History Social History   Tobacco Use  . Smoking status: Former Smoker    Quit date: 10/12/2019    Years since quitting: 0.5  . Smokeless tobacco: Never Used  Substance Use Topics  . Alcohol use: Never  . Drug use: Never        Review of Systems  Constitutional: Positive for malaise/fatigue.  HENT: Negative.   Eyes: Negative.   Respiratory: Negative.   Cardiovascular: Negative for chest pain (History of hypertension.).  Gastrointestinal: Negative.   Genitourinary: Negative for dysuria (History of frequent urinary tract infections.), frequency and urgency.  Skin: Negative.   Neurological: Negative.   Psychiatric/Behavioral: Negative.       Physical Exam Blood pressure (!) 158/105, pulse 88, temperature 98.2 F (36.8  C), temperature source Oral, resp. rate 12, height 5' 9" (1.753 m), weight 215 lb (97.5 kg), SpO2 96 %. Last Weight  Most recent update: 04/12/2020  9:35 AM   Weight  97.5 kg (215 lb)            CONSTITUTIONAL: Well developed, and nourished, appropriately responsive and aware without distress.   EYES: Sclera non-icteric.   EARS, NOSE, MOUTH AND THROAT: Mask worn.    Hearing is intact to voice.  NECK: Trachea is midline, and there is no jugular venous distension.  LYMPH NODES:  Lymph nodes in the neck are not enlarged. RESPIRATORY:  Lungs are clear, and breath sounds are equal bilaterally. Normal respiratory effort without pathologic use of accessory muscles. CARDIOVASCULAR: Heart is regular in rate and rhythm. GI: The abdomen is soft, nontender, and nondistended.  GU: Bilateral breast exam remarkable only for ecchymotic changes of the right breast.  No dominant, nor suspicious nodularity or palpable masses appreciated. MUSCULOSKELETAL:  Symmetrical muscle tone appreciated in all four extremities.    SKIN: Skin turgor is normal. No pathologic skin lesions appreciated.  NEUROLOGIC:  Motor and sensation appear grossly normal.  Cranial nerves are grossly without defect. PSYCH:  Alert and oriented to person, place and time. Affect is appropriate for situation.  Data Reviewed I have personally reviewed what is currently available of the patient's imaging, recent labs and medical records.   Labs:  CBC Latest Ref Rng & Units 02/18/2020 09/21/2019  WBC 3.4 - 10.8 x10E3/uL 7.7 7.1  Hemoglobin 11.1 - 15.9 g/dL 14.6 16.1(H)  Hematocrit 34.0 - 46.6 % 44.9 48.3(H)  Platelets 150 - 450 x10E3/uL 300 296   CMP Latest Ref Rng & Units 02/18/2020 09/21/2019  Glucose 65 - 99 mg/dL 90 95  BUN 6 - 24 mg/dL 13 10  Creatinine 0.57 - 1.00 mg/dL 0.80 0.67  Sodium 134 - 144 mmol/L 138 139  Potassium 3.5 - 5.2 mmol/L 4.6 4.3  Chloride 96 - 106 mmol/L 98 101  CO2 20 - 29 mmol/L 23 23  Calcium 8.7 - 10.2 mg/dL 9.4  9.8  Total Protein 6.0 - 8.5 g/dL 7.2 7.3  Total Bilirubin 0.0 - 1.2 mg/dL <0.2 0.4  Alkaline Phos 48 - 121 IU/L 115 99  AST 0 - 40 IU/L 20 21  ALT 0 - 32 IU/L 24 24    SURGICAL PATHOLOGY  CASE: [id number]  PATIENT: Kirsten James  Surgical Pathology Report      Specimen Submitted:  A. Right breast, 1:00; US biopsy  B. Right breast, 9:00; US biopsy   Clinical History: 1) X clip within 0.6 cm mass UIQ R breast 1:00  position and 2) Q   clip within 0.8 cm mass outer R breast 9:00 position.       DIAGNOSIS:  A. RIGHT BREAST, UIQ 1:00; ULTRASOUND-GUIDED BIOPSY:  - INVASIVE MAMMARY CARCINOMA, NO SPECIAL TYPE.   Size of invasive carcinoma: 6 mm in this sample  Histologic grade of invasive carcinoma: Grade 2            Glandular/tubular differentiation score: 3            Nuclear pleomorphism score: 2            Mitotic rate score: 1            Total score: 6  Ductal carcinoma in situ: Present, cribriform  Lymphovascular invasion: Not identified   Comment:  The definitive grade will be assigned on the excisional specimen.  ER/PR/HER2 immunohistochemistry will be performed on block A1, with  reflex to [place] for HER2 2+. The results will be reported in an addendum.   B. RIGHT BREAST, OUTER 9:00; ULTRASOUND-GUIDED BIOPSY:  - INVASIVE MAMMARY CARCINOMA, AS DESCRIBED ABOVE (SEE PART A).   GROSS DESCRIPTION:  A. Labeled: Mass upper inner right breast 1 o'clock position  Received: In formalin  Time/date in fixative: 10:50 AM on 04/05/2020  Cold ischemic time: Not provided  Total fixation time: 7 hours  Core pieces: 3  Size: 1-1.7 cm in length and 0.3 cm in diameter  Description: Pale yellow cylindrically shaped fibrofatty tissue  fragments  Ink color: Blue  Entirely submitted in 1 cassette.   B. Labeled: Mass outer right breast 9 o'clock position  Received: In formalin  Time/date in fixative: 10:58 AM on 04/05/2020   Cold ischemic time: Not provided  Total fixation time: 7 hours  Core pieces: 3  Size: 0.8-1.5 cm in length and 0.3 cm in diameter  Description: Pink-yellow to red cylindrically shaped fibrofatty tissue  fragments  Ink color: Black  Entirely submitted in 1 cassette.   Final Diagnosis performed by Betsy Pries, MD.      Imaging: Radiology review:   CLINICAL DATA:  Screening recall for possible right breast distortion.  EXAM: DIGITAL DIAGNOSTIC UNILATERAL RIGHT MAMMOGRAM WITH TOMO AND CAD; ULTRASOUND RIGHT BREAST LIMITED  COMPARISON:  Previous exams.  ACR Breast Density Category c: The breast tissue is heterogeneously dense, which may obscure small masses.  FINDINGS: Spot compression tomograms were performed of the right breast. There is a persistent area of distortion within adjacent calcifications within the slightly upper outer right breast. There is a persistent area of distortion in the upper slightly inner right breast.  Mammographic images were processed with CAD.  Targeted ultrasound of the right breast was performed. There is an irregular hypoechoic mass at 1 o'clock 7 cm from nipple measuring 0.5 x 0.6 x 0.6 cm. This corresponds well with the distortion seen in the upper inner right breast at mammography. There is an irregular hypoechoic mass in the right breast at 9 o'clock 3 cm from nipple with an associated calcification measuring 0.7 x 0.7 x 0.8 cm. This corresponds well with the distortion seen in the upper-outer right breast at mammography.  No lymphadenopathy seen in the right axilla.  IMPRESSION: 1. Suspicious 0.6 cm mass in the right breast at the 1 o'clock position.  2. Suspicious 0.8 cm mass in the right breast at the 9 o'clock position.  RECOMMENDATION: Recommend ultrasound-guided biopsy of the 2 masses in the right breast. This will be scheduled for the patient.  I have discussed the findings and recommendations with the  patient.  If applicable, a reminder letter will be sent to the patient regarding the next appointment.  BI-RADS CATEGORY  4: Suspicious.   Electronically Signed   By: Everlean Alstrom M.D.   On: 03/31/2020 11:19 Within last 24 hrs: No results found.  Assessment    Multicentric right breast carcinoma. Prognostic indicators pending. Patient Active Problem List   Diagnosis Date Noted   Encounter for screening mammogram for malignant neoplasm of breast 09/15/2019   Body mass index (BMI) of 31.0-31.9 in adult 09/15/2019   Leukocytes in urine 09/15/2019   Menopause 09/15/2019   Essential hypertension 09/15/2019   Vitamin D deficiency 09/15/2019    Plan    She is veheminently opposed to considering a mastectomy at this time. My understanding of the gold standard remains that multicentric disease requires mastectomy.  However if it means this patient refusing treatment altogether, and she understands the risks involved with pursuing breast conservation treatment, and if there is a radiation oncologist willing to treat her, I would be willing to participate, however it would be better if we were part of a trial. She seems quite headstrong, and I doubt she will be easily persuaded to consider mastectomy at this time. We will pursue consultation with radiation oncology, Dr. Baruch Gouty.  She is to see her oncologist,  B.G. I believe tomorrow.  We will likely discuss her before breast conference prior to any surgery however she is quite anxious to proceed with her treatment.   Face-to-face time spent with the patient and accompanying care providers(if present) was 45 minutes, with more than 50% of the time spent counseling, educating, and coordinating care of the patient.      Ronny Bacon M.D., FACS 04/12/2020, 10:12 AM

## 2020-04-15 ENCOUNTER — Ambulatory Visit
Admission: RE | Admit: 2020-04-15 | Discharge: 2020-04-15 | Disposition: A | Discharge status: Home / Self Care | Payer: BC Managed Care – PPO | Source: Ambulatory Visit | Attending: Surgery | Admitting: Surgery

## 2020-04-15 ENCOUNTER — Other Ambulatory Visit: Payer: Self-pay

## 2020-04-15 DIAGNOSIS — C50211 Malignant neoplasm of upper-inner quadrant of right female breast: Secondary | ICD-10-CM | POA: Insufficient documentation

## 2020-04-15 MED ORDER — GADOBUTROL 1 MMOL/ML IV SOLN
9.0000 mL | Freq: Once | INTRAVENOUS | Status: AC | PRN
  Administered 2020-04-15: 9 mL via INTRAVENOUS

## 2020-04-18 ENCOUNTER — Other Ambulatory Visit: Payer: Self-pay | Admitting: Surgery

## 2020-04-18 DIAGNOSIS — N6459 Other signs and symptoms in breast: Secondary | ICD-10-CM

## 2020-04-18 DIAGNOSIS — N6310 Unspecified lump in the right breast, unspecified quadrant: Secondary | ICD-10-CM

## 2020-04-19 ENCOUNTER — Telehealth: Payer: Self-pay | Admitting: *Deleted

## 2020-04-19 NOTE — Telephone Encounter (Signed)
Patient called and would like to re evaluate the surgical treatment plan that you went over on her visit from 04/12/20. She also mention she wants to talk to you in regards to having a plastic surgeon involved. Please call and advise

## 2020-04-20 ENCOUNTER — Other Ambulatory Visit: Payer: Self-pay | Admitting: Adult Health

## 2020-04-20 ENCOUNTER — Inpatient Hospital Stay (HOSPITAL_BASED_OUTPATIENT_CLINIC_OR_DEPARTMENT_OTHER): Payer: BC Managed Care – PPO | Admitting: Internal Medicine

## 2020-04-20 ENCOUNTER — Encounter: Payer: Self-pay | Admitting: Internal Medicine

## 2020-04-20 ENCOUNTER — Other Ambulatory Visit: Payer: Self-pay

## 2020-04-20 ENCOUNTER — Inpatient Hospital Stay: Payer: BC Managed Care – PPO | Attending: Internal Medicine

## 2020-04-20 ENCOUNTER — Inpatient Hospital Stay: Payer: BC Managed Care – PPO

## 2020-04-20 ENCOUNTER — Ambulatory Visit
Admission: RE | Admit: 2020-04-20 | Payer: BC Managed Care – PPO | Source: Ambulatory Visit | Admitting: Radiation Oncology

## 2020-04-20 DIAGNOSIS — C50811 Malignant neoplasm of overlapping sites of right female breast: Secondary | ICD-10-CM | POA: Diagnosis not present

## 2020-04-20 DIAGNOSIS — Z170 Estrogen receptor positive status [ER+]: Secondary | ICD-10-CM | POA: Insufficient documentation

## 2020-04-20 DIAGNOSIS — Z79899 Other long term (current) drug therapy: Secondary | ICD-10-CM | POA: Diagnosis not present

## 2020-04-20 DIAGNOSIS — N951 Menopausal and female climacteric states: Secondary | ICD-10-CM | POA: Insufficient documentation

## 2020-04-20 DIAGNOSIS — C50211 Malignant neoplasm of upper-inner quadrant of right female breast: Secondary | ICD-10-CM | POA: Insufficient documentation

## 2020-04-20 HISTORY — DX: Malignant neoplasm of overlapping sites of right female breast: Z17.0

## 2020-04-20 MED ORDER — DILTIAZEM HCL ER COATED BEADS 240 MG PO CP24: ORAL_CAPSULE | ORAL | 0 refills | Status: DC

## 2020-04-20 NOTE — Assessment & Plan Note (Addendum)
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  MRI breast-history of the lesions suspicious for malignancy noted without any lymphadenopathy.  # Patient was initially reluctant with mastectomy and wanted to proceed with lumpectomy.  However given the patient's multifocal disease-patient is finally convinced to proceed with mastectomy; sentinel lymph node biopsy.  Patient is planned to undergo immediate reconstruction.  Patient is awaiting evaluation with plastic surgery.  Tomorrow.  # I had a long discussion with the patient in general regarding the treatment options of breast cancer including-surgery; adjuvant radiation; role of adjuvant systemic therapy including-chemotherapy antihormone therapy.  Discussed that would recommend ordering Oncotype-to decide on chemotherapy.  Patient in general reluctant with chemo.   #Hot flashes-postmenopausal; currently on Wellbutrin.  Discussed with PCP-recommend taper Wellbutrin 150 over the next 2 weeks.  Start patient on Celexa.   #Preventative: Recommend holding booster Covid vaccine given- "bad reaction" [flulike symptoms for 2-3 days] to second dose of Covid vaccine.  Thank you for allowing me to participate in the care of your pleasant patient. Please do not hesitate to contact me with questions or concerns in the interim.  # DISPOSITION: # follow up  TBD- Dr.B

## 2020-04-20 NOTE — Progress Notes (Signed)
Met patient briefly after her visit with Dr. Rogue Bussing, and phoned to see if she has any questions post visit.  Gave her Breast Cancer Treatment Handbook.  States she is halfway through.   She is meeting with plastic surgeon tomorrow.  Encouraged to discuss post surgery supply needs with physician,and gave local mastectomy supply information.

## 2020-04-20 NOTE — Progress Notes (Signed)
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 RIGHT BREAST, UIQ 1:00; ULTRASOUND-GUIDED BIOPSY:  - INVASIVE MAMMARY CARCINOMA, NO SPECIAL TYPE.   Size of invasive carcinoma: 6 mm in this sample  Histologic grade of invasive carcinoma: Grade 2                       Glandular/tubular differentiation score: 3                       Nuclear pleomorphism score: 2                       Mitotic rate score: 1                       Total score: 6  Ductal carcinoma in situ: Present, cribriform  Lymphovascular invasion: Not identified   Comment:  The definitive grade will be assigned on the excisional specimen.  ER/PR/HER2 immunohistochemistry will be performed on block A1, with  reflex to [place] for HER2 2+. The results will be reported in an addendum.   B. RIGHT BREAST, OUTER 9:00; ULTRASOUND-GUIDED BIOPSY:  - INVASIVE MAMMARY CARCINOMA, AS DESCRIBED ABOVE (SEE PART A).   IMPRESSION: 1. Biopsy clip artifact with adjacent enhancement within the OUTER RIGHT breast and biopsy clip artifact with adjacent enhancement within the UPPER INNER RIGHT breast, compatible with post biopsy changes/biopsy-proven malignancy. 2. Indeterminate 0.8 cm mass located 2 cm from the OUTER RIGHT breast malignancy/biopsy clip artifact and indeterminate 0.6 cm mass located 1.5 cm from the OUTER RIGHT breast malignancy/biopsy clip artifact. 2nd-look ultrasound is recommended. If these masses are not visualized sonographically, than MR guided biopsy is recommended if breast conservation is desired. 3. No abnormal appearing lymph nodes. 4. No MR evidence of LEFT breast malignancy.  # # SURVIVORSHIP:   # GENETICS:   DIAGNOSIS:   STAGE:         ;   GOALS:  CURRENT/MOST RECENT THERAPY :     Malignant neoplasm of upper-inner quadrant of right female breast ([place])  04/12/2020 Initial Diagnosis   Malignant neoplasm of upper-inner quadrant of right female breast ([place])      HISTORY OF PRESENTING ILLNESS:  Kirsten James 54 y.o.  female pleasant postmenopausal with no significant medical history or family history of breast ovarian malignancy has been referred to [place] for further evaluation recommendations for newly diagnosed breast cancer.  Patient underwent a screening mammogram that showed right breast abnormality which led to further diagnostic mammogram/biopsy.  Patient had biopsy of the right breast with 2 different areas [9:00 and 1:00 see above]-both positive for malignancy.  Patient also had breast MRI that showed 2 other suspicious lesions.   Patient denies any history of hormone replacement therapy.  Patient is on Wellbutrin history of depression/smoking.  Review of Systems  Constitutional: Negative for chills, diaphoresis, fever, malaise/fatigue and weight loss.  HENT: Negative for nosebleeds and sore throat.   Eyes: Negative for double vision.  Respiratory: Negative for cough, hemoptysis, sputum production, shortness of breath and wheezing.   Cardiovascular: Negative for chest pain, palpitations, orthopnea and leg swelling.  Gastrointestinal: Negative for abdominal pain, blood in stool, constipation, diarrhea, heartburn, melena, nausea  and vomiting.  Genitourinary: Negative for dysuria, frequency and urgency.  Musculoskeletal: Negative for back pain and joint pain.  Skin: Negative.  Negative for itching and rash.  Neurological: Negative for dizziness, tingling, focal weakness, weakness and headaches.  Endo/Heme/Allergies: Does not bruise/bleed easily.  Psychiatric/Behavioral: Negative for depression. The patient is not nervous/anxious and does not have insomnia.      MEDICAL HISTORY:  Past Medical History:  Diagnosis  Date  . Allergy   . Frequent urinary tract infections   . Hypertension     SURGICAL HISTORY: Past Surgical History:  Procedure Laterality Date  . BREAST BIOPSY Right 04/05/2020   [place] bx, Q shape marker 9:00, path pending  . BREAST BIOPSY Right 04/05/2020   [place] bx, x shape marker 1:00, path pending  . CHOLECYSTECTOMY    . L4-5 herniated disc repair    . NASAL SINUS SURGERY  2005  . PLANTAR FASCIA RELEASE      SOCIAL HISTORY: Social History   Socioeconomic History  . Marital status: Married    Spouse name: Not on file  . Number of children: Not on file  . Years of education: Not on file  . Highest education level: Not on file  Occupational History  . Not on file  Tobacco Use  . Smoking status: Former Smoker    Quit date: 10/12/2019    Years since quitting: 0.5  . Smokeless tobacco: Never Used  Substance and Sexual Activity  . Alcohol use: Never  . Drug use: Never  . Sexual activity: Not Currently  Other Topics Concern  . Not on file  Social History Narrative   Quit smoking [teen- 1995; quit]; no alcohol. Lives in [place] by self; daughter in [place]. RN- Scientist, research (medical).    Social Determinants of Health   Financial Resource Strain:   . Difficulty of Paying Living Expenses: Not on file  Food Insecurity:   . Worried About Charity fundraiser in the Last Year: Not on file  . Ran Out of Food in the Last Year: Not on file  Transportation Needs:   . Lack of Transportation (Medical): Not on file  . Lack of Transportation (Non-Medical): Not on file  Physical Activity:   . Days of Exercise per Week: Not on file  . Minutes of Exercise per Session: Not on file  Stress:   . Feeling of Stress : Not on file  Social Connections:   . Frequency of Communication with Friends and Family: Not on file  . Frequency of Social Gatherings with Friends and Family: Not on file  . Attends Religious Services: Not on file  . Active Member of Clubs or Organizations: Not  on file  . Attends Archivist Meetings: Not on file  . Marital Status: Not on file  Intimate Partner Violence:   . Fear of Current or Ex-Partner: Not on file  . Emotionally Abused: Not on file  . Physically Abused: Not on file  . Sexually Abused: Not on file    FAMILY HISTORY: Family History  Problem Relation Age of Onset  . Cancer Mother        non-small cell lung cancer- smoker  . Cancer Father        oral & larynx- smoker  . Alzheimer's disease Maternal Grandmother   . Melanoma Maternal Uncle     ALLERGIES:  is allergic to amoxicillin, other, and penicillins.  MEDICATIONS:  Current Outpatient Medications  Medication Sig Dispense Refill  . acetaminophen (TYLENOL) 325  MG tablet Take 650 mg by mouth every 6 (six) hours as needed.    Marland Kitchen azelastine (ASTELIN) 0.1 % nasal spray Place into both nostrils 2 (two) times daily. Use in each nostril as directed     . buPROPion (WELLBUTRIN XL) 300 MG 24 hr tablet Take 1 tablet (300 mg total) by mouth daily. 90 tablet 0  . Cholecalciferol (VITAMIN D3 PO) Take by mouth. 5000IU    . dicyclomine (BENTYL) 10 MG capsule Take 1 capsule (10 mg total) by mouth daily as needed for spasms. 30 capsule 0  . fexofenadine-pseudoephedrine (ALLEGRA-D 24) 180-240 MG 24 hr tablet Take 1 tablet by mouth daily.    . fluticasone (FLONASE) 50 MCG/ACT nasal spray Place into both nostrils daily.    . Multiple Vitamin (MULTIVITAMIN) tablet Take 1 tablet by mouth daily.    . nitrofurantoin (MACRODANTIN) 50 MG capsule Take 50 mg by mouth 4 (four) times daily. Patient reports PRN for UTI    . Omega 3 1000 MG CAPS Take by mouth.    . Turmeric Curcumin 500 MG CAPS Take by mouth.    . diltiazem (CARTIA XT) 240 MG 24 hr capsule Cartia XT 180 mg capsule,extended release Take 1 capsule every day by oral route. 90 capsule 0   No current facility-administered medications for this visit.      Marland Kitchen  PHYSICAL EXAMINATION: ECOG PERFORMANCE STATUS: 0 -  Asymptomatic  Vitals:   04/20/20 1100  BP: 125/76  Pulse: 80  Resp: 16  Temp: 97.8 F (36.6 C)  SpO2: 100%   Filed Weights   04/20/20 1100  Weight: 215 lb 12.8 oz (97.9 kg)    Physical Exam HENT:     Head: Normocephalic and atraumatic.     Mouth/Throat:     Pharynx: No oropharyngeal exudate.  Eyes:     Pupils: Pupils are equal, round, and reactive to light.  Cardiovascular:     Rate and Rhythm: Normal rate and regular rhythm.  Pulmonary:     Effort: Pulmonary effort is normal. No respiratory distress.     Breath sounds: Normal breath sounds. No wheezing.  Abdominal:     General: Bowel sounds are normal. There is no distension.     Palpations: Abdomen is soft. There is no mass.     Tenderness: There is no abdominal tenderness. There is no guarding or rebound.  Musculoskeletal:        General: No tenderness. Normal range of motion.     Cervical back: Normal range of motion and neck supple.  Skin:    General: Skin is warm.  Neurological:     Mental Status: She is alert and oriented to person, place, and time.  Psychiatric:        Mood and Affect: Affect normal.      LABORATORY DATA:  I have reviewed the data as listed Lab Results  Component Value Date   WBC 7.7 02/18/2020   HGB 14.6 02/18/2020   HCT 44.9 02/18/2020   MCV 87 02/18/2020   PLT 300 02/18/2020   Recent Labs    09/21/19 0000 02/18/20 0843  NA 139 138  K 4.3 4.6  CL 101 98  CO2 23 23  GLUCOSE 95 90  BUN 10 13  CREATININE 0.67 0.80  CALCIUM 9.8 9.4  GFRNONAA 101 84  GFRAA 116 97  PROT 7.3 7.2  ALBUMIN 4.7 4.7  AST 21 20  ALT 24 24  ALKPHOS 99 115  BILITOT 0.4 <0.2  RADIOGRAPHIC STUDIES: I have personally reviewed the radiological images as listed and agreed with the findings in the report. MR Breast Bilateral W Wo Contrast  Result Date: 04/15/2020 CLINICAL DATA:  54 year old female for staging of recently diagnosed cancer in the OUTER RIGHT breast and UPPER INNER RIGHT breast.  LABS:  None performed today EXAM: BILATERAL BREAST MRI WITH AND WITHOUT CONTRAST TECHNIQUE: Multiplanar, multisequence MR images of both breasts were obtained prior to and following the intravenous administration of 9 ml of Gadavist Three-dimensional MR images were rendered by post-processing of the original MR data on an independent workstation. The three-dimensional MR images were interpreted, and findings are reported in the following complete MRI report for this study. Three dimensional images were evaluated at the independent interpreting workstation using the DynaCAD thin client. COMPARISON:  Prior mammograms and ultrasounds FINDINGS: Breast composition: c. Heterogeneous fibroglandular tissue. Background parenchymal enhancement: Mild Right breast: Biopsy clip artifact within the anterior OUTER RIGHT breast is noted with minimal adjacent enhancement consistent with biopsy-proven malignancy/post biopsy changes (series 10: Image 51). A 0.8 cm mass located 2 cm anterior/MEDIAL/INFERIOR to the above biopsy clip artifact is noted (10:44). A 0.6 cm mass located 1.5 cm posterior/LATERAL/SUPERIOR to the above biopsy clip artifact is noted (10:59). Biopsy clip artifact within the UPPER INNER RIGHT breast, middle depth, is noted with minimal adjacent enhancement consistent with biopsy-proven malignancy/post biopsy changes (10:75). Left breast: No mass or abnormal enhancement. Lymph nodes: No abnormal appearing lymph nodes. Ancillary findings:  None. IMPRESSION: 1. Biopsy clip artifact with adjacent enhancement within the OUTER RIGHT breast and biopsy clip artifact with adjacent enhancement within the UPPER INNER RIGHT breast, compatible with post biopsy changes/biopsy-proven malignancy. 2. Indeterminate 0.8 cm mass located 2 cm from the OUTER RIGHT breast malignancy/biopsy clip artifact and indeterminate 0.6 cm mass located 1.5 cm from the OUTER RIGHT breast malignancy/biopsy clip artifact. 2nd-look ultrasound is  recommended. If these masses are not visualized sonographically, than MR guided biopsy is recommended if breast conservation is desired. 3. No abnormal appearing lymph nodes. 4. No MR evidence of LEFT breast malignancy. RECOMMENDATION: 2nd-look ultrasound for 2 separate masses within the OUTER RIGHT breast as discussed above. If breast conservation is desired and these masses are not visualized sonographically, than MR guided biopsies would be recommended. BI-RADS CATEGORY  4: Suspicious. Electronically Signed   By: Margarette Canada M.D.   On: 04/15/2020 16:11   US BREAST LTD UNI RIGHT INC AXILLA  Result Date: 03/31/2020 CLINICAL DATA:  Screening recall for possible right breast distortion. EXAM: DIGITAL DIAGNOSTIC UNILATERAL RIGHT MAMMOGRAM WITH TOMO AND CAD; ULTRASOUND RIGHT BREAST LIMITED COMPARISON:  Previous exams. ACR Breast Density Category c: The breast tissue is heterogeneously dense, which may obscure small masses. FINDINGS: Spot compression tomograms were performed of the right breast. There is a persistent area of distortion within adjacent calcifications within the slightly upper outer right breast. There is a persistent area of distortion in the upper slightly inner right breast. Mammographic images were processed with CAD. Targeted ultrasound of the right breast was performed. There is an irregular hypoechoic mass at 1 o'clock 7 cm from nipple measuring 0.5 x 0.6 x 0.6 cm. This corresponds well with the distortion seen in the upper inner right breast at mammography. There is an irregular hypoechoic mass in the right breast at 9 o'clock 3 cm from nipple with an associated calcification measuring 0.7 x 0.7 x 0.8 cm. This corresponds well with the distortion seen in the upper-outer right breast at mammography. No  lymphadenopathy seen in the right axilla. IMPRESSION: 1. Suspicious 0.6 cm mass in the right breast at the 1 o'clock position. 2. Suspicious 0.8 cm mass in the right breast at the 9 o'clock  position. RECOMMENDATION: Recommend ultrasound-guided biopsy of the 2 masses in the right breast. This will be scheduled for the patient. I have discussed the findings and recommendations with the patient. If applicable, a reminder letter will be sent to the patient regarding the next appointment. BI-RADS CATEGORY  4: Suspicious. Electronically Signed   By: Everlean Alstrom M.D.   On: 03/31/2020 11:19   MM DIAG BREAST TOMO UNI RIGHT  Result Date: 03/31/2020 CLINICAL DATA:  Screening recall for possible right breast distortion. EXAM: DIGITAL DIAGNOSTIC UNILATERAL RIGHT MAMMOGRAM WITH TOMO AND CAD; ULTRASOUND RIGHT BREAST LIMITED COMPARISON:  Previous exams. ACR Breast Density Category c: The breast tissue is heterogeneously dense, which may obscure small masses. FINDINGS: Spot compression tomograms were performed of the right breast. There is a persistent area of distortion within adjacent calcifications within the slightly upper outer right breast. There is a persistent area of distortion in the upper slightly inner right breast. Mammographic images were processed with CAD. Targeted ultrasound of the right breast was performed. There is an irregular hypoechoic mass at 1 o'clock 7 cm from nipple measuring 0.5 x 0.6 x 0.6 cm. This corresponds well with the distortion seen in the upper inner right breast at mammography. There is an irregular hypoechoic mass in the right breast at 9 o'clock 3 cm from nipple with an associated calcification measuring 0.7 x 0.7 x 0.8 cm. This corresponds well with the distortion seen in the upper-outer right breast at mammography. No lymphadenopathy seen in the right axilla. IMPRESSION: 1. Suspicious 0.6 cm mass in the right breast at the 1 o'clock position. 2. Suspicious 0.8 cm mass in the right breast at the 9 o'clock position. RECOMMENDATION: Recommend ultrasound-guided biopsy of the 2 masses in the right breast. This will be scheduled for the patient. I have discussed the findings  and recommendations with the patient. If applicable, a reminder letter will be sent to the patient regarding the next appointment. BI-RADS CATEGORY  4: Suspicious. Electronically Signed   By: Everlean Alstrom M.D.   On: 03/31/2020 11:19   MM CLIP PLACEMENT RIGHT  Result Date: 04/05/2020 CLINICAL DATA:  Evaluate placement of x shaped biopsy clip and Q shaped biopsy clip following ultrasound-guided RIGHT breast biopsies. EXAM: DIAGNOSTIC RIGHT MAMMOGRAM POST ULTRASOUND BIOPSY COMPARISON:  Previous exam(s). FINDINGS: Mammographic images were obtained following ultrasound guided biopsies of the 0.6 cm mass at the 1 o'clock position and 0.8 cm mass at the 9 o'clock position of the RIGHT breast. The X biopsy marking clip is in expected position at the site of biopsy within the UPPER INNER RIGHT breast. The Q biopsy marking clip is in expected position at the site of biopsy within the OUTER RIGHT breast. The 2 clips are separated by a distance of 9 cm in the transverse dimension. IMPRESSION: Appropriate positioning of the X shaped biopsy marking clip at the site of biopsy in the UPPER INNER RIGHT breast. Appropriate positioning of the Q shaped biopsy marking clip at the site of biopsy in the OUTER RIGHT breast. Final Assessment: Post Procedure Mammograms for Marker Placement Electronically Signed   By: Margarette Canada M.D.   On: 04/05/2020 11:22   [place] RT BREAST BX W LOC DEV 1ST LESION IMG BX SPEC US GUIDE  Addendum Date: 04/07/2020   ADDENDUM REPORT:  04/07/2020 15:40 ADDENDUM: PATHOLOGY revealed: Site A. RIGHT BREAST, UIQ 1:00; ULTRASOUND-GUIDED BIOPSY: - INVASIVE MAMMARY CARCINOMA, NO SPECIAL TYPE. 6 mm in this sample. Grade 2. Ductal carcinoma in situ: Present, cribriform. Lymphovascular invasion: Not identified. Pathology results are CONCORDANT with imaging findings, per Dr. Hassan Rowan. PATHOLOGY revealed: Site B. RIGHT BREAST, OUTER 9:00; ULTRASOUND-GUIDED BIOPSY: - INVASIVE MAMMARY CARCINOMA, AS DESCRIBED ABOVE (SEE  PART A). Pathology results are CONCORDANT with imaging findings, per Dr. Hassan Rowan. Pathology results and recommendations below were discussed with patient by telephone on 04/06/2020. Patient reported biopsy site within normal limits with slight tenderness at the site. Post biopsy care instructions were reviewed, questions were answered and my direct phone number was provided to patient. Patient was instructed to call [facility] if any concerns or questions arise related to the biopsy. Recommendation: Request for surgical consultation relayed to Al Pimple RN and Tanya Nones RN at [facility] by Electa Sniff RN on 04/06/2020. Pathology results reported by Electa Sniff RN on 04/07/2020. Electronically Signed   By: Margarette Canada M.D.   On: 04/07/2020 15:40   Result Date: 04/07/2020 CLINICAL DATA:  54 year old female for tissue sampling of 0.6 cm UPPER INNER RIGHT breast mass and 0.8 cm OUTER RIGHT breast mass. EXAM: ULTRASOUND GUIDED RIGHT BREAST CORE NEEDLE BIOPSY X 2 COMPARISON:  Previous exam(s). FINDINGS: I met with the patient and we discussed the procedures of ultrasound-guided biopsies, including benefits and alternatives. We discussed the high likelihood of successful procedures. We discussed the risks of the procedures, including infection, bleeding, tissue injury, clip migration, and inadequate sampling. Informed written consent was given. The usual time-out protocol was performed immediately prior to the procedures. ULTRASOUND-GUIDED RIGHT BREAST CORE NEEDLE BIOPSY #1 (0.6 cm mass at the 1 o'clock position - X clip): Using sterile technique and 1% Lidocaine as local anesthetic, under direct ultrasound visualization, a 12 gauge spring-loaded device was used to perform biopsy of the 0.6 cm mass at the 1 o'clock position using a LATERAL approach. At the conclusion of the procedure an X tissue marker clip was deployed into the biopsy cavity. Follow up 2 view mammogram was performed  and dictated separately. ULTRASOUND-GUIDED RIGHT BREAST CORE NEEDLE BIOPSY 2 (0.8 cm mass at the 9 o'clock position - Q clip): Using sterile technique and 1% Lidocaine as local anesthetic, under direct ultrasound visualization, a 12 gauge spring-loaded device was used to perform biopsy of the 0.8 cm mass at the 9 o'clock position using a LATERAL approach. At the conclusion of the procedure a Q tissue marker clip was deployed into the biopsy cavity. Follow up 2 view mammogram was performed and dictated separately. IMPRESSION: Ultrasound-guided biopsy of a 0.6 cm UPPER INNER RIGHT breast mass and ultrasound-guided biopsy of a 0.8 cm OUTER RIGHT breast mass. No apparent complication. Electronically Signed: By: Margarette Canada M.D. On: 04/05/2020 11:06   [place] RT BREAST BX W LOC DEV EA ADD LESION IMG BX SPEC US GUIDE  Addendum Date: 04/07/2020   ADDENDUM REPORT: 04/07/2020 15:40 ADDENDUM: PATHOLOGY revealed: Site A. RIGHT BREAST, UIQ 1:00; ULTRASOUND-GUIDED BIOPSY: - INVASIVE MAMMARY CARCINOMA, NO SPECIAL TYPE. 6 mm in this sample. Grade 2. Ductal carcinoma in situ: Present, cribriform. Lymphovascular invasion: Not identified. Pathology results are CONCORDANT with imaging findings, per Dr. Hassan Rowan. PATHOLOGY revealed: Site B. RIGHT BREAST, OUTER 9:00; ULTRASOUND-GUIDED BIOPSY: - INVASIVE MAMMARY CARCINOMA, AS DESCRIBED ABOVE (SEE PART A). Pathology results are CONCORDANT with imaging findings, per Dr. Hassan Rowan. Pathology results and recommendations  below were discussed with patient by telephone on 04/06/2020. Patient reported biopsy site within normal limits with slight tenderness at the site. Post biopsy care instructions were reviewed, questions were answered and my direct phone number was provided to patient. Patient was instructed to call [facility] if any concerns or questions arise related to the biopsy. Recommendation: Request for surgical consultation relayed to Al Pimple RN and Tanya Nones RN at  [facility] by Electa Sniff RN on 04/06/2020. Pathology results reported by Electa Sniff RN on 04/07/2020. Electronically Signed   By: Margarette Canada M.D.   On: 04/07/2020 15:40   Result Date: 04/07/2020 CLINICAL DATA:  54 year old female for tissue sampling of 0.6 cm UPPER INNER RIGHT breast mass and 0.8 cm OUTER RIGHT breast mass. EXAM: ULTRASOUND GUIDED RIGHT BREAST CORE NEEDLE BIOPSY X 2 COMPARISON:  Previous exam(s). FINDINGS: I met with the patient and we discussed the procedures of ultrasound-guided biopsies, including benefits and alternatives. We discussed the high likelihood of successful procedures. We discussed the risks of the procedures, including infection, bleeding, tissue injury, clip migration, and inadequate sampling. Informed written consent was given. The usual time-out protocol was performed immediately prior to the procedures. ULTRASOUND-GUIDED RIGHT BREAST CORE NEEDLE BIOPSY #1 (0.6 cm mass at the 1 o'clock position - X clip): Using sterile technique and 1% Lidocaine as local anesthetic, under direct ultrasound visualization, a 12 gauge spring-loaded device was used to perform biopsy of the 0.6 cm mass at the 1 o'clock position using a LATERAL approach. At the conclusion of the procedure an X tissue marker clip was deployed into the biopsy cavity. Follow up 2 view mammogram was performed and dictated separately. ULTRASOUND-GUIDED RIGHT BREAST CORE NEEDLE BIOPSY 2 (0.8 cm mass at the 9 o'clock position - Q clip): Using sterile technique and 1% Lidocaine as local anesthetic, under direct ultrasound visualization, a 12 gauge spring-loaded device was used to perform biopsy of the 0.8 cm mass at the 9 o'clock position using a LATERAL approach. At the conclusion of the procedure a Q tissue marker clip was deployed into the biopsy cavity. Follow up 2 view mammogram was performed and dictated separately. IMPRESSION: Ultrasound-guided biopsy of a 0.6 cm UPPER INNER RIGHT breast mass and  ultrasound-guided biopsy of a 0.8 cm OUTER RIGHT breast mass. No apparent complication. Electronically Signed: By: Margarette Canada M.D. On: 04/05/2020 11:06    ASSESSMENT & PLAN:   Carcinoma of overlapping sites of right breast in female, estrogen receptor positive ([place]) #Right breast-invasive mammary carcinoma-x2 foci [9:00 and 2 o'clock position]-9 o'clock position-invasive mammary carcinoma ER/PR positive HER-2 negative.  MRI breast-history of the lesions suspicious for malignancy noted without any lymphadenopathy.  # Patient was initially reluctant with mastectomy and wanted to proceed with lumpectomy.  However given the patient's multifocal disease-patient is finally convinced to proceed with mastectomy; sentinel lymph node biopsy.  Patient is planned to undergo immediate reconstruction.  Patient is awaiting evaluation with plastic surgery.  Tomorrow.  # I had a long discussion with the patient in general regarding the treatment options of breast cancer including-surgery; adjuvant radiation; role of adjuvant systemic therapy including-chemotherapy antihormone therapy.  Discussed that would recommend ordering Oncotype-to decide on chemotherapy.  Patient in general reluctant with chemo.   #Hot flashes-postmenopausal; currently on Wellbutrin.  Discussed with PCP-recommend taper Wellbutrin 150 over the next 2 weeks.  Start patient on Celexa.   #Preventative: Recommend holding booster Covid vaccine given- "bad reaction" [flulike symptoms for 2-3 days] to second  dose of Covid vaccine.  Thank you for allowing me to participate in the care of your pleasant patient. Please do not hesitate to contact me with questions or concerns in the interim.  # DISPOSITION: # follow up  TBD- Dr.B     All questions were answered. The patient knows to call the clinic with any problems, questions or concerns.       Cammie Sickle, MD 04/20/2020 5:09 PM

## 2020-04-21 ENCOUNTER — Other Ambulatory Visit: Payer: Self-pay

## 2020-04-21 ENCOUNTER — Telehealth: Payer: Self-pay | Admitting: Adult Health

## 2020-04-21 ENCOUNTER — Encounter: Payer: Self-pay | Admitting: Plastic Surgery

## 2020-04-21 ENCOUNTER — Ambulatory Visit: Payer: BC Managed Care – PPO | Admitting: Plastic Surgery

## 2020-04-21 ENCOUNTER — Encounter: Payer: Self-pay | Admitting: Internal Medicine

## 2020-04-21 VITALS — BP 155/81 | HR 95 | Temp 98.5°F | Ht 69.0 in | Wt 215.0 lb

## 2020-04-21 DIAGNOSIS — Z170 Estrogen receptor positive status [ER+]: Secondary | ICD-10-CM

## 2020-04-21 DIAGNOSIS — C50811 Malignant neoplasm of overlapping sites of right female breast: Secondary | ICD-10-CM

## 2020-04-21 NOTE — Telephone Encounter (Signed)
Please review. KW 

## 2020-04-21 NOTE — Progress Notes (Signed)
Referring Provider Flinch[place], Kelby Aline, [place] [address] [place],  [place] [address]   CC:  Chief Complaint  Patient presents with  . Consult      Kirsten James is an 54 y.o. female.  HPI: Patient presents to discuss breast reconstruction.  She was diagnosed with a new right-sided breast cancer recently.  It was initially believed that she had to separate lesions but follow-up MRI suggestive of more multifocal disease.  She is interested in a right sided mastectomy with sentinel lymph node biopsy and immediate reconstruction.  She is done quite a bit of research and seems very knowledgeable.  Allergies  Allergen Reactions  . Amoxicillin Hives  . Other     Allergy to pollen and mold- patient reports watery eyes, runny nose and sneezing as reaction  . Penicillins Hives    Outpatient Encounter Medications as of 04/21/2020  Medication Sig  . buPROPion (WELLBUTRIN XL) 300 MG 24 hr tablet Take 1 tablet (300 mg total) by mouth daily.  . Cholecalciferol (VITAMIN D3 PO) Take by mouth. 5000IU  . dicyclomine (BENTYL) 10 MG capsule Take 1 capsule (10 mg total) by mouth daily as needed for spasms.  Marland Kitchen diltiazem (CARTIA XT) 240 MG 24 hr capsule Cartia XT 180 mg capsule,extended release Take 1 capsule every day by oral route.  . fexofenadine-pseudoephedrine (ALLEGRA-D 24) 180-240 MG 24 hr tablet Take 1 tablet by mouth daily.  . fluticasone (FLONASE) 50 MCG/ACT nasal spray Place into both nostrils daily.  . Multiple Vitamin (MULTIVITAMIN) tablet Take 1 tablet by mouth daily.  . nitrofurantoin (MACRODANTIN) 50 MG capsule Take 50 mg by mouth 4 (four) times daily. Patient reports PRN for UTI  . Omega 3 1000 MG CAPS Take by mouth.  . Turmeric Curcumin 500 MG CAPS Take by mouth.  Marland Kitchen acetaminophen (TYLENOL) 325 MG tablet Take 650 mg by mouth every 6 (six) hours as needed. (Patient not taking: Reported on 04/21/2020)  . azelastine (ASTELIN) 0.1 % nasal spray Place into both  nostrils 2 (two) times daily. Use in each nostril as directed  (Patient not taking: Reported on 04/21/2020)   No facility-administered encounter medications on file as of 04/21/2020.     Past Medical History:  Diagnosis Date  . Allergy   . Frequent urinary tract infections   . Hypertension     Past Surgical History:  Procedure Laterality Date  . BREAST BIOPSY Right 04/05/2020   [place] bx, Q shape marker 9:00, path pending  . BREAST BIOPSY Right 04/05/2020   [place] bx, x shape marker 1:00, path pending  . CHOLECYSTECTOMY    . L4-5 herniated disc repair    . NASAL SINUS SURGERY  2005  . PLANTAR FASCIA RELEASE      Family History  Problem Relation Age of Onset  . Cancer Mother        non-small cell lung cancer- smoker  . Cancer Father        oral & larynx- smoker  . Alzheimer's disease Maternal Grandmother   . Melanoma Maternal Uncle     Social History   Social History Narrative   Quit smoking [teen- 1995; quit]; no alcohol. Lives in [place] by self; daughter in [place]. RN- Scientist, research (medical).   Denies tobacco use currently  Review of Systems General: Denies fevers, chills, weight loss CV: Denies chest pain, shortness of breath, palpitations  Physical Exam Vitals with BMI 04/21/2020 04/20/2020 04/12/2020  Height 5\' 9"  5\' 9"  5\' 9"   Weight 215  lbs 215 lbs 13 oz 215 lbs  BMI 31.74 28.41 32.44  Systolic 010 272 536  Diastolic 81 76 644  Pulse 95 80 88    General:  No acute distress,  Alert and oriented, Non-Toxic, Normal speech and affect Breast: She has grade 2 ptosis.  She has bruising on the right side from recent biopsy.  She is probably around a double D cup.  Do not see any obvious scars.  Base width is 12.5  Assessment/Plan Patient presents for discussion of right breast reconstruction.  She is a good candidate for immediate reconstruction.  We briefly touched on autologous tissue reconstruction she is not interested in this.  We discussed nipple sparing  and skin sparing mastectomy and I explained the challenges of nipple sparing.  I explained that the degree of ptosis that she has would make positioning the nipple over the implant somewhat challenging.  Additionally I am uncertain if she would be a candidate for this from a cancer standpoint.  We discussed implant-based reconstruction by placing either tissue expander or permanent implant at the time of surgery.  I explained that I would examine the skin flaps intraoperatively and decide on the best approach.  I also explained that I would prefer to do prepectoral placement but ultimately would make that decision intraoperatively as well.  I discussed the need to switch out the tissue expander for implant a subsequent surgery if that ends up being will be due.  I discussed the use of acellular dermal matrix in the process and she is familiar and supportive of this.  I would plan to use indocyanine green angiography to examine the skin flaps intraoperatively and place a incisional wound VAC dressing after everything is closed.  We discussed the risk that include bleeding, infection, damage to surrounding structures and need for additional procedures.  All her questions were answered and we will plan to proceed with scheduling this with Dr. Christian Mate.  [name] 04/21/2020, 3:08 PM

## 2020-04-21 NOTE — Telephone Encounter (Signed)
[facility] calling to clarify the rx diltiazem (CARTIA XT) 240 MG 24 hr capsule  There are 2 sets of instructins with this med. Sig: Cartia XT 180 mg capsule,extended release Take 1 capsule every day by oral route.   Sent to pharmacy as: diltiazem (CARTIA XT) 240 MG 24 hr capsule    Which one do you need?  [id number] Ref no. [id number]

## 2020-04-21 NOTE — Telephone Encounter (Signed)
Cartia XT 180mg  was discontinued. Please notify [facility] of change.

## 2020-04-22 ENCOUNTER — Telehealth: Payer: Self-pay | Admitting: Internal Medicine

## 2020-04-22 ENCOUNTER — Ambulatory Visit: Payer: BC Managed Care – PPO

## 2020-04-22 ENCOUNTER — Other Ambulatory Visit: Payer: Self-pay | Admitting: Surgery

## 2020-04-22 ENCOUNTER — Other Ambulatory Visit
Admission: RE | Admit: 2020-04-22 | Discharge: 2020-04-22 | Disposition: A | Discharge status: Home / Self Care | Payer: BC Managed Care – PPO | Source: Ambulatory Visit | Attending: Surgery | Admitting: Surgery

## 2020-04-22 ENCOUNTER — Telehealth: Payer: Self-pay | Admitting: Surgery

## 2020-04-22 DIAGNOSIS — Z01818 Encounter for other preprocedural examination: Secondary | ICD-10-CM | POA: Insufficient documentation

## 2020-04-22 DIAGNOSIS — C50811 Malignant neoplasm of overlapping sites of right female breast: Secondary | ICD-10-CM

## 2020-04-22 DIAGNOSIS — Z20822 Contact with and (suspected) exposure to covid-19: Secondary | ICD-10-CM | POA: Insufficient documentation

## 2020-04-22 DIAGNOSIS — Z170 Estrogen receptor positive status [ER+]: Secondary | ICD-10-CM

## 2020-04-22 MED ORDER — CITALOPRAM HYDROBROMIDE 20 MG PO TABS: 20.0000 mg | ORAL_TABLET | Freq: Every day | ORAL | 1 refills | Status: DC

## 2020-04-22 NOTE — Telephone Encounter (Signed)
On 10/06-spoke to patient regarding switching over to Celexa.  Recommend taking Wellbutrin every other day for the next 1 week.  And then stop.  Plan start Celexa 20 mg a day; start in approximately 1 week.  H/T-Please inform patient that prescription sent today.

## 2020-04-22 NOTE — Telephone Encounter (Signed)
Patient has been advised of Pre-Admission date/time, COVID Testing date and Surgery date.  Surgery Date: 04/25/20 Preadmission Testing Date: patient to arrive at 11:00 am for [facility] first on 04/25/20  Covid Testing Date: 04/22/20 - patient advised to go to the [facility] ([place]) between 8a-1p.  Patient is on her way now for Covid testing.

## 2020-04-23 LAB — SARS CORONAVIRUS 2 (TAT 6-24 HRS): SARS Coronavirus 2: NEGATIVE

## 2020-04-24 MED ORDER — ORAL CARE MOUTH RINSE: 15.0000 mL | Freq: Once | OROMUCOSAL | Status: AC

## 2020-04-24 MED ORDER — LACTATED RINGERS IV SOLN: INTRAVENOUS | Status: DC

## 2020-04-24 MED ORDER — CHLORHEXIDINE GLUCONATE 0.12 % MT SOLN: 15.0000 mL | Freq: Once | OROMUCOSAL | Status: AC

## 2020-04-25 ENCOUNTER — Encounter: Payer: Self-pay | Admitting: Surgery

## 2020-04-25 ENCOUNTER — Other Ambulatory Visit (INDEPENDENT_AMBULATORY_CARE_PROVIDER_SITE_OTHER): Payer: BC Managed Care – PPO | Admitting: Plastic Surgery

## 2020-04-25 ENCOUNTER — Ambulatory Visit
Admission: RE | Admit: 2020-04-25 | Discharge: 2020-04-25 | Disposition: A | Discharge status: Home / Self Care | Payer: BC Managed Care – PPO | Attending: Surgery | Admitting: Surgery

## 2020-04-25 ENCOUNTER — Ambulatory Visit
Admission: RE | Admit: 2020-04-25 | Discharge: 2020-04-25 | Disposition: A | Discharge status: Home / Self Care | Payer: BC Managed Care – PPO | Source: Ambulatory Visit | Attending: Surgery | Admitting: Surgery

## 2020-04-25 ENCOUNTER — Inpatient Hospital Stay: Payer: BC Managed Care – PPO | Admitting: Certified Registered"

## 2020-04-25 ENCOUNTER — Encounter
Admission: RE | Disposition: A | Discharge status: Home / Self Care | Payer: Self-pay | Source: Home / Self Care | Attending: Surgery

## 2020-04-25 ENCOUNTER — Other Ambulatory Visit: Payer: Self-pay

## 2020-04-25 DIAGNOSIS — Z87891 Personal history of nicotine dependence: Secondary | ICD-10-CM | POA: Insufficient documentation

## 2020-04-25 DIAGNOSIS — C50811 Malignant neoplasm of overlapping sites of right female breast: Secondary | ICD-10-CM

## 2020-04-25 DIAGNOSIS — C50911 Malignant neoplasm of unspecified site of right female breast: Secondary | ICD-10-CM | POA: Insufficient documentation

## 2020-04-25 DIAGNOSIS — Z170 Estrogen receptor positive status [ER+]: Secondary | ICD-10-CM

## 2020-04-25 DIAGNOSIS — I10 Essential (primary) hypertension: Secondary | ICD-10-CM | POA: Diagnosis not present

## 2020-04-25 DIAGNOSIS — Z880 Allergy status to penicillin: Secondary | ICD-10-CM | POA: Insufficient documentation

## 2020-04-25 DIAGNOSIS — C50211 Malignant neoplasm of upper-inner quadrant of right female breast: Secondary | ICD-10-CM

## 2020-04-25 HISTORY — PX: BREAST RECONSTRUCTION WITH PLACEMENT OF TISSUE EXPANDER AND FLEX HD (ACELLULAR HYDRATED DERMIS): SHX6295

## 2020-04-25 HISTORY — PX: SIMPLE MASTECTOMY WITH AXILLARY SENTINEL NODE BIOPSY: SHX6098

## 2020-04-25 SURGERY — SIMPLE MASTECTOMY WITH AXILLARY SENTINEL NODE BIOPSY
Anesthesia: General | Laterality: Right

## 2020-04-25 MED ORDER — OXYCODONE HCL 5 MG PO TABS: 5.0000 mg | ORAL_TABLET | Freq: Once | ORAL | Status: DC | PRN

## 2020-04-25 MED ORDER — SPY AGENT GREEN - (INDOCYANINE FOR INJECTION)
INTRAMUSCULAR | Status: DC | PRN
  Administered 2020-04-25: 3 mL via INTRAVENOUS

## 2020-04-25 MED ORDER — SODIUM CHLORIDE 0.9 % IV SOLN: INTRAVENOUS | Status: DC | PRN

## 2020-04-25 MED ORDER — FENTANYL CITRATE (PF) 100 MCG/2ML IJ SOLN: 25.0000 ug | INTRAMUSCULAR | Status: DC | PRN

## 2020-04-25 MED ORDER — ONDANSETRON HCL 4 MG/2ML IJ SOLN
INTRAMUSCULAR | Status: DC | PRN
  Administered 2020-04-25 (×2): 4 mg via INTRAVENOUS

## 2020-04-25 MED ORDER — LIDOCAINE HCL (CARDIAC) PF 100 MG/5ML IV SOSY
PREFILLED_SYRINGE | INTRAVENOUS | Status: DC | PRN
  Administered 2020-04-25: 60 mg via INTRAVENOUS

## 2020-04-25 MED ORDER — SODIUM CHLORIDE FLUSH 0.9 % IV SOLN
INTRAVENOUS | Status: AC
  Filled 2020-04-25: qty 40

## 2020-04-25 MED ORDER — HYDROCODONE-ACETAMINOPHEN 5-325 MG PO TABS
1.0000 | ORAL_TABLET | Freq: Three times a day (TID) | ORAL | 0 refills | Status: AC | PRN
Start: 2020-04-25 — End: 2020-05-02

## 2020-04-25 MED ORDER — HYDROCODONE-ACETAMINOPHEN 5-325 MG PO TABS
ORAL_TABLET | ORAL | Status: DC
Start: 2020-04-25 — End: 2020-04-26
  Filled 2020-04-25: qty 1

## 2020-04-25 MED ORDER — OXYCODONE HCL 5 MG/5ML PO SOLN: 5.0000 mg | Freq: Once | ORAL | Status: DC | PRN

## 2020-04-25 MED ORDER — KETOROLAC TROMETHAMINE 30 MG/ML IJ SOLN
INTRAMUSCULAR | Status: DC | PRN
  Administered 2020-04-25: 30 mg via INTRAVENOUS

## 2020-04-25 MED ORDER — GENTAMICIN SULFATE 40 MG/ML IJ SOLN
INTRAMUSCULAR | Status: AC
  Filled 2020-04-25: qty 2

## 2020-04-25 MED ORDER — KETOROLAC TROMETHAMINE 30 MG/ML IJ SOLN
INTRAMUSCULAR | Status: AC
  Filled 2020-04-25: qty 1

## 2020-04-25 MED ORDER — MIDAZOLAM HCL 2 MG/2ML IJ SOLN
INTRAMUSCULAR | Status: DC | PRN
  Administered 2020-04-25: 2 mg via INTRAVENOUS

## 2020-04-25 MED ORDER — ONDANSETRON HCL 4 MG PO TABS: 4.0000 mg | ORAL_TABLET | Freq: Three times a day (TID) | ORAL | 0 refills | Status: DC | PRN

## 2020-04-25 MED ORDER — GLYCOPYRROLATE 0.2 MG/ML IJ SOLN
INTRAMUSCULAR | Status: DC | PRN
  Administered 2020-04-25: .2 mg via INTRAVENOUS

## 2020-04-25 MED ORDER — ONDANSETRON HCL 4 MG/2ML IJ SOLN: 4.0000 mg | Freq: Once | INTRAMUSCULAR | Status: DC | PRN

## 2020-04-25 MED ORDER — ROCURONIUM BROMIDE 100 MG/10ML IV SOLN
INTRAVENOUS | Status: DC | PRN
  Administered 2020-04-25: 20 mg via INTRAVENOUS
  Administered 2020-04-25: 50 mg via INTRAVENOUS
  Administered 2020-04-25 (×3): 10 mg via INTRAVENOUS

## 2020-04-25 MED ORDER — PROPOFOL 10 MG/ML IV BOLUS
INTRAVENOUS | Status: DC | PRN
  Administered 2020-04-25: 150 mg via INTRAVENOUS

## 2020-04-25 MED ORDER — FENTANYL CITRATE (PF) 250 MCG/5ML IJ SOLN
INTRAMUSCULAR | Status: AC
  Filled 2020-04-25: qty 5

## 2020-04-25 MED ORDER — BUPIVACAINE-EPINEPHRINE (PF) 0.25% -1:200000 IJ SOLN
INTRAMUSCULAR | Status: AC
  Filled 2020-04-25: qty 30

## 2020-04-25 MED ORDER — CEFAZOLIN SODIUM 1 G IJ SOLR
INTRAMUSCULAR | Status: AC
  Filled 2020-04-25: qty 10

## 2020-04-25 MED ORDER — LIDOCAINE HCL (PF) 2 % IJ SOLN
INTRAMUSCULAR | Status: AC
  Filled 2020-04-25: qty 10

## 2020-04-25 MED ORDER — DEXAMETHASONE SODIUM PHOSPHATE 10 MG/ML IJ SOLN
INTRAMUSCULAR | Status: DC | PRN
  Administered 2020-04-25: 10 mg via INTRAVENOUS

## 2020-04-25 MED ORDER — SULFAMETHOXAZOLE-TRIMETHOPRIM 800-160 MG PO TABS: 1.0000 | ORAL_TABLET | Freq: Two times a day (BID) | ORAL | 0 refills | Status: AC

## 2020-04-25 MED ORDER — ISOSULFAN BLUE 1 % ~~LOC~~ SOLN
SUBCUTANEOUS | Status: DC | PRN
  Administered 2020-04-25: 5 mL via SUBCUTANEOUS

## 2020-04-25 MED ORDER — DEXAMETHASONE SODIUM PHOSPHATE 10 MG/ML IJ SOLN
INTRAMUSCULAR | Status: AC
  Filled 2020-04-25: qty 1

## 2020-04-25 MED ORDER — ISOSULFAN BLUE 1 % ~~LOC~~ SOLN
SUBCUTANEOUS | Status: AC
  Filled 2020-04-25: qty 5

## 2020-04-25 MED ORDER — ACETAMINOPHEN 10 MG/ML IV SOLN
INTRAVENOUS | Status: DC | PRN
  Administered 2020-04-25: 1000 mg via INTRAVENOUS

## 2020-04-25 MED ORDER — CHLORHEXIDINE GLUCONATE 0.12 % MT SOLN
OROMUCOSAL | Status: AC
  Administered 2020-04-25: 15 mL via OROMUCOSAL
  Filled 2020-04-25: qty 15

## 2020-04-25 MED ORDER — MIDAZOLAM HCL 2 MG/2ML IJ SOLN
INTRAMUSCULAR | Status: AC
  Filled 2020-04-25: qty 2

## 2020-04-25 MED ORDER — TECHNETIUM TC 99M SULFUR COLLOID FILTERED
0.6240 | Freq: Once | INTRAVENOUS | Status: AC | PRN
  Administered 2020-04-25: 0.624 via INTRADERMAL

## 2020-04-25 MED ORDER — ACETAMINOPHEN 10 MG/ML IV SOLN
INTRAVENOUS | Status: AC
  Filled 2020-04-25: qty 100

## 2020-04-25 MED ORDER — PROPOFOL 10 MG/ML IV BOLUS
INTRAVENOUS | Status: AC
  Filled 2020-04-25: qty 20

## 2020-04-25 MED ORDER — SODIUM CHLORIDE 0.9 % IV SOLN
INTRAVENOUS | Status: DC | PRN
  Administered 2020-04-25: 2 mL

## 2020-04-25 MED ORDER — CEFAZOLIN SODIUM-DEXTROSE 2-3 GM-%(50ML) IV SOLR
INTRAVENOUS | Status: DC | PRN
  Administered 2020-04-25: 2 g via INTRAVENOUS

## 2020-04-25 MED ORDER — SUGAMMADEX SODIUM 200 MG/2ML IV SOLN
INTRAVENOUS | Status: DC | PRN
  Administered 2020-04-25: 200 mg via INTRAVENOUS

## 2020-04-25 MED ORDER — FENTANYL CITRATE (PF) 100 MCG/2ML IJ SOLN
INTRAMUSCULAR | Status: DC | PRN
Start: 2020-04-25 — End: 2020-04-25
  Administered 2020-04-25: 250 ug via INTRAVENOUS

## 2020-04-25 MED ORDER — DEXMEDETOMIDINE (PRECEDEX) IN NS 20 MCG/5ML (4 MCG/ML) IV SYRINGE
PREFILLED_SYRINGE | INTRAVENOUS | Status: DC | PRN
  Administered 2020-04-25: 12 ug via INTRAVENOUS
  Administered 2020-04-25: 8 ug via INTRAVENOUS

## 2020-04-25 MED ORDER — ONDANSETRON HCL 4 MG/2ML IJ SOLN
INTRAMUSCULAR | Status: AC
  Filled 2020-04-25: qty 2

## 2020-04-25 MED ORDER — BUPIVACAINE LIPOSOME 1.3 % IJ SUSP
INTRAMUSCULAR | Status: AC
  Filled 2020-04-25: qty 20

## 2020-04-25 MED ORDER — HYDROCODONE-ACETAMINOPHEN 5-325 MG PO TABS
1.0000 | ORAL_TABLET | Freq: Once | ORAL | Status: AC
  Administered 2020-04-25: 1 via ORAL

## 2020-04-25 SURGICAL SUPPLY — 87 items
ADH SKN CLS APL DERMABOND .7 (GAUZE/BANDAGES/DRESSINGS) ×2
APL PRP STRL LF DISP 70% ISPRP (MISCELLANEOUS) ×3
BINDER BREAST LRG (GAUZE/BANDAGES/DRESSINGS) IMPLANT
BINDER BREAST MEDIUM (GAUZE/BANDAGES/DRESSINGS) IMPLANT
BINDER BREAST XLRG (GAUZE/BANDAGES/DRESSINGS) IMPLANT
BINDER BREAST XXLRG (GAUZE/BANDAGES/DRESSINGS) IMPLANT
BIOPATCH WHT 1IN DISK W/4.0 H (GAUZE/BANDAGES/DRESSINGS) IMPLANT
BLADE BOVIE TIP EXT 4 (BLADE) ×2 IMPLANT
BLADE SURG 15 STRL LF DISP TIS (BLADE) ×2 IMPLANT
BLADE SURG 15 STRL SS (BLADE) ×4
BNDG CMPR STD VLCR NS LF 5.8X6 (GAUZE/BANDAGES/DRESSINGS) ×2
BNDG ELASTIC 6X5.8 VLCR NS LF (GAUZE/BANDAGES/DRESSINGS) ×4 IMPLANT
BULB RESERV EVAC DRAIN JP 100C (MISCELLANEOUS) ×2 IMPLANT
CANISTER SUCT 1200ML W/VALVE (MISCELLANEOUS) ×4 IMPLANT
CHLORAPREP W/TINT 26 (MISCELLANEOUS) ×6 IMPLANT
CLIP VESOCCLUDE MED 6/CT (CLIP) IMPLANT
COVER WAND RF STERILE (DRAPES) ×2 IMPLANT
DERMABOND ADVANCED (GAUZE/BANDAGES/DRESSINGS) ×2
DERMABOND ADVANCED .7 DNX12 (GAUZE/BANDAGES/DRESSINGS) ×2 IMPLANT
DRAIN CHANNEL JP 15F RND 16 (MISCELLANEOUS) ×2 IMPLANT
DRAPE 3/4 80X56 (DRAPES) ×4 IMPLANT
DRAPE CHEST BREAST 77X106 FENE (MISCELLANEOUS) IMPLANT
DRAPE LAPAROTOMY TRNSV 106X77 (MISCELLANEOUS) ×2 IMPLANT
DRAPE UTILITY 15X26 TOWEL STRL (DRAPES) ×4 IMPLANT
DRESSING PRVNA BELLAFORM 24X22 (GAUZE/BANDAGES/DRESSINGS) ×1 IMPLANT
DRSG GAUZE FLUFF 36X18 (GAUZE/BANDAGES/DRESSINGS) ×2 IMPLANT
DRSG PREVENA BELLAFORM 24X22 (GAUZE/BANDAGES/DRESSINGS) ×2
DRSG TEGADERM 4X4.75 (GAUZE/BANDAGES/DRESSINGS) ×2 IMPLANT
DRSG TELFA 3X8 NADH (GAUZE/BANDAGES/DRESSINGS) ×2 IMPLANT
ELECT CAUTERY BLADE TIP 2.5 (TIP) ×4
ELECT REM PT RETURN 9FT ADLT (ELECTROSURGICAL) ×4
ELECTRODE CAUTERY BLDE TIP 2.5 (TIP) ×2 IMPLANT
ELECTRODE REM PT RTRN 9FT ADLT (ELECTROSURGICAL) ×2 IMPLANT
EXPANDER TISSUE CPX4 550CC (Expander) ×2 IMPLANT
GAUZE SPONGE 4X4 12PLY STRL (GAUZE/BANDAGES/DRESSINGS) ×2 IMPLANT
GLOVE BIOGEL M STRL SZ7.5 (GLOVE) ×4 IMPLANT
GLOVE BIOGEL PI IND STRL 8 (GLOVE) ×1 IMPLANT
GLOVE BIOGEL PI INDICATOR 8 (GLOVE) ×1
GLOVE ORTHO TXT STRL SZ7.5 (GLOVE) ×4 IMPLANT
GOWN STRL REUS W/ TWL LRG LVL3 (GOWN DISPOSABLE) ×7 IMPLANT
GOWN STRL REUS W/TWL LRG LVL3 (GOWN DISPOSABLE) ×14
GRAFT FLEX HD 19X22X0.7-1.4 (Tissue) ×2 IMPLANT
HEMOSTAT ARISTA ABSORB 3G PWDR (HEMOSTASIS) IMPLANT
IV NS 1000ML (IV SOLUTION)
IV NS 1000ML BAXH (IV SOLUTION) IMPLANT
IV NS 500ML (IV SOLUTION)
IV NS 500ML BAXH (IV SOLUTION) IMPLANT
KIT TURNOVER KIT A (KITS) ×4 IMPLANT
NDL SAFETY ECLIPSE 18X1.5 (NEEDLE) ×1 IMPLANT
NEEDLE 21 GA WING INFUSION (NEEDLE) IMPLANT
NEEDLE HYPO 18GX1.5 SHARP (NEEDLE) ×2
NEEDLE HYPO 22GX1.5 SAFETY (NEEDLE) ×2 IMPLANT
NS IRRIG 1000ML POUR BTL (IV SOLUTION) ×2 IMPLANT
PACK BASIN MAJOR ARMC (MISCELLANEOUS) ×4 IMPLANT
PAD PREP 24X41 OB/GYN DISP (PERSONAL CARE ITEMS) ×4 IMPLANT
PIN SAFETY STRL (MISCELLANEOUS) ×2 IMPLANT
PREVENA  PLUS INSCION MANAGEMENT SYSTEM ×2 IMPLANT
SET ASEPTIC TRANSFER (MISCELLANEOUS) ×2 IMPLANT
SHEARS HARMONIC 9CM CVD (BLADE) IMPLANT
SIZER BREAST REUSE 535CC (SIZER) ×2
SIZER BRST REUSE ULT HI 535CC (SIZER) ×1 IMPLANT
SLEVE PROBE SENORX GAMMA FIND (MISCELLANEOUS) IMPLANT
SPONGE LAP 18X18 RF (DISPOSABLE) ×2 IMPLANT
STAPLER INSORB 30 2030 C-SECTI (MISCELLANEOUS) ×2 IMPLANT
STAPLER SKIN PROX 35W (STAPLE) IMPLANT
STRIP CLOSURE SKIN 1/2X4 (GAUZE/BANDAGES/DRESSINGS) IMPLANT
SUT ETHILON 3-0 FS-10 30 BLK (SUTURE) ×4
SUT MNCRL AB 4-0 PS2 18 (SUTURE) ×4 IMPLANT
SUT PDS II 3-0 (SUTURE) ×2 IMPLANT
SUT PDS PLUS 2 (SUTURE) ×3
SUT PDS PLUS AB 2-0 CT-1 (SUTURE) ×3 IMPLANT
SUT SILK 2 0 (SUTURE) ×2
SUT SILK 2 0 SH (SUTURE) ×2 IMPLANT
SUT SILK 2-0 18XBRD TIE 12 (SUTURE) ×1 IMPLANT
SUT V-LOC 90 ABS 3-0 VLT  V-20 (SUTURE) ×1
SUT V-LOC 90 ABS 3-0 VLT V-20 (SUTURE) ×1 IMPLANT
SUT VIC AB 3-0 54X BRD REEL (SUTURE) ×1 IMPLANT
SUT VIC AB 3-0 BRD 54 (SUTURE) ×2
SUT VIC AB 3-0 SH 27 (SUTURE) ×4
SUT VIC AB 3-0 SH 27X BRD (SUTURE) ×2 IMPLANT
SUTURE EHLN 3-0 FS-10 30 BLK (SUTURE) ×2 IMPLANT
SWABSTK COMLB BENZOIN TINCTURE (MISCELLANEOUS) IMPLANT
SYR 10ML LL (SYRINGE) IMPLANT
SYR 20ML LL LF (SYRINGE) ×2 IMPLANT
SYR 5ML LL (SYRINGE) IMPLANT
SYR BULB IRRIG 60ML STRL (SYRINGE) ×6 IMPLANT
WATER STERILE IRR 1000ML POUR (IV SOLUTION) ×2 IMPLANT

## 2020-04-25 NOTE — Anesthesia Postprocedure Evaluation (Signed)
Anesthesia Post Note  Patient: Kirsten James  Procedure(s) Performed: SIMPLE MASTECTOMY WITH AXILLARY SENTINEL NODE BIOPSY (Right ) BREAST RECONSTRUCTION WITH PLACEMENT OF TISSUE EXPANDER AND FLEX HD (ACELLULAR HYDRATED DERMIS) (Right )  Patient location during evaluation: PACU Anesthesia Type: General Level of consciousness: awake and alert Pain management: pain level controlled Vital Signs Assessment: post-procedure vital signs reviewed and stable Respiratory status: spontaneous breathing, nonlabored ventilation and respiratory function stable Cardiovascular status: blood pressure returned to baseline and stable Postop Assessment: no apparent nausea or vomiting Anesthetic complications: no   No complications documented.   Last Vitals:  Vitals:   04/25/20 1822 04/25/20 1832  BP:  108/88  Pulse:  91  Resp:  16  Temp:    SpO2: 100% 100%    Last Pain:  Vitals:   04/25/20 1832  TempSrc:   PainSc: 0-No pain                 Brett Canales Rollyn Scialdone

## 2020-04-25 NOTE — Discharge Instructions (Signed)
AMBULATORY SURGERY  DISCHARGE INSTRUCTIONS   1) The drugs that you were given will stay in your system until tomorrow so for the next 24 hours you should not:  A) Drive an automobile B) Make any legal decisions C) Drink any alcoholic beverage   2) You may resume regular meals tomorrow.  Today it is better to start with liquids and gradually work up to solid foods.  You may eat anything you prefer, but it is better to start with liquids, then soup and crackers, and gradually work up to solid foods.   3) Please notify your doctor immediately if you have any unusual bleeding, trouble breathing, redness and pain at the surgery site, drainage, fever, or pain not relieved by medication.    4) Additional Instructions: please keep binder/ ace wrap in place.  Record JP drain output daily.        Please contact your physician with any problems or Same Day Surgery at [phone], Monday through Friday 6 am to 4 pm, or [place] at [facility] number at [phone].

## 2020-04-25 NOTE — Anesthesia Procedure Notes (Signed)
Procedure Name: Intubation Performed by: Fletcher-Harrison, Steaven Wholey, CRNA Pre-anesthesia Checklist: Patient identified, Emergency Drugs available, Suction available and Patient being monitored Patient Re-evaluated:Patient Re-evaluated prior to induction Oxygen Delivery Method: Circle system utilized Preoxygenation: Pre-oxygenation with 100% oxygen Induction Type: IV induction Ventilation: Mask ventilation without difficulty Laryngoscope Size: McGraph and 3 Grade View: Grade I Tube type: Oral Tube size: 6.5 mm Number of attempts: 1 Airway Equipment and Method: Stylet and Oral airway Placement Confirmation: ETT inserted through vocal cords under direct vision,  positive ETCO2,  breath sounds checked- equal and bilateral and CO2 detector Secured at: 21 cm Tube secured with: Tape Dental Injury: Teeth and Oropharynx as per pre-operative assessment        

## 2020-04-25 NOTE — Op Note (Addendum)
  Procedure Date:  04/25/2020  Pre-operative Diagnosis: Multicentric right breast cancer, ER positive.  Post-operative Diagnosis: Same.  Procedure: Right simple mastectomy and sentinel lymph node biopsy  Surgeon:  Ronny Bacon, M.D., [facility]  Anesthesia:  General endotracheal  Estimated Blood Loss: 25 ml  Specimens: Right breast, sentinel lymph node/s  Complications: None  Indications for Procedure:  This is a 54 y.o. adult who presents with 2 separate foci of right breast cancer in different quadrants of the breast.  MRI suspicious for potential additional foci.  The risks of bleeding, infection, injury to surrounding structures, hematoma, seroma, open wound, cosmetic deformity, and the need for further surgery were all discussed with the patient and was willing to proceed.  Prior to this procedure, the patient had undergone sentinel lymphoscintigraphy.  Description of Procedure: The patient was correctly identified in the preoperative area and brought into the operating room.  The patient was placed supine with VTE prophylaxis in place.  Appropriate time-outs were performed.  Anesthesia was induced and the patient was intubated.  Appropriate antibiotics were infused.  A visual dye, Lymphazurin blue, 4 mL was injected in the right periareolar region under aseptic conditions, massage administered for 5 minutes. The entire anterior chest and right axilla were prepped and draped in usual sterile fashion.   An elliptical incision, for skin sparing, was then completed over the breast encompassing the nipple-areolar complex.  Using electrosurgery, subcutaneous flaps were created superiorly to the clavicle, inferiorly to the inframammary fold, medially to the sternum, and laterally to the latissimus dorsi with careful attention to create flaps of adequate thickness.  The breast tissue was then dissected with the pectoralis fascia as the deep margin.  2-0 silk suture was used to mark the specimen as  short superior and long lateral.  The cavity was then irrigated and hemostasis was assured with electrocautery.    Sentinel Node Biopsy Synoptic Operative Report  Operation performed with curative intent:Yes  Tracer(s) used to identify sentinel nodes in the upfront surgery (non-neoadjuvant) setting (select all that apply):Dye and Radioactive Tracer  Tracer(s) used to identify sentinel nodes in the neoadjuvant setting (select all that apply):N/A  All nodes (colored or non-colored) present at the end of a dye-filled lymphatic channel were removed:Yes   All significantly radioactive nodes were removed:Yes  All palpable suspicious nodes were removed:N/A, there were no palpably suspicious nodes.  Biopsy-proven positive nodes marked with clips prior to chemotherapy were identified and removed:N/A   Then using the hand-held probe an area of high counts was identified in the axilla.  Cautery was used to dissect down through the axillary adipose tissue and the hand-held probe was used to guide dissection. A bluish and hot lymph node was identified and resected.  This had a count of over 3000.  Additional lymph nodes were identified and resected immediately adjacent to the first node encountered with similar counts.  The wound bed had a count of tens to 20s.  The cavity was irrigated and hemostasis was assured with electrocautery.    At this point I turned the rest of the procedure over to Dr. Claudia Desanctis for reconstruction.   Ronny Bacon M.D., FACS 04/25/2020, 5:07 PM

## 2020-04-25 NOTE — Op Note (Signed)
Operative Note   DATE OF OPERATION: 04/25/2020  SURGICAL DEPARTMENT: Plastic Surgery  PREOPERATIVE DIAGNOSES: Right breast cancer  POSTOPERATIVE DIAGNOSES:  same  PROCEDURE: 1.  Right breast reconstruction with tissue expander and acellular dermal matrix 2.  Indocyanine green angiography of right mastectomy flaps 3.  Wound VAC application to right chest  SURGEON: Talmadge Coventry, MD  ASSISTANT: Phoebe Sharps, PA The advanced practice practitioner (APP) assisted throughout the case.  The APP was essential in retraction and counter traction when needed to make the case progress smoothly.  This retraction and assistance made it possible to see the tissue plans for the procedure.  The assistance was needed for blood control, tissue re-approximation and assisted with closure of the incision site.  ANESTHESIA:  General.   COMPLICATIONS: None.   INDICATIONS FOR PROCEDURE:  The patient, Kirsten James is a 54 y.o. adult born on Jul 17, 1965, is here for treatment of right breast mastectomy defect MRN: [id number]  CONSENT:  Informed consent was obtained directly from the patient. Risks, benefits and alternatives were fully discussed. Specific risks including but not limited to bleeding, infection, hematoma, seroma, scarring, pain, contracture, asymmetry, wound healing problems, and need for further surgery were all discussed. The patient did have an ample opportunity to have questions answered to satisfaction.   DESCRIPTION OF PROCEDURE:  The patient was taken to the operating room. SCDs were placed and antibiotics were given.  General anesthesia was administered.  The patient's operative site was prepped and draped in a sterile fashion. A time out was performed and all information was confirmed to be correct.  Dr. Christian Mate performed a right mastectomy and sentinel node evaluation.  After he was finished he turned the patient over to me.  I examined mastectomy flaps and they look quite  good.  Indocyanine green angiography was performed showing mild concern for a centimeter to of the superior cystectomy flap and this was excised with a 15 blade and cautery and sent as specimen.  I then examined the rest of the wound and obtain meticulous hemostasis.  46 French drain was placed and secured with a nylon suture.  I then brought a silicone gel sizer onto the field as the patient had a desire to try to go direct implant.  I placed this in the wound and stapled the skin but felt that it was not enough volume to give her a close enough match with the other side.  I also felt a little bit uncomfortable going that big with direct implant as the gel sizer I tried was 535 cc.  I then elected to place a tissue expander.  The pocket was irrigated copiously with triple antibiotic solution.  A piece of Flex HD pliable pre medium size was brought onto the field and soaked in saline.  A 2-0 PDS was run circumferentially for a pursestring.  The expander I chose was a Mentor smooth CPX 4 medium height expander with a volume of 550 cc.  The serial number was [id number].  The air was removed from the expander and it was placed within the matrix and the pursestring was tied down.  Suture tabs were brought out inferiorly and laterally.  Stay sutures were then placed to the chest wall and the corresponding areas utilizing 2-0 PDS suture.  These were then put through the suture tabs and the expander was secured to the chest wall.  250 cc of saline were then injected into the expander.  This filled out the space reasonably well with  minimal to no tension on the skin.  Wound closure was then performed with a combination of 3-0 PDS and buried Enzor staplers and a running 3 oh VueLock.  The Praveena Bella wound VAC was then applied to the right chest and a good seal was obtained.  Chest was then wrapped in an Ace wrap  The patient tolerated the procedure well.  There were no complications. The patient was allowed to wake  from anesthesia, extubated and taken to the recovery room in satisfactory condition.

## 2020-04-25 NOTE — Transfer of Care (Signed)
Immediate Anesthesia Transfer of Care Note  Patient: Kirsten James  Procedure(s) Performed: SIMPLE MASTECTOMY WITH AXILLARY SENTINEL NODE BIOPSY (Right ) BREAST RECONSTRUCTION WITH PLACEMENT OF TISSUE EXPANDER AND FLEX HD (ACELLULAR HYDRATED DERMIS) (Right )  Patient Location: PACU  Anesthesia Type:General  Level of Consciousness: drowsy and patient cooperative  Airway & Oxygen Therapy: Patient Spontanous Breathing and Patient connected to face mask oxygen  Post-op Assessment: Report given to RN and Post -op Vital signs reviewed and stable  Post vital signs: Reviewed and stable  Last Vitals:  Vitals Value Taken Time  BP 116/69 04/25/20 1802  Temp    Pulse 80 04/25/20 1802  Resp 13 04/25/20 1802  SpO2 100 % 04/25/20 1802  Vitals shown include unvalidated device data.  Last Pain:  Vitals:   04/25/20 1133  TempSrc: Temporal  PainSc: 3          Complications: No complications documented.

## 2020-04-25 NOTE — Interval H&P Note (Signed)
History and Physical Interval Note:  04/25/2020 11:44 AM  Kirsten James  has presented today for surgery, with the diagnosis of Right breast cancer.  The various methods of treatment have been discussed with the patient and family. After consideration of risks, benefits and other options for treatment, the patient has consented to  Procedure(s): SIMPLE MASTECTOMY WITH AXILLARY SENTINEL NODE BIOPSY (Right) BREAST RECONSTRUCTION WITH PLACEMENT OF TISSUE EXPANDER AND FLEX HD (ACELLULAR HYDRATED DERMIS) (Right) as a surgical intervention.  The patient's history has been reviewed, patient examined, no change in status, stable for surgery.  I have reviewed the patient's chart and labs.  Questions were answered to the patient's satisfaction.   Right side is marked.   Ronny Bacon  Ronny Bacon, M.D., [facility]  04/25/2020 ; 11:44 AM

## 2020-04-26 ENCOUNTER — Encounter: Payer: Self-pay | Admitting: Surgery

## 2020-04-26 NOTE — Anesthesia Preprocedure Evaluation (Addendum)
Anesthesia Evaluation  Patient identified by MRN, date of birth, ID band Patient awake    Reviewed: Allergy & Precautions, H&P , NPO status , Patient's Chart, lab work & pertinent test results, reviewed documented beta blocker date and time   Airway Mallampati: II  TM Distance: >3 FB Neck ROM: full    Dental  (+) Teeth Intact   Pulmonary neg pulmonary ROS, former smoker,    Pulmonary exam normal        Cardiovascular Exercise Tolerance: Good hypertension, On Medications negative cardio ROS Normal cardiovascular exam Rhythm:regular Rate:Normal     Neuro/Psych negative neurological ROS  negative psych ROS   GI/Hepatic negative GI ROS, Neg liver ROS,   Endo/Other  negative endocrine ROS  Renal/GU negative Renal ROS  negative genitourinary   Musculoskeletal   Abdominal   Peds  Hematology negative hematology ROS (+)   Anesthesia Other Findings Past Medical History: No date: Allergy No date: Frequent urinary tract infections No date: Hypertension Past Surgical History: 04/05/2020: BREAST BIOPSY; Right     Comment:  [place] bx, Q shape marker 9:00, path pending 04/05/2020: BREAST BIOPSY; Right     Comment:  [place] bx, x shape marker 1:00, path pending No date: CHOLECYSTECTOMY No date: L4-5 herniated disc repair 2005: NASAL SINUS SURGERY No date: PLANTAR FASCIA RELEASE BMI    Body Mass Index: 31.75 kg/m     Reproductive/Obstetrics negative OB ROS                            Anesthesia Physical Anesthesia Plan  ASA: II  Anesthesia Plan: General ETT   Post-op Pain Management:    Induction:   PONV Risk Score and Plan:   Airway Management Planned:   Additional Equipment:   Intra-op Plan:   Post-operative Plan:   Informed Consent: I have reviewed the patients History and Physical, chart, labs and discussed the procedure including the risks, benefits and alternatives for the proposed  anesthesia with the patient or authorized representative who has indicated his/her understanding and acceptance.     Dental Advisory Given  Plan Discussed with: CRNA  Anesthesia Plan Comments:         Anesthesia Quick Evaluation

## 2020-04-27 ENCOUNTER — Other Ambulatory Visit: Payer: Self-pay | Admitting: Pathology

## 2020-04-28 ENCOUNTER — Encounter: Payer: Self-pay | Admitting: *Deleted

## 2020-04-28 NOTE — Progress Notes (Signed)
Called patient.  She is doing well post surgery.  Ready to get her drain out.  No needs at this time.  Verbal consent obtained for [place].

## 2020-05-04 ENCOUNTER — Inpatient Hospital Stay (HOSPITAL_BASED_OUTPATIENT_CLINIC_OR_DEPARTMENT_OTHER): Payer: BC Managed Care – PPO | Admitting: Hospice and Palliative Medicine

## 2020-05-04 ENCOUNTER — Other Ambulatory Visit: Payer: Self-pay

## 2020-05-04 ENCOUNTER — Encounter: Payer: Self-pay | Admitting: Plastic Surgery

## 2020-05-04 ENCOUNTER — Encounter: Payer: Self-pay | Admitting: Internal Medicine

## 2020-05-04 ENCOUNTER — Ambulatory Visit (INDEPENDENT_AMBULATORY_CARE_PROVIDER_SITE_OTHER): Payer: BC Managed Care – PPO | Admitting: Plastic Surgery

## 2020-05-04 VITALS — BP 141/91 | HR 89 | Temp 99.0°F

## 2020-05-04 DIAGNOSIS — C50811 Malignant neoplasm of overlapping sites of right female breast: Secondary | ICD-10-CM

## 2020-05-04 DIAGNOSIS — Z170 Estrogen receptor positive status [ER+]: Secondary | ICD-10-CM

## 2020-05-04 NOTE — Progress Notes (Signed)
Multidisciplinary Oncology Council Documentation  Kirsten James was presented by our [facility] on 05/04/2020, which included representatives from:   Palliative Care  Dietitian  Physical/Occupational Therapist  Speech Therapist  Survivorship Nurse  Nurse Navigator  Social work  Research RN  [employer] currently presents with history of breast cancer.  We reviewed previous medical and familial history, history of present illness, and recent lab results along with all available histopathologic and imaging studies. The [place] considered available treatment options and made the following recommendations/referrals:  Kirsten James, OT to speak with Dr. Rogue Bussing.   The MOC is a meeting of clinicians from various specialty areas who evaluate and discuss patients for whom a multidisciplinary approach is being considered. Final determinations in the plan of care are those of the provider(s).   Todays extended care, comprehensive team conference, Kirsten James was not present for the discussion and was not examined.

## 2020-05-04 NOTE — Progress Notes (Signed)
Patient is postop from a right-sided breast reconstruction.  She overall feels like she is doing great.  On exam her skin is healing nicely with a little bit of irritation from the [facility] tape but otherwise looks nicely viable.  The expander is in good position.  The drain has been putting out 20 to 30 cc for the last 3 days so it was removed.  We will plan to give her another week to heal up and continue to wear compressive garment and can likely start fills next week.  All her questions were answered.

## 2020-05-05 ENCOUNTER — Telehealth: Payer: Self-pay | Admitting: Internal Medicine

## 2020-05-05 NOTE — Telephone Encounter (Signed)
C- please schedule appt next week- MD; No labs.  --------------------------------------------------------- Hi- I got your earlier message.  I would recommend follow-up in the cancer center next week. I have left a voice mail earlier. My staff will reach out to re: appt. Thanks Dr.B

## 2020-05-09 ENCOUNTER — Encounter: Payer: Self-pay | Admitting: Internal Medicine

## 2020-05-09 ENCOUNTER — Inpatient Hospital Stay (HOSPITAL_BASED_OUTPATIENT_CLINIC_OR_DEPARTMENT_OTHER): Payer: BC Managed Care – PPO | Admitting: Internal Medicine

## 2020-05-09 ENCOUNTER — Other Ambulatory Visit: Payer: Self-pay

## 2020-05-09 DIAGNOSIS — Z9011 Acquired absence of right breast and nipple: Secondary | ICD-10-CM

## 2020-05-09 DIAGNOSIS — Z170 Estrogen receptor positive status [ER+]: Secondary | ICD-10-CM | POA: Diagnosis not present

## 2020-05-09 DIAGNOSIS — C50211 Malignant neoplasm of upper-inner quadrant of right female breast: Secondary | ICD-10-CM | POA: Diagnosis not present

## 2020-05-09 DIAGNOSIS — C50811 Malignant neoplasm of overlapping sites of right female breast: Secondary | ICD-10-CM

## 2020-05-09 HISTORY — DX: Acquired absence of right breast and nipple: Z90.11

## 2020-05-09 MED ORDER — CITALOPRAM HYDROBROMIDE 20 MG PO TABS
20.0000 mg | ORAL_TABLET | Freq: Every day | ORAL | 3 refills | Status: DC
Start: 2020-05-09 — End: 2020-05-23

## 2020-05-09 NOTE — Progress Notes (Signed)
Pt in for follow up, reports some mild discomfort in right mastectomy site.

## 2020-05-09 NOTE — Progress Notes (Signed)
Patient is a 54 year old who presents today for follow-up after undergoing a right simple mastectomy with sentinel lymph node biopsy with Dr. Christian Mate and placement of tissue expander and Flex HD with Dr. Claudia Desanctis on 04/25/2020.  ~ 2 weeks PO Patient reports she is doing very well.  No complaints.  Right breast incision is healing nicely, C/D/I.  No signs of infection, redness, drainage, seroma/hematoma.  She has a good amount of skin laxity.  She would like a fill today.  She is hoping to be able to exchange to implants before the end of the year if at all possible.  She is happy to come in for weekly fills if needed to make this happen.  She denies fever/chills, nausea/vomiting.  We placed injectable saline in the Expander using a sterile technique: Right: 100 cc for a total of 350 / 550 cc  Continue to wear compression garment 24/7.  May shower normally.  Continue to avoid heavy lifting.  Follow-up in 1 week for possible fill.  Call office with any questions/concerns.

## 2020-05-09 NOTE — Assessment & Plan Note (Addendum)
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  S/p simple mastectomy followed by immediate reconstruction.  Final pathology reveals- 2 foci-9:00-7 mm; 1:00-13 millimeter; sentinel lymph node negative.  #Given the small size of the tumors; s/p mastectomy-no role for any radiation.  #Regards to adjuvant systemic therapy we will plan Oncotype-on the larger specimen.  We will also review the tumor conference today.  Discussed the role of Oncotype; and the risk of recurrence.  Also discussed the results would help percent [place] making shared decisions regarding the benefit of chemotherapy.  Patient will be candidate for antihormone therapy.   # Hot flashes-postmenopausal; currently on celexa [~ 1week]; tolerating well.  Continue celexa; new 90-day script.   # DISPOSITION: # Oncotype- breast; will order online in the afternoon # follow up in 3 weeks-MD; no labs Dr.B

## 2020-05-09 NOTE — Progress Notes (Signed)
[facility] CONSULT NOTE  Patient Care Team: Flinchum, Kelby Aline, FNP as PCP - General (Family Medicine)  CHIEF COMPLAINTS/PURPOSE OF CONSULTATION:  Breast cancer  #  Oncology History Overview Note  # Right breast upper outer- IMC; ER/PR-POSITIVE; Her- 2 NEG; G-2; s/p Lumpectomy- mpT Category: pT1c [3m-1'O & 753m9'O pN0; NEGATIVE MARGINS;    # # SURVIVORSHIP:   # GENETICS:   DIAGNOSIS:   STAGE:         ;  GOALS:  CURRENT/MOST RECENT THERAPY :     Malignant neoplasm of upper-inner quadrant of right female breast ([place]  04/12/2020 Initial Diagnosis   Malignant neoplasm of upper-inner quadrant of right female breast ([facility]  Carcinoma of overlapping sites of right breast in female, estrogen receptor positive ([facility] 04/20/2020 Initial Diagnosis   Carcinoma of overlapping sites of right breast in female, estrogen receptor positive ([facility]     HISTORY OF PRESENTING ILLNESS:  ChPollyanna Levay54.o.  adult pleasant postmenopausal with above history of breast cancer is here for a follow-up post surgery.  Patient denies any complications from her surgery.  She is healing fairly well.  Continues have hot flashes.  She was weaned off Wellbutrin.  And she is currently taking Celexa for the last 1 week.  Review of Systems  Constitutional: Negative for chills, diaphoresis, fever, malaise/fatigue and weight loss.  HENT: Negative for nosebleeds and sore throat.   Eyes: Negative for double vision.  Respiratory: Negative for cough, hemoptysis, sputum production, shortness of breath and wheezing.   Cardiovascular: Negative for chest pain, palpitations, orthopnea and leg swelling.  Gastrointestinal: Negative for abdominal pain, blood in stool, constipation, diarrhea, heartburn, melena, nausea and vomiting.  Genitourinary: Negative for dysuria, frequency and urgency.  Musculoskeletal: Negative for back pain and joint pain.  Skin: Negative.  Negative for  itching and rash.  Neurological: Negative for dizziness, tingling, focal weakness, weakness and headaches.  Endo/Heme/Allergies: Does not bruise/bleed easily.  Psychiatric/Behavioral: Negative for depression. The patient is not nervous/anxious and does not have insomnia.      MEDICAL HISTORY:  Past Medical History:  Diagnosis Date  . Allergy   . Frequent urinary tract infections   . Hypertension     SURGICAL HISTORY: Past Surgical History:  Procedure Laterality Date  . BREAST BIOPSY Right 04/05/2020   [place], Q shape marker 9:00, path pending  . BREAST BIOPSY Right 04/05/2020   [place], x shape marker 1:00, path pending  . BREAST RECONSTRUCTION WITH PLACEMENT OF TISSUE EXPANDER AND FLEX HD (ACELLULAR HYDRATED DERMIS) Right 04/25/2020   Procedure: BREAST RECONSTRUCTION WITH PLACEMENT OF TISSUE EXPANDER AND FLEX HD (ACELLULAR HYDRATED DERMIS);  Surgeon: PaCindra PresumeMD;  Location: [facility] ORS;  Service: Plastics;  Laterality: Right;  . CHOLECYSTECTOMY    . L4-5 herniated disc repair    . NASAL SINUS SURGERY  2005  . PLANTAR FASCIA RELEASE    . SIMPLE MASTECTOMY WITH AXILLARY SENTINEL NODE BIOPSY Right 04/25/2020   Procedure: SIMPLE MASTECTOMY WITH AXILLARY SENTINEL NODE BIOPSY;  Surgeon: RoRonny BaconMD;  Location: [facility] ORS;  Service: General;  Laterality: Right;    SOCIAL HISTORY: Social History   Socioeconomic History  . Marital status: Married    Spouse name: Not on file  . Number of children: Not on file  . Years of education: Not on file  . Highest education level: Not on file  Occupational History  . Not on file  Tobacco Use  . Smoking  status: Former Smoker    Quit date: 10/12/2019    Years since quitting: 0.5  . Smokeless tobacco: Never Used  Substance and Sexual Activity  . Alcohol use: Never  . Drug use: Never  . Sexual activity: Not Currently  Other Topics Concern  . Not on file  Social History Narrative   Quit smoking [teen- 1995; quit]; no alcohol.  Lives in [place] by self; daughter in [place]. RN- Scientist, research (medical).    Social Determinants of Health   Financial Resource Strain:   . Difficulty of Paying Living Expenses: Not on file  Food Insecurity:   . Worried About Charity fundraiser in the Last Year: Not on file  . Ran Out of Food in the Last Year: Not on file  Transportation Needs:   . Lack of Transportation (Medical): Not on file  . Lack of Transportation (Non-Medical): Not on file  Physical Activity:   . Days of Exercise per Week: Not on file  . Minutes of Exercise per Session: Not on file  Stress:   . Feeling of Stress : Not on file  Social Connections:   . Frequency of Communication with Friends and Family: Not on file  . Frequency of Social Gatherings with Friends and Family: Not on file  . Attends Religious Services: Not on file  . Active Member of Clubs or Organizations: Not on file  . Attends Archivist Meetings: Not on file  . Marital Status: Not on file  Intimate Partner Violence:   . Fear of Current or Ex-Partner: Not on file  . Emotionally Abused: Not on file  . Physically Abused: Not on file  . Sexually Abused: Not on file    FAMILY HISTORY: Family History  Problem Relation Age of Onset  . Cancer Mother        non-small cell lung cancer- smoker  . Cancer Father        oral & larynx- smoker  . Alzheimer's disease Maternal Grandmother   . Melanoma Maternal Uncle     ALLERGIES:  is allergic to amoxicillin, other, and penicillins.  MEDICATIONS:  Current Outpatient Medications  Medication Sig Dispense Refill  . Cholecalciferol (VITAMIN D3) 125 MCG (5000 UT) TABS Take 5,000 Units by mouth daily.    . citalopram (CELEXA) 20 MG tablet Take 1 tablet (20 mg total) by mouth daily. 90 tablet 3  . diltiazem (CARTIA XT) 240 MG 24 hr capsule Cartia XT 180 mg capsule,extended release Take 1 capsule every day by oral route. (Patient taking differently: Take 240 mg by mouth daily. ) 90  capsule 0  . FIBER PO Take 1 capsule by mouth daily.    . fluticasone (FLONASE) 50 MCG/ACT nasal spray Place 1 spray into both nostrils daily.     . Multiple Vitamin (MULTIVITAMIN WITH MINERALS) TABS tablet Take 1 tablet by mouth daily.    . Omega 3 1000 MG CAPS Take 1,000 mg by mouth daily.     Marland Kitchen sulfamethoxazole-trimethoprim (BACTRIM DS) 800-160 MG tablet Take 1 tablet by mouth 2 (two) times daily for 14 days. For use AFTER surgery 28 tablet 0  . Turmeric Curcumin 500 MG CAPS Take 500 mg by mouth daily.     . nitrofurantoin (MACRODANTIN) 50 MG capsule Take 50 mg by mouth daily as needed (uti symptoms/after sexual intercourse.).  (Patient not taking: Reported on 05/09/2020)     No current facility-administered medications for this visit.      Marland Kitchen  PHYSICAL EXAMINATION: ECOG  PERFORMANCE STATUS: 0 - Asymptomatic  Vitals:   05/09/20 0851  BP: (!) 162/89  Pulse: 84  Resp: 18  Temp: 97.6 F (36.4 C)  SpO2: 100%   Filed Weights   05/09/20 0851  Weight: 216 lb 12.8 oz (98.3 kg)    Physical Exam HENT:     Head: Normocephalic and atraumatic.     Mouth/Throat:     Pharynx: No oropharyngeal exudate.  Eyes:     Pupils: Pupils are equal, round, and reactive to light.  Cardiovascular:     Rate and Rhythm: Normal rate and regular rhythm.  Pulmonary:     Effort: Pulmonary effort is normal. No respiratory distress.     Breath sounds: Normal breath sounds. No wheezing.  Abdominal:     General: Bowel sounds are normal. There is no distension.     Palpations: Abdomen is soft. There is no mass.     Tenderness: There is no abdominal tenderness. There is no guarding or rebound.  Musculoskeletal:        General: No tenderness. Normal range of motion.     Cervical back: Normal range of motion and neck supple.  Skin:    General: Skin is warm.  Neurological:     Mental Status: He is alert and oriented to person, place, and time.  Psychiatric:        Mood and Affect: Affect normal.       LABORATORY DATA:  I have reviewed the data as listed Lab Results  Component Value Date   WBC 7.7 02/18/2020   HGB 14.6 02/18/2020   HCT 44.9 02/18/2020   MCV 87 02/18/2020   PLT 300 02/18/2020   Recent Labs    09/21/19 0000 02/18/20 0843  NA 139 138  K 4.3 4.6  CL 101 98  CO2 23 23  GLUCOSE 95 90  BUN 10 13  CREATININE 0.67 0.80  CALCIUM 9.8 9.4  GFRNONAA 101 84  GFRAA 116 97  PROT 7.3 7.2  ALBUMIN 4.7 4.7  AST 21 20  ALT 24 24  ALKPHOS 99 115  BILITOT 0.4 <0.2    RADIOGRAPHIC STUDIES: I have personally reviewed the radiological images as listed and agreed with the findings in the report. MR Breast Bilateral W Wo Contrast  Result Date: 04/15/2020 CLINICAL DATA:  54 year old female for staging of recently diagnosed cancer in the OUTER RIGHT breast and UPPER INNER RIGHT breast. LABS:  None performed today EXAM: BILATERAL BREAST MRI WITH AND WITHOUT CONTRAST TECHNIQUE: Multiplanar, multisequence MR images of both breasts were obtained prior to and following the intravenous administration of 9 ml of Gadavist Three-dimensional MR images were rendered by post-processing of the original MR data on an independent workstation. The three-dimensional MR images were interpreted, and findings are reported in the following complete MRI report for this study. Three dimensional images were evaluated at the independent interpreting workstation using the DynaCAD thin client. COMPARISON:  Prior mammograms and ultrasounds FINDINGS: Breast composition: c. Heterogeneous fibroglandular tissue. Background parenchymal enhancement: Mild Right breast: Biopsy clip artifact within the anterior OUTER RIGHT breast is noted with minimal adjacent enhancement consistent with biopsy-proven malignancy/post biopsy changes (series 10: Image 51). A 0.8 cm mass located 2 cm anterior/MEDIAL/INFERIOR to the above biopsy clip artifact is noted (10:44). A 0.6 cm mass located 1.5 cm posterior/LATERAL/SUPERIOR to  the above biopsy clip artifact is noted (10:59). Biopsy clip artifact within the UPPER INNER RIGHT breast, middle depth, is noted with minimal adjacent enhancement consistent with biopsy-proven malignancy/post  biopsy changes (10:75). Left breast: No mass or abnormal enhancement. Lymph nodes: No abnormal appearing lymph nodes. Ancillary findings:  None. IMPRESSION: 1. Biopsy clip artifact with adjacent enhancement within the OUTER RIGHT breast and biopsy clip artifact with adjacent enhancement within the UPPER INNER RIGHT breast, compatible with post biopsy changes/biopsy-proven malignancy. 2. Indeterminate 0.8 cm mass located 2 cm from the OUTER RIGHT breast malignancy/biopsy clip artifact and indeterminate 0.6 cm mass located 1.5 cm from the OUTER RIGHT breast malignancy/biopsy clip artifact. 2nd-look ultrasound is recommended. If these masses are not visualized sonographically, than MR guided biopsy is recommended if breast conservation is desired. 3. No abnormal appearing lymph nodes. 4. No MR evidence of LEFT breast malignancy. RECOMMENDATION: 2nd-look ultrasound for 2 separate masses within the OUTER RIGHT breast as discussed above. If breast conservation is desired and these masses are not visualized sonographically, than MR guided biopsies would be recommended. BI-RADS CATEGORY  4: Suspicious. Electronically Signed   By: Margarette Canada M.D.   On: 04/15/2020 16:11   NM SENTINEL NODE INJECTION  Result Date: 04/25/2020 CLINICAL DATA:  Right breast cancer. EXAM: NUCLEAR MEDICINE BREAST LYMPHOSCINTIGRAPHY TECHNIQUE: Intradermal injection of radiopharmaceutical was performed at the 12 o'clock, 3 o'clock, 6 o'clock, and 9 o'clock positions around the right nipple. The patient was then sent to the operating room where the sentinel node(s) were identified and removed by the surgeon. RADIOPHARMACEUTICALS:  Total of 0.624 mCi Millipore-filtered Technetium-35msulfur colloid, injected in four equal aliquots.  IMPRESSION: Uncomplicated intradermal injection of a total of 0.624 mCi Technetium-999mulfur colloid for purposes of sentinel node identification. Electronically Signed   By: NiDavina Poke.O.   On: 04/25/2020 12:00    ASSESSMENT & PLAN:   Carcinoma of overlapping sites of right breast in female, estrogen receptor positive ([place]#Right breast-invasive mammary carcinoma-x2 foci [9:00 and 2 o'clock position]-9 o'clock position-invasive mammary carcinoma ER/PR positive HER-2 negative.  S/p simple mastectomy followed by immediate reconstruction.  Final pathology reveals- 2 foci-9:00-7 mm; 1:00-13 millimeter; sentinel lymph node negative.  #Given the small size of the tumors; s/p mastectomy-no role for any radiation.  #Regards to adjuvant systemic therapy we will plan Oncotype-on the larger specimen.  We will also review the tumor conference today.  Discussed the role of Oncotype; and the risk of recurrence.  Also discussed the results would help percent [place] shared decisions regarding the benefit of chemotherapy.  Patient will be candidate for antihormone therapy.   # Hot flashes-postmenopausal; currently on celexa [~ 1week]; tolerating well.  Continue celexa; new 90-day script.   # DISPOSITION: # Oncotype- breast; will order online in the afternoon # follow up in 3 weeks-MD; no labs Dr.B     All questions were answered. The patient knows to call the clinic with any problems, questions or concerns.       GoCammie SickleMD 05/09/2020 9:19 AM

## 2020-05-11 ENCOUNTER — Encounter: Payer: Self-pay | Admitting: Plastic Surgery

## 2020-05-11 ENCOUNTER — Other Ambulatory Visit: Payer: Self-pay

## 2020-05-11 ENCOUNTER — Ambulatory Visit (INDEPENDENT_AMBULATORY_CARE_PROVIDER_SITE_OTHER): Payer: BC Managed Care – PPO | Admitting: Plastic Surgery

## 2020-05-11 VITALS — BP 159/86 | HR 80 | Temp 99.5°F

## 2020-05-11 DIAGNOSIS — Z9011 Acquired absence of right breast and nipple: Secondary | ICD-10-CM

## 2020-05-12 ENCOUNTER — Telehealth: Payer: Self-pay | Admitting: Internal Medicine

## 2020-05-12 LAB — SURGICAL PATHOLOGY

## 2020-05-12 NOTE — Telephone Encounter (Signed)
Oncotype order sent for patient.

## 2020-05-12 NOTE — Telephone Encounter (Signed)
T- please order oncotype. I can help you with biopsy report, if you need help this afternoon.

## 2020-05-19 ENCOUNTER — Telehealth: Payer: Self-pay | Admitting: *Deleted

## 2020-05-19 NOTE — Telephone Encounter (Signed)
-----   Message from Clancy Gourd sent at 05/19/2020  7:08 AM EDT ----- Regarding: STATEMENT OF MED NECESSITY ONCOTYPE DX Hello,  I just uploaded a [place] DX to be completed into this pt's chart in Epic under the Media tab. Thanks.

## 2020-05-19 NOTE — Telephone Encounter (Signed)
Forms completed received and faxed back to oncotype x 2. Fax confirmation rcvd both times.

## 2020-05-23 ENCOUNTER — Encounter: Payer: Self-pay | Admitting: Surgical

## 2020-05-23 ENCOUNTER — Ambulatory Visit (INDEPENDENT_AMBULATORY_CARE_PROVIDER_SITE_OTHER): Payer: BC Managed Care – PPO | Admitting: Surgical

## 2020-05-23 ENCOUNTER — Telehealth: Payer: Self-pay | Admitting: *Deleted

## 2020-05-23 ENCOUNTER — Other Ambulatory Visit: Payer: Self-pay

## 2020-05-23 VITALS — BP 168/98 | HR 79 | Temp 98.8°F

## 2020-05-23 DIAGNOSIS — Z9011 Acquired absence of right breast and nipple: Secondary | ICD-10-CM

## 2020-05-23 MED ORDER — TRIAMCINOLONE ACETONIDE 0.025 % EX OINT: 1.0000 "application " | TOPICAL_OINTMENT | Freq: Two times a day (BID) | CUTANEOUS | 0 refills | Status: DC

## 2020-05-23 NOTE — Progress Notes (Signed)
Patient is a 54 year old female here for follow-up on her breast reconstruction.  She underwent right simple mastectomy with sentinel lymph node biopsy and placement of tissue expander/Flex HD on 04/25/2020.  She is currently scheduled for removal of the tissue expander and placement of an implant on 06/14/2020.  She currently has 350 cc in her right breast expander.  Patient is doing well today, reports no problems after her last fill.  She is interested in an additional fill.  She does report that she has continued to have some itching from what she believes was the [facility] wound VAC tape.  She has been applying some ointments with minimal relief.  Chaperone present on exam Right breast incision intact, right breast expander in place.  No erythema.  Mild maculopapular rash noted along medial breast border.   Steroid cream sent to pharmacy for patient to apply to rash around breast.  Recommend avoiding over the incision. There is no sign of infection. She is scheduled for preop in approximately 1 week to further discuss surgery and for potential fill.  Pictures were obtained of the patient and placed in the chart with the patient's or guardian's permission.   We placed injectable saline in the Expander using a sterile technique: Right: 150 cc for a total of 500/550 cc

## 2020-05-23 NOTE — Telephone Encounter (Signed)
Faxed additional specimen date/ ID information to oncotype

## 2020-05-23 NOTE — Telephone Encounter (Signed)
-----   Message from [place] sent at 05/22/2020  1:18 PM EST ----- Regarding: [place] Team,   [employer] scanned to media tab for review.  Thanks, [employer]

## 2020-05-24 ENCOUNTER — Telehealth: Payer: Self-pay | Admitting: *Deleted

## 2020-05-24 ENCOUNTER — Encounter: Payer: Self-pay | Admitting: Internal Medicine

## 2020-05-24 NOTE — Telephone Encounter (Addendum)
I personally contacted the [employer] to clarify the need for PA and status of specimen. oncotype has not yet rcvd specimen from [facility]. They have submitted 2 request to [facility] and will follow-up with the path dept.  Our office has received 3 faxes for a PA. Dr. Rogue Bussing only wants the specimen for oncotype- # CASE: [id number]  For oncotype. I clarified that the PA is needed for specimen # CASE: [id number]. PA resubmitted. Patient updated via mychart.

## 2020-05-27 ENCOUNTER — Telehealth: Payer: Self-pay | Admitting: Internal Medicine

## 2020-05-27 NOTE — Telephone Encounter (Signed)
On 11/11-spoke to patient regarding the delay in Oncotype testing.  Patient understands.  Will follow up as planned in December/will call patient if results available sooner

## 2020-05-29 NOTE — Progress Notes (Signed)
ICD-10-CM   1. S/P mastectomy, right  Z90.11       Patient ID: Kirsten James, female    DOB: 25-Mar-1966, 54 y.o.   MRN: [id number]   History of Present Illness: Kirsten James is a 54 y.o.  female  with a history of right side breast cancer.  She presents for preoperative evaluation for upcoming procedure, removal of right breast expander with placement of implant and left breast lift/reduction for symmetry, scheduled for 06/14/2020 with Dr. Claudia Desanctis.  Summary from previous visit: Patient underwent right simple mastectomy with sentinel lymph node biopsy and placement of tissue expander/Flex HD on 04/25/2020.  She is currently scheduled for removal of the tissue expander and placement of an implant on 06/14/2020.    She has 550 cc of injectable saline in the expander currently.   PMH Significant for: Right side breast cancer, HTN, allergies  The patient has not had problems with anesthesia.   Past Medical History: Allergies: Allergies  Allergen Reactions  . Amoxicillin Hives  . Other     Allergy to pollen and mold- patient reports watery eyes, runny nose and sneezing as reaction  . Penicillins Hives    Current Medications:  Current Outpatient Medications:  .  buPROPion (WELLBUTRIN XL) 150 MG 24 hr tablet, Take 150 mg by mouth daily., Disp: , Rfl:  .  Cholecalciferol (VITAMIN D3) 125 MCG (5000 UT) TABS, Take 5,000 Units by mouth daily., Disp: , Rfl:  .  diltiazem (CARTIA XT) 240 MG 24 hr capsule, Cartia XT 180 mg capsule,extended release Take 1 capsule every day by oral route. (Patient taking differently: Take 240 mg by mouth daily. ), Disp: 90 capsule, Rfl: 0 .  FIBER PO, Take 1 capsule by mouth daily., Disp: , Rfl:  .  fluticasone (FLONASE) 50 MCG/ACT nasal spray, Place 1 spray into both nostrils daily. , Disp: , Rfl:  .  Multiple Vitamin (MULTIVITAMIN WITH MINERALS) TABS tablet, Take 1 tablet by mouth daily., Disp: , Rfl:  .  nitrofurantoin (MACRODANTIN) 50 MG  capsule, Take 50 mg by mouth daily as needed (uti symptoms/after sexual intercourse.). , Disp: , Rfl:  .  Turmeric Curcumin 500 MG CAPS, Take 500 mg by mouth daily. , Disp: , Rfl:   Past Medical Problems: Past Medical History:  Diagnosis Date  . Allergy   . Frequent urinary tract infections   . Hypertension     Past Surgical History: Past Surgical History:  Procedure Laterality Date  . BREAST BIOPSY Right 04/05/2020   [place] bx, Q shape marker 9:00, path pending  . BREAST BIOPSY Right 04/05/2020   [place] bx, x shape marker 1:00, path pending  . BREAST RECONSTRUCTION WITH PLACEMENT OF TISSUE EXPANDER AND FLEX HD (ACELLULAR HYDRATED DERMIS) Right 04/25/2020   Procedure: BREAST RECONSTRUCTION WITH PLACEMENT OF TISSUE EXPANDER AND FLEX HD (ACELLULAR HYDRATED DERMIS);  Surgeon: [name], MD;  Location: [facility];  Service: Plastics;  Laterality: Right;  . CHOLECYSTECTOMY    . L4-5 herniated disc repair    . NASAL SINUS SURGERY  2005  . PLANTAR FASCIA RELEASE    . SIMPLE MASTECTOMY WITH AXILLARY SENTINEL NODE BIOPSY Right 04/25/2020   Procedure: SIMPLE MASTECTOMY WITH AXILLARY SENTINEL NODE BIOPSY;  Surgeon: Ronny Bacon, MD;  Location: [facility];  Service: General;  Laterality: Right;    Social History: Social History   Socioeconomic History  . Marital status: Married    Spouse name: Not on file  . Number of children: Not on  file  . Years of education: Not on file  . Highest education level: Not on file  Occupational History  . Not on file  Tobacco Use  . Smoking status: Former Smoker    Quit date: 10/12/2019    Years since quitting: 0.6  . Smokeless tobacco: Never Used  Substance and Sexual Activity  . Alcohol use: Never  . Drug use: Never  . Sexual activity: Not Currently  Other Topics Concern  . Not on file  Social History Narrative   Quit smoking [teen- 1995; quit]; no alcohol. Lives in [place] by self; daughter in [place]. RN- Scientist, research (medical).     Social Determinants of Health   Financial Resource Strain:   . Difficulty of Paying Living Expenses: Not on file  Food Insecurity:   . Worried About Charity fundraiser in the Last Year: Not on file  . Ran Out of Food in the Last Year: Not on file  Transportation Needs:   . Lack of Transportation (Medical): Not on file  . Lack of Transportation (Non-Medical): Not on file  Physical Activity:   . Days of Exercise per Week: Not on file  . Minutes of Exercise per Session: Not on file  Stress:   . Feeling of Stress : Not on file  Social Connections:   . Frequency of Communication with Friends and Family: Not on file  . Frequency of Social Gatherings with Friends and Family: Not on file  . Attends Religious Services: Not on file  . Active Member of Clubs or Organizations: Not on file  . Attends Archivist Meetings: Not on file  . Marital Status: Not on file  Intimate Partner Violence:   . Fear of Current or Ex-Partner: Not on file  . Emotionally Abused: Not on file  . Physically Abused: Not on file  . Sexually Abused: Not on file    Family History: Family History  Problem Relation Age of Onset  . Cancer Mother        non-small cell lung cancer- smoker  . Cancer Father        oral & larynx- smoker  . Alzheimer's disease Maternal Grandmother   . Melanoma Maternal Uncle     Review of Systems: Review of Systems  Constitutional: Negative for chills and fever.  HENT: Negative for congestion and sore throat.   Respiratory: Negative for cough and shortness of breath.   Cardiovascular: Negative for chest pain and palpitations.  Gastrointestinal: Negative for abdominal pain, nausea and vomiting.  Skin: Negative for itching and rash.    Physical Exam: Vital Signs BP (!) 141/80 (BP Location: Left Arm, Patient Position: Sitting, Cuff Size: Large)   Pulse 82   Temp 98.6 F (37 C) (Oral)   Ht 5\' 9"  (1.753 m)   Wt 219 lb 12.8 oz (99.7 kg)   SpO2 99%   BMI 32.46  kg/m  Physical Exam Constitutional:      General: She is not in acute distress.    Appearance: Normal appearance. She is not ill-appearing.  HENT:     Head: Normocephalic and atraumatic.  Eyes:     Extraocular Movements: Extraocular movements intact.  Cardiovascular:     Rate and Rhythm: Normal rate and regular rhythm.     Pulses: Normal pulses.     Heart sounds: Normal heart sounds.  Pulmonary:     Effort: Pulmonary effort is normal.     Breath sounds: Normal breath sounds. No wheezing, rhonchi or rales.  Abdominal:     General: Bowel sounds are normal.     Palpations: Abdomen is soft.  Musculoskeletal:        General: No swelling. Normal range of motion.     Cervical back: Normal range of motion.  Skin:    General: Skin is warm and dry.     Coloration: Skin is not pale.     Findings: No erythema or rash.  Neurological:     General: No focal deficit present.     Mental Status: She is alert and oriented to person, place, and time.  Psychiatric:        Mood and Affect: Mood normal.        Behavior: Behavior normal.        Thought Content: Thought content normal.        Judgment: Judgment normal.     Assessment/Plan:  Ms. Spainhower scheduled for removal of right breast expander with placement of implant with Dr. Claudia Desanctis.  Risks, benefits, and alternatives of procedure discussed, questions answered and consent obtained.    Smoking Status: non-smoker; Counseling Given? N/A Last Mammogram: 04/15/20; Results: Right breast cancer; Left breast mp mass or abnormal enhancement.  Caprini Score: High; Risk Factors include: 54 year old female, Hx cancer, BMI > 25, and length of planned surgery. Recommendation for mechanical and pharmacological prophylaxis during surgery. Encourage early ambulation.   Pictures obtained: 05/23/2020  Post-op Rx sent to pharmacy: none -patient reports she did not use the Zofran or prescription pain medication after her last surgery and so does not feel  she needs any further surgery.  She will manage again on Tylenol and ibuprofen and call us if something additional as needed.  Patient was provided with the General Surgical Risk consent document and Pain Medication Agreement prior to their appointment.  They had adequate time to read through the risk consent documents and Pain Medication Agreement. We also discussed them in person together during this preop appointment. All of their questions were answered to their satisfaction.  Recommended calling if they have any further questions.  Risk consent form and Pain Medication Agreement to be scanned into patient's chart.  Electronically signed by: Threasa Heads, PA-C 06/01/2020 5:04 PM

## 2020-05-29 NOTE — H&P (View-Only) (Signed)
ICD-10-CM   1. S/P mastectomy, right  Z90.11       Patient ID: Kirsten James, female    DOB: January 01, 1966, 54 y.o.   MRN: [id number]   History of Present Illness: Kirsten James is a 54 y.o.  female  with a history of right side breast cancer.  She presents for preoperative evaluation for upcoming procedure, removal of right breast expander with placement of implant and left breast lift/reduction for symmetry, scheduled for 06/14/2020 with Dr. Claudia Desanctis.  Summary from previous visit: Patient underwent right simple mastectomy with sentinel lymph node biopsy and placement of tissue expander/Flex HD on 04/25/2020.  She is currently scheduled for removal of the tissue expander and placement of an implant on 06/14/2020.    She has 550 cc of injectable saline in the expander currently.   PMH Significant for: Right side breast cancer, HTN, allergies  The patient has not had problems with anesthesia.   Past Medical History: Allergies: Allergies  Allergen Reactions  . Amoxicillin Hives  . Other     Allergy to pollen and mold- patient reports watery eyes, runny nose and sneezing as reaction  . Penicillins Hives    Current Medications:  Current Outpatient Medications:  .  buPROPion (WELLBUTRIN XL) 150 MG 24 hr tablet, Take 150 mg by mouth daily., Disp: , Rfl:  .  Cholecalciferol (VITAMIN D3) 125 MCG (5000 UT) TABS, Take 5,000 Units by mouth daily., Disp: , Rfl:  .  diltiazem (CARTIA XT) 240 MG 24 hr capsule, Cartia XT 180 mg capsule,extended release Take 1 capsule every day by oral route. (Patient taking differently: Take 240 mg by mouth daily. ), Disp: 90 capsule, Rfl: 0 .  FIBER PO, Take 1 capsule by mouth daily., Disp: , Rfl:  .  fluticasone (FLONASE) 50 MCG/ACT nasal spray, Place 1 spray into both nostrils daily. , Disp: , Rfl:  .  Multiple Vitamin (MULTIVITAMIN WITH MINERALS) TABS tablet, Take 1 tablet by mouth daily., Disp: , Rfl:  .  nitrofurantoin (MACRODANTIN) 50 MG  capsule, Take 50 mg by mouth daily as needed (uti symptoms/after sexual intercourse.). , Disp: , Rfl:  .  Turmeric Curcumin 500 MG CAPS, Take 500 mg by mouth daily. , Disp: , Rfl:   Past Medical Problems: Past Medical History:  Diagnosis Date  . Allergy   . Frequent urinary tract infections   . Hypertension     Past Surgical History: Past Surgical History:  Procedure Laterality Date  . BREAST BIOPSY Right 04/05/2020   [place] bx, Q shape marker 9:00, path pending  . BREAST BIOPSY Right 04/05/2020   [place] bx, x shape marker 1:00, path pending  . BREAST RECONSTRUCTION WITH PLACEMENT OF TISSUE EXPANDER AND FLEX HD (ACELLULAR HYDRATED DERMIS) Right 04/25/2020   Procedure: BREAST RECONSTRUCTION WITH PLACEMENT OF TISSUE EXPANDER AND FLEX HD (ACELLULAR HYDRATED DERMIS);  Surgeon: [name], MD;  Location: [facility];  Service: Plastics;  Laterality: Right;  . CHOLECYSTECTOMY    . L4-5 herniated disc repair    . NASAL SINUS SURGERY  2005  . PLANTAR FASCIA RELEASE    . SIMPLE MASTECTOMY WITH AXILLARY SENTINEL NODE BIOPSY Right 04/25/2020   Procedure: SIMPLE MASTECTOMY WITH AXILLARY SENTINEL NODE BIOPSY;  Surgeon: Ronny Bacon, MD;  Location: [facility];  Service: General;  Laterality: Right;    Social History: Social History   Socioeconomic History  . Marital status: Married    Spouse name: Not on file  . Number of children: Not on  file  . Years of education: Not on file  . Highest education level: Not on file  Occupational History  . Not on file  Tobacco Use  . Smoking status: Former Smoker    Quit date: 10/12/2019    Years since quitting: 0.6  . Smokeless tobacco: Never Used  Substance and Sexual Activity  . Alcohol use: Never  . Drug use: Never  . Sexual activity: Not Currently  Other Topics Concern  . Not on file  Social History Narrative   Quit smoking [teen- 1995; quit]; no alcohol. Lives in [place] by self; daughter in [place]. RN- Scientist, research (medical).     Social Determinants of Health   Financial Resource Strain:   . Difficulty of Paying Living Expenses: Not on file  Food Insecurity:   . Worried About Charity fundraiser in the Last Year: Not on file  . Ran Out of Food in the Last Year: Not on file  Transportation Needs:   . Lack of Transportation (Medical): Not on file  . Lack of Transportation (Non-Medical): Not on file  Physical Activity:   . Days of Exercise per Week: Not on file  . Minutes of Exercise per Session: Not on file  Stress:   . Feeling of Stress : Not on file  Social Connections:   . Frequency of Communication with Friends and Family: Not on file  . Frequency of Social Gatherings with Friends and Family: Not on file  . Attends Religious Services: Not on file  . Active Member of Clubs or Organizations: Not on file  . Attends Archivist Meetings: Not on file  . Marital Status: Not on file  Intimate Partner Violence:   . Fear of Current or Ex-Partner: Not on file  . Emotionally Abused: Not on file  . Physically Abused: Not on file  . Sexually Abused: Not on file    Family History: Family History  Problem Relation Age of Onset  . Cancer Mother        non-small cell lung cancer- smoker  . Cancer Father        oral & larynx- smoker  . Alzheimer's disease Maternal Grandmother   . Melanoma Maternal Uncle     Review of Systems: Review of Systems  Constitutional: Negative for chills and fever.  HENT: Negative for congestion and sore throat.   Respiratory: Negative for cough and shortness of breath.   Cardiovascular: Negative for chest pain and palpitations.  Gastrointestinal: Negative for abdominal pain, nausea and vomiting.  Skin: Negative for itching and rash.    Physical Exam: Vital Signs BP (!) 141/80 (BP Location: Left Arm, Patient Position: Sitting, Cuff Size: Large)   Pulse 82   Temp 98.6 F (37 C) (Oral)   Ht 5\' 9"  (1.753 m)   Wt 219 lb 12.8 oz (99.7 kg)   SpO2 99%   BMI 32.46  kg/m  Physical Exam Constitutional:      General: She is not in acute distress.    Appearance: Normal appearance. She is not ill-appearing.  HENT:     Head: Normocephalic and atraumatic.  Eyes:     Extraocular Movements: Extraocular movements intact.  Cardiovascular:     Rate and Rhythm: Normal rate and regular rhythm.     Pulses: Normal pulses.     Heart sounds: Normal heart sounds.  Pulmonary:     Effort: Pulmonary effort is normal.     Breath sounds: Normal breath sounds. No wheezing, rhonchi or rales.  Abdominal:     General: Bowel sounds are normal.     Palpations: Abdomen is soft.  Musculoskeletal:        General: No swelling. Normal range of motion.     Cervical back: Normal range of motion.  Skin:    General: Skin is warm and dry.     Coloration: Skin is not pale.     Findings: No erythema or rash.  Neurological:     General: No focal deficit present.     Mental Status: She is alert and oriented to person, place, and time.  Psychiatric:        Mood and Affect: Mood normal.        Behavior: Behavior normal.        Thought Content: Thought content normal.        Judgment: Judgment normal.     Assessment/Plan:  Ms. Elden scheduled for removal of right breast expander with placement of implant with Dr. Claudia Desanctis.  Risks, benefits, and alternatives of procedure discussed, questions answered and consent obtained.    Smoking Status: non-smoker; Counseling Given? N/A Last Mammogram: 04/15/20; Results: Right breast cancer; Left breast mp mass or abnormal enhancement.  Caprini Score: High; Risk Factors include: 54 year old female, Hx cancer, BMI > 25, and length of planned surgery. Recommendation for mechanical and pharmacological prophylaxis during surgery. Encourage early ambulation.   Pictures obtained: 05/23/2020  Post-op Rx sent to pharmacy: none -patient reports she did not use the Zofran or prescription pain medication after her last surgery and so does not feel  she needs any further surgery.  She will manage again on Tylenol and ibuprofen and call us if something additional as needed.  Patient was provided with the General Surgical Risk consent document and Pain Medication Agreement prior to their appointment.  They had adequate time to read through the risk consent documents and Pain Medication Agreement. We also discussed them in person together during this preop appointment. All of their questions were answered to their satisfaction.  Recommended calling if they have any further questions.  Risk consent form and Pain Medication Agreement to be scanned into patient's chart.  Electronically signed by: Threasa Heads, PA-C 06/01/2020 5:04 PM

## 2020-05-30 ENCOUNTER — Ambulatory Visit: Payer: BC Managed Care – PPO | Admitting: Internal Medicine

## 2020-06-01 ENCOUNTER — Encounter: Payer: Self-pay | Admitting: Plastic Surgery

## 2020-06-01 ENCOUNTER — Other Ambulatory Visit: Payer: Self-pay

## 2020-06-01 ENCOUNTER — Ambulatory Visit (INDEPENDENT_AMBULATORY_CARE_PROVIDER_SITE_OTHER): Payer: BC Managed Care – PPO | Admitting: Plastic Surgery

## 2020-06-01 ENCOUNTER — Encounter (HOSPITAL_BASED_OUTPATIENT_CLINIC_OR_DEPARTMENT_OTHER): Payer: Self-pay | Admitting: Plastic Surgery

## 2020-06-01 VITALS — BP 141/80 | HR 82 | Temp 98.6°F | Ht 69.0 in | Wt 219.8 lb

## 2020-06-01 DIAGNOSIS — Z9011 Acquired absence of right breast and nipple: Secondary | ICD-10-CM

## 2020-06-06 ENCOUNTER — Other Ambulatory Visit: Payer: Self-pay

## 2020-06-06 ENCOUNTER — Ambulatory Visit (INDEPENDENT_AMBULATORY_CARE_PROVIDER_SITE_OTHER): Payer: BC Managed Care – PPO | Admitting: Adult Health

## 2020-06-06 ENCOUNTER — Other Ambulatory Visit (HOSPITAL_COMMUNITY)
Admission: RE | Admit: 2020-06-06 | Discharge: 2020-06-06 | Disposition: A | Discharge status: Home / Self Care | Payer: BC Managed Care – PPO | Source: Ambulatory Visit | Attending: Adult Health | Admitting: Adult Health

## 2020-06-06 ENCOUNTER — Encounter: Payer: Self-pay | Admitting: Adult Health

## 2020-06-06 VITALS — BP 146/79 | HR 75 | Temp 98.2°F | Resp 16 | Wt 227.2 lb

## 2020-06-06 DIAGNOSIS — Z853 Personal history of malignant neoplasm of breast: Secondary | ICD-10-CM

## 2020-06-06 DIAGNOSIS — Z6833 Body mass index (BMI) 33.0-33.9, adult: Secondary | ICD-10-CM

## 2020-06-06 DIAGNOSIS — Z9011 Acquired absence of right breast and nipple: Secondary | ICD-10-CM

## 2020-06-06 DIAGNOSIS — I10 Essential (primary) hypertension: Secondary | ICD-10-CM

## 2020-06-06 DIAGNOSIS — Z124 Encounter for screening for malignant neoplasm of cervix: Secondary | ICD-10-CM

## 2020-06-06 HISTORY — DX: Personal history of malignant neoplasm of breast: Z85.3

## 2020-06-06 MED ORDER — BUPROPION HCL ER (XL) 150 MG PO TB24
150.0000 mg | ORAL_TABLET | Freq: Every day | ORAL | 3 refills | Status: DC
Start: 2020-06-06 — End: 2020-09-21

## 2020-06-06 MED ORDER — FLUTICASONE PROPIONATE 50 MCG/ACT NA SUSP
1.0000 | Freq: Every day | NASAL | 6 refills | Status: DC
Start: 2020-06-06 — End: 2021-10-23

## 2020-06-06 MED ORDER — NITROFURANTOIN MACROCRYSTAL 50 MG PO CAPS: 50.0000 mg | ORAL_CAPSULE | Freq: Every day | ORAL | 1 refills | Status: DC | PRN

## 2020-06-06 MED ORDER — DILTIAZEM HCL ER COATED BEADS 240 MG PO CP24
240.0000 mg | ORAL_CAPSULE | Freq: Every day | ORAL | 2 refills | Status: DC
Start: 2020-06-06 — End: 2021-01-20

## 2020-06-06 NOTE — Patient Instructions (Signed)

## 2020-06-06 NOTE — Progress Notes (Signed)
Established patient visit   Patient: Kirsten James   DOB: January 20, 1966   54 y.o. Female  MRN: [id number] Visit Date: 06/06/2020  Today's healthcare provider: Marcille Buffy, FNP   Chief Complaint  Patient presents with  . Gynecologic Exam   Subjective    HPI  Patient presents in office today for gynecology exam she states that she feels well today. She is status post right mastectomy for estrogen positive breast cancer wil undergo reconstruction in next month.   Denies any vaginal concerns or any abnormal bleeding.  Last pap unknown.   Patient  denies any fever, body aches,chills, rash, chest pain, shortness of breath, nausea, vomiting, or diarrhea. ]  Patient Active Problem List   Diagnosis Date Noted  . History of cancer of right breast 06/06/2020  . Screening for cervical cancer 06/06/2020  . S/P mastectomy, right 05/09/2020  . Carcinoma of overlapping sites of right breast in female, estrogen receptor positive ([place]) 04/20/2020  . Encounter for screening mammogram for malignant neoplasm of breast 09/15/2019  . Body mass index (BMI) of 33.0-33.9 in adult 09/15/2019  . Leukocytes in urine 09/15/2019  . Menopause 09/15/2019  . Essential hypertension 09/15/2019  . Vitamin D deficiency 09/15/2019   Past Medical History:  Diagnosis Date  . Allergy   . Frequent urinary tract infections   . Hypertension    Allergies  Allergen Reactions  . Amoxicillin Hives  . Other     Allergy to pollen and mold- patient reports watery eyes, runny nose and sneezing as reaction  . Penicillins Hives       Medications: Outpatient Medications Prior to Visit  Medication Sig  . Cholecalciferol (VITAMIN D3) 125 MCG (5000 UT) TABS Take 5,000 Units by mouth daily.  Marland Kitchen FIBER PO Take 1 capsule by mouth daily.  . Multiple Vitamin (MULTIVITAMIN WITH MINERALS) TABS tablet Take 1 tablet by mouth daily.  . Turmeric Curcumin 500 MG CAPS Take 500 mg by mouth daily.   .  [DISCONTINUED] buPROPion (WELLBUTRIN XL) 150 MG 24 hr tablet Take 150 mg by mouth daily.  . [DISCONTINUED] diltiazem (CARTIA XT) 240 MG 24 hr capsule Cartia XT 180 mg capsule,extended release Take 1 capsule every day by oral route. (Patient taking differently: Take 240 mg by mouth daily. )  . [DISCONTINUED] fluticasone (FLONASE) 50 MCG/ACT nasal spray Place 1 spray into both nostrils daily.   . [DISCONTINUED] nitrofurantoin (MACRODANTIN) 50 MG capsule Take 50 mg by mouth daily as needed (uti symptoms/after sexual intercourse.).    No facility-administered medications prior to visit.    Review of Systems  All other systems reviewed and are negative.     Objective    BP (!) 146/79   Pulse 75   Temp 98.2 F (36.8 C) (Oral)   Resp 16   Wt 227 lb 3.2 oz (103.1 kg)   SpO2 100%   BMI 33.55 kg/m    Physical Exam Exam conducted with a chaperone present.  Constitutional:      General: She is not in acute distress.    Appearance: She is not ill-appearing, toxic-appearing or diaphoretic.  HENT:     Right Ear: External ear normal.     Left Ear: External ear normal.     Nose: Nose normal. No congestion or rhinorrhea.     Mouth/Throat:     Mouth: Mucous membranes are moist.  Eyes:     Extraocular Movements: Extraocular movements intact.  Cardiovascular:     Rate and Rhythm:  Normal rate and regular rhythm.     Pulses: Normal pulses.     Heart sounds: Normal heart sounds. No murmur heard.  No friction rub. No gallop.   Pulmonary:     Effort: Pulmonary effort is normal. No respiratory distress.     Breath sounds: Normal breath sounds. No stridor. No wheezing, rhonchi or rales.  Chest:     Chest wall: No tenderness.  Abdominal:     General: There is no distension.     Palpations: Abdomen is soft.     Tenderness: There is no abdominal tenderness.     Hernia: There is no hernia in the left inguinal area or right inguinal area.  Genitourinary:    Exam position: Lithotomy position.      Tanner stage (genital): 5.     Labia:        Right: No rash, tenderness, lesion or injury.        Left: No rash, tenderness, lesion or injury.      Urethra: No prolapse, urethral pain, urethral swelling or urethral lesion.     Vagina: Normal.     Cervix: Erythema (mild at os. ) and cervical bleeding (with cervical curettage mild ) present. No cervical motion tenderness, discharge, friability, lesion or eversion.     Uterus: Normal.      Adnexa: Right adnexa normal.       Right: No mass, tenderness or fullness.         Left: No mass, tenderness or fullness.    Musculoskeletal:        General: Normal range of motion.     Cervical back: Normal range of motion and neck supple.  Lymphadenopathy:     Lower Body: No right inguinal adenopathy.  Skin:    General: Skin is warm.  Neurological:     Mental Status: She is alert.     Motor: No weakness.     Gait: Gait normal.  Psychiatric:        Mood and Affect: Mood normal.        Behavior: Behavior normal.        Thought Content: Thought content normal.        Judgment: Judgment normal.      No results found for any visits on 06/06/20.  Assessment & Plan     Screening for cervical cancer - Plan: Cytology - PAP  S/P mastectomy, right  History of cancer of right breast  Body mass index (BMI) of 33.0-33.9 in adult  Essential hypertension No orders of the defined types were placed in this encounter.  Meds ordered this encounter  Medications  . fluticasone (FLONASE) 50 MCG/ACT nasal spray    Sig: Place 1 spray into both nostrils daily.    Dispense:  16 g    Refill:  6  . buPROPion (WELLBUTRIN XL) 150 MG 24 hr tablet    Sig: Take 1 tablet (150 mg total) by mouth daily.    Dispense:  90 tablet    Refill:  3  . diltiazem (CARTIA XT) 240 MG 24 hr capsule    Sig: Take 1 capsule (240 mg total) by mouth daily.    Dispense:  90 capsule    Refill:  2  . nitrofurantoin (MACRODANTIN) 50 MG capsule    Sig: Take 1 capsule (50 mg  total) by mouth daily as needed (uti symptoms/after sexual intercourse.).    Dispense:  90 capsule    Refill:  1  refills as above given.,  Needs cologuard or colonoscopy.   Pap cytology today. Repeat in 3 years pending results.  Return in about 3 months (around 09/06/2020), or if symptoms worsen or fail to improve, for at any time for any worsening symptoms, Go to Emergency room/ urgent care if worse.      Marcille Buffy, [place] [phone] (phone) [phone] (fax)  [place]

## 2020-06-08 ENCOUNTER — Encounter: Payer: Self-pay | Admitting: Internal Medicine

## 2020-06-09 ENCOUNTER — Other Ambulatory Visit: Payer: Self-pay | Admitting: Internal Medicine

## 2020-06-09 NOTE — Progress Notes (Signed)
FYI- -------------------------------------------------- Hi Ms. Ratchford: I wanted to inform you that your oncotype is back; and shows the risk of breast cancer recurrence is quite low. And, hence chemotherapy is NOT recommended [Good news]. I will discuss the details at next office visit.   Happy Thanksgiving to you and your family,  Dr.B

## 2020-06-10 ENCOUNTER — Other Ambulatory Visit: Payer: Self-pay

## 2020-06-10 ENCOUNTER — Other Ambulatory Visit
Admission: RE | Admit: 2020-06-10 | Discharge: 2020-06-10 | Disposition: A | Discharge status: Home / Self Care | Payer: BC Managed Care – PPO | Source: Ambulatory Visit | Attending: Plastic Surgery | Admitting: Plastic Surgery

## 2020-06-10 DIAGNOSIS — Z01812 Encounter for preprocedural laboratory examination: Secondary | ICD-10-CM | POA: Insufficient documentation

## 2020-06-10 DIAGNOSIS — Z20822 Contact with and (suspected) exposure to covid-19: Secondary | ICD-10-CM | POA: Insufficient documentation

## 2020-06-11 LAB — SARS CORONAVIRUS 2 (TAT 6-24 HRS): SARS Coronavirus 2: NEGATIVE

## 2020-06-14 ENCOUNTER — Other Ambulatory Visit: Payer: Self-pay

## 2020-06-14 ENCOUNTER — Ambulatory Visit (HOSPITAL_BASED_OUTPATIENT_CLINIC_OR_DEPARTMENT_OTHER): Payer: BC Managed Care – PPO | Admitting: Certified Registered"

## 2020-06-14 ENCOUNTER — Encounter (HOSPITAL_BASED_OUTPATIENT_CLINIC_OR_DEPARTMENT_OTHER): Payer: Self-pay | Admitting: Plastic Surgery

## 2020-06-14 ENCOUNTER — Ambulatory Visit (HOSPITAL_BASED_OUTPATIENT_CLINIC_OR_DEPARTMENT_OTHER)
Admission: RE | Admit: 2020-06-14 | Discharge: 2020-06-14 | Disposition: A | Discharge status: Home / Self Care | Payer: BC Managed Care – PPO | Attending: Plastic Surgery | Admitting: Plastic Surgery

## 2020-06-14 ENCOUNTER — Encounter (HOSPITAL_BASED_OUTPATIENT_CLINIC_OR_DEPARTMENT_OTHER)
Admission: RE | Disposition: A | Discharge status: Home / Self Care | Payer: Self-pay | Source: Home / Self Care | Attending: Plastic Surgery

## 2020-06-14 DIAGNOSIS — Z9011 Acquired absence of right breast and nipple: Secondary | ICD-10-CM | POA: Diagnosis not present

## 2020-06-14 DIAGNOSIS — Z801 Family history of malignant neoplasm of trachea, bronchus and lung: Secondary | ICD-10-CM | POA: Insufficient documentation

## 2020-06-14 DIAGNOSIS — C50911 Malignant neoplasm of unspecified site of right female breast: Secondary | ICD-10-CM | POA: Insufficient documentation

## 2020-06-14 DIAGNOSIS — Z880 Allergy status to penicillin: Secondary | ICD-10-CM | POA: Diagnosis not present

## 2020-06-14 DIAGNOSIS — Z79899 Other long term (current) drug therapy: Secondary | ICD-10-CM | POA: Diagnosis not present

## 2020-06-14 DIAGNOSIS — C50912 Malignant neoplasm of unspecified site of left female breast: Secondary | ICD-10-CM

## 2020-06-14 DIAGNOSIS — Z87891 Personal history of nicotine dependence: Secondary | ICD-10-CM | POA: Insufficient documentation

## 2020-06-14 HISTORY — PX: MASTOPEXY: SHX5358

## 2020-06-14 HISTORY — PX: REMOVAL OF TISSUE EXPANDER AND PLACEMENT OF IMPLANT: SHX6457

## 2020-06-14 HISTORY — DX: Other complications of anesthesia, initial encounter: T88.59XA

## 2020-06-14 LAB — CYTOLOGY - PAP: Diagnosis: NEGATIVE

## 2020-06-14 SURGERY — REMOVAL, TISSUE EXPANDER, BREAST, WITH IMPLANT INSERTION
Anesthesia: General | Site: Breast | Laterality: Right

## 2020-06-14 MED ORDER — DEXAMETHASONE SODIUM PHOSPHATE 10 MG/ML IJ SOLN
INTRAMUSCULAR | Status: AC
  Filled 2020-06-14: qty 1

## 2020-06-14 MED ORDER — PROPOFOL 10 MG/ML IV BOLUS
INTRAVENOUS | Status: DC | PRN
  Administered 2020-06-14: 200 mg via INTRAVENOUS

## 2020-06-14 MED ORDER — LIDOCAINE HCL (CARDIAC) PF 100 MG/5ML IV SOSY
PREFILLED_SYRINGE | INTRAVENOUS | Status: DC | PRN
  Administered 2020-06-14: 60 mg via INTRAVENOUS

## 2020-06-14 MED ORDER — ONDANSETRON HCL 4 MG/2ML IJ SOLN
INTRAMUSCULAR | Status: DC | PRN
  Administered 2020-06-14: 4 mg via INTRAVENOUS

## 2020-06-14 MED ORDER — HYDROMORPHONE HCL 1 MG/ML IJ SOLN
INTRAMUSCULAR | Status: AC
  Filled 2020-06-14: qty 0.5

## 2020-06-14 MED ORDER — SODIUM CHLORIDE 0.9 % IV SOLN
INTRAVENOUS | Status: DC | PRN
  Administered 2020-06-14: 500 mL

## 2020-06-14 MED ORDER — LACTATED RINGERS IV SOLN
INTRAVENOUS | Status: DC | PRN
  Administered 2020-06-14: 250 mL

## 2020-06-14 MED ORDER — OXYCODONE HCL 5 MG PO TABS: 5.0000 mg | ORAL_TABLET | Freq: Once | ORAL | Status: DC | PRN

## 2020-06-14 MED ORDER — CLINDAMYCIN PHOSPHATE 900 MG/50ML IV SOLN
900.0000 mg | INTRAVENOUS | Status: AC
  Administered 2020-06-14: 900 mg via INTRAVENOUS

## 2020-06-14 MED ORDER — FENTANYL CITRATE (PF) 100 MCG/2ML IJ SOLN
INTRAMUSCULAR | Status: AC
  Filled 2020-06-14: qty 2

## 2020-06-14 MED ORDER — MIDAZOLAM HCL 5 MG/5ML IJ SOLN
INTRAMUSCULAR | Status: DC | PRN
  Administered 2020-06-14: 2 mg via INTRAVENOUS

## 2020-06-14 MED ORDER — CLINDAMYCIN PHOSPHATE 900 MG/50ML IV SOLN
INTRAVENOUS | Status: AC
  Filled 2020-06-14: qty 50

## 2020-06-14 MED ORDER — ONDANSETRON HCL 4 MG/2ML IJ SOLN
INTRAMUSCULAR | Status: AC
  Filled 2020-06-14: qty 2

## 2020-06-14 MED ORDER — EPINEPHRINE PF 1 MG/ML IJ SOLN
INTRAMUSCULAR | Status: AC
  Filled 2020-06-14: qty 1

## 2020-06-14 MED ORDER — LIDOCAINE 2% (20 MG/ML) 5 ML SYRINGE
INTRAMUSCULAR | Status: AC
  Filled 2020-06-14: qty 5

## 2020-06-14 MED ORDER — FENTANYL CITRATE (PF) 100 MCG/2ML IJ SOLN
INTRAMUSCULAR | Status: DC | PRN
  Administered 2020-06-14: 50 ug via INTRAVENOUS
  Administered 2020-06-14 (×6): 25 ug via INTRAVENOUS

## 2020-06-14 MED ORDER — MIDAZOLAM HCL 2 MG/2ML IJ SOLN
INTRAMUSCULAR | Status: AC
  Filled 2020-06-14: qty 2

## 2020-06-14 MED ORDER — BUPIVACAINE HCL (PF) 0.25 % IJ SOLN
INTRAMUSCULAR | Status: AC
  Filled 2020-06-14: qty 30

## 2020-06-14 MED ORDER — AMISULPRIDE (ANTIEMETIC) 5 MG/2ML IV SOLN: 10.0000 mg | Freq: Once | INTRAVENOUS | Status: DC | PRN

## 2020-06-14 MED ORDER — MEPERIDINE HCL 25 MG/ML IJ SOLN: 6.2500 mg | INTRAMUSCULAR | Status: DC | PRN

## 2020-06-14 MED ORDER — PROMETHAZINE HCL 25 MG/ML IJ SOLN: 6.2500 mg | INTRAMUSCULAR | Status: DC | PRN

## 2020-06-14 MED ORDER — OXYCODONE HCL 5 MG/5ML PO SOLN: 5.0000 mg | Freq: Once | ORAL | Status: DC | PRN

## 2020-06-14 MED ORDER — HYDROMORPHONE HCL 1 MG/ML IJ SOLN
0.2500 mg | INTRAMUSCULAR | Status: DC | PRN
  Administered 2020-06-14 (×2): 0.5 mg via INTRAVENOUS

## 2020-06-14 MED ORDER — DEXAMETHASONE SODIUM PHOSPHATE 10 MG/ML IJ SOLN
INTRAMUSCULAR | Status: DC | PRN
  Administered 2020-06-14: 5 mg via INTRAVENOUS

## 2020-06-14 MED ORDER — LACTATED RINGERS IV SOLN: INTRAVENOUS | Status: DC

## 2020-06-14 SURGICAL SUPPLY — 70 items
BAG DECANTER FOR FLEXI CONT (MISCELLANEOUS) ×3 IMPLANT
BENZOIN TINCTURE PRP APPL 2/3 (GAUZE/BANDAGES/DRESSINGS) ×6 IMPLANT
BLADE SURG 10 STRL SS (BLADE) ×3 IMPLANT
BLADE SURG 15 STRL LF DISP TIS (BLADE) ×4 IMPLANT
BLADE SURG 15 STRL SS (BLADE) ×6
BNDG GAUZE ELAST 4 BULKY (GAUZE/BANDAGES/DRESSINGS) IMPLANT
CANISTER SUCT 1200ML W/VALVE (MISCELLANEOUS) ×3 IMPLANT
CHLORAPREP W/TINT 26 (MISCELLANEOUS) ×6 IMPLANT
COVER BACK TABLE 60X90IN (DRAPES) ×3 IMPLANT
COVER MAYO STAND STRL (DRAPES) ×3 IMPLANT
COVER WAND RF STERILE (DRAPES) IMPLANT
DECANTER SPIKE VIAL GLASS SM (MISCELLANEOUS) IMPLANT
DRAIN CHANNEL 15F RND FF W/TCR (WOUND CARE) IMPLANT
DRAPE LAPAROSCOPIC ABDOMINAL (DRAPES) ×3 IMPLANT
DRAPE UTILITY XL STRL (DRAPES) ×3 IMPLANT
DRSG PAD ABDOMINAL 8X10 ST (GAUZE/BANDAGES/DRESSINGS) ×12 IMPLANT
ELECT BLADE 4.0 EZ CLEAN MEGAD (MISCELLANEOUS) ×3
ELECT COATED BLADE 2.86 ST (ELECTRODE) IMPLANT
ELECT REM PT RETURN 9FT ADLT (ELECTROSURGICAL) ×3
ELECTRODE BLDE 4.0 EZ CLN MEGD (MISCELLANEOUS) ×2 IMPLANT
ELECTRODE REM PT RTRN 9FT ADLT (ELECTROSURGICAL) ×2 IMPLANT
EVACUATOR SILICONE 100CC (DRAIN) IMPLANT
FUNNEL KELLER 2 DISP (MISCELLANEOUS) ×3 IMPLANT
GLOVE BIO SURGEON STRL SZ 6 (GLOVE) IMPLANT
GLOVE BIO SURGEON STRL SZ 6.5 (GLOVE) ×3 IMPLANT
GLOVE BIOGEL M STRL SZ7.5 (GLOVE) ×3 IMPLANT
GLOVE BIOGEL PI IND STRL 8 (GLOVE) ×2 IMPLANT
GLOVE BIOGEL PI INDICATOR 8 (GLOVE) ×1
GLOVE ECLIPSE 6.5 STRL STRAW (GLOVE) ×6 IMPLANT
GOWN STRL REUS W/ TWL LRG LVL3 (GOWN DISPOSABLE) ×4 IMPLANT
GOWN STRL REUS W/ TWL XL LVL3 (GOWN DISPOSABLE) ×2 IMPLANT
GOWN STRL REUS W/TWL LRG LVL3 (GOWN DISPOSABLE) ×6
GOWN STRL REUS W/TWL XL LVL3 (GOWN DISPOSABLE) ×3
IMPL BREAST GEL MP 560CC (Breast) ×2 IMPLANT
IMPLANT BREAST GEL MP 560CC (Breast) ×3 IMPLANT
IV NS 500ML (IV SOLUTION)
IV NS 500ML BAXH (IV SOLUTION) IMPLANT
KIT FILL SYSTEM UNIVERSAL (SET/KITS/TRAYS/PACK) IMPLANT
MARKER SKIN DUAL TIP RULER LAB (MISCELLANEOUS) IMPLANT
NDL SAFETY ECLIPSE 18X1.5 (NEEDLE) ×2 IMPLANT
NEEDLE HYPO 18GX1.5 SHARP (NEEDLE) ×3
NEEDLE HYPO 25X1 1.5 SAFETY (NEEDLE) ×3 IMPLANT
NEEDLE SPNL 18GX3.5 QUINCKE PK (NEEDLE) ×3 IMPLANT
PACK BASIN DAY SURGERY FS (CUSTOM PROCEDURE TRAY) ×3 IMPLANT
PENCIL SMOKE EVACUATOR (MISCELLANEOUS) ×3 IMPLANT
PIN SAFETY STERILE (MISCELLANEOUS) IMPLANT
SIZER BREAST REUSE 560CC (SIZER) ×3
SIZER BRST REUSE P4.8 560CC (SIZER) ×2 IMPLANT
SLEEVE SCD COMPRESS KNEE MED (MISCELLANEOUS) ×3 IMPLANT
SPONGE LAP 18X18 RF (DISPOSABLE) ×6 IMPLANT
STAPLER INSORB 30 2030 C-SECTI (MISCELLANEOUS) ×3 IMPLANT
STAPLER VISISTAT 35W (STAPLE) ×6 IMPLANT
STRIP SUTURE WOUND CLOSURE 1/2 (MISCELLANEOUS) ×6 IMPLANT
SUT ETHILON 2 0 FS 18 (SUTURE) IMPLANT
SUT MNCRL AB 4-0 PS2 18 (SUTURE) ×3 IMPLANT
SUT PDS 3-0 CT2 (SUTURE) ×6
SUT PDS II 3-0 CT2 27 ABS (SUTURE) ×4 IMPLANT
SUT SILK 2 0 SH (SUTURE) IMPLANT
SUT VLOC 180 0 24IN GS25 (SUTURE) ×6 IMPLANT
SUT VLOC 90 P-14 23 (SUTURE) ×3 IMPLANT
SYR 50ML LL SCALE MARK (SYRINGE) ×3 IMPLANT
SYR BULB EAR ULCER 2OZ BL STRL (SYRINGE) ×3 IMPLANT
SYR BULB IRRIG 60ML STRL (SYRINGE) IMPLANT
SYR CONTROL 10ML LL (SYRINGE) ×3 IMPLANT
SYR TB 1ML LL NO SAFETY (SYRINGE) ×3 IMPLANT
TOWEL GREEN STERILE FF (TOWEL DISPOSABLE) ×3 IMPLANT
TUBE CONNECTING 20X1/4 (TUBING) ×3 IMPLANT
TUBING INFILTRATION IT-10001 (TUBING) ×3 IMPLANT
UNDERPAD 30X36 HEAVY ABSORB (UNDERPADS AND DIAPERS) ×6 IMPLANT
YANKAUER SUCT BULB TIP NO VENT (SUCTIONS) ×3 IMPLANT

## 2020-06-14 NOTE — Discharge Instructions (Signed)
Activity As tolerated: NO showers for 3 days No heavy activities  Diet: Regular  Wound Care: Keep dressing clean & dry for 3 days. No showers for 3 days. Keep wrap applied with compression as much as possible 24/7.    Do not change dressings for 3 days unless soiled.  Can change if needed but make sure to reapply wrap. After three days can remove wrap and shower.  Then reapply dressings if needed and continue compression with wrap or soft sports bra 24/7 for 6 weeks.  Call doctor if any unusual problems occur such as pain, excessive bleeding, unrelieved nausea/vomiting, fever &/or chills  Follow-up appointment: Scheduled for Dec 8.   Post Anesthesia Home Care Instructions  Activity: Get plenty of rest for the remainder of the day. A responsible individual must stay with you for 24 hours following the procedure.  For the next 24 hours, DO NOT: -Drive a car -Paediatric nurse -Drink alcoholic beverages -Take any medication unless instructed by your physician -Make any legal decisions or sign important papers.  Meals: Start with liquid foods such as gelatin or soup. Progress to regular foods as tolerated. Avoid greasy, spicy, heavy foods. If nausea and/or vomiting occur, drink only clear liquids until the nausea and/or vomiting subsides. Call your physician if vomiting continues.  Special Instructions/Symptoms: Your throat may feel dry or sore from the anesthesia or the breathing tube placed in your throat during surgery. If this causes discomfort, gargle with warm salt water. The discomfort should disappear within 24 hours.  If you had a scopolamine patch placed behind your ear for the management of post- operative nausea and/or vomiting:  1. The medication in the patch is effective for 72 hours, after which it should be removed.  Wrap patch in a tissue and discard in the trash. Wash hands thoroughly with soap and water. 2. You may remove the patch earlier than 72 hours if you  experience unpleasant side effects which may include dry mouth, dizziness or visual disturbances. 3. Avoid touching the patch. Wash your hands with soap and water after contact with the patch.

## 2020-06-14 NOTE — Brief Op Note (Signed)
06/14/2020  12:12 PM  PATIENT:  Kirsten James  54 y.o. female  PRE-OPERATIVE DIAGNOSIS:  post right breast mastectomy, breast asymmetry  POST-OPERATIVE DIAGNOSIS:  post right breast mastectomy, breast asymmetry  PROCEDURE:  Procedure(s): REMOVAL OF TISSUE EXPANDER AND PLACEMENT OF IMPLANT (Right) LEFT BREAST MASTOPEXY (Left)  SURGEON:  Surgeon(s) and Role:    * Shameca Landen, Steffanie Dunn, MD - Primary  PHYSICIAN ASSISTANT: Phoebe Sharps, PA  ASSISTANTS: none   ANESTHESIA:   general  EBL:  20 mL   BLOOD ADMINISTERED:none  DRAINS: none   LOCAL MEDICATIONS USED:  MARCAINE     SPECIMEN:  Source of Specimen:  left breast tissue  DISPOSITION OF SPECIMEN:  PATHOLOGY  COUNTS:  YES  TOURNIQUET:  * No tourniquets in log *  DICTATION: .Dragon Dictation  PLAN OF CARE: Discharge to home after PACU  PATIENT DISPOSITION:  PACU - hemodynamically stable.   Delay start of Pharmacological VTE agent (>24hrs) due to surgical blood loss or risk of bleeding: not applicable

## 2020-06-14 NOTE — Progress Notes (Signed)
PAP smear within normal limits. Repeat every three years unless clinically indicated sooner.

## 2020-06-14 NOTE — Interval H&P Note (Signed)
History and Physical Interval Note:  06/14/2020 10:04 AM  Kirsten James  has presented today for surgery, with the diagnosis of post right breast mastectomy, breast asymmetry.  The various methods of treatment have been discussed with the patient and family. After consideration of risks, benefits and other options for treatment, the patient has consented to  Procedure(s): REMOVAL OF TISSUE EXPANDER AND PLACEMENT OF IMPLANT (Right) LEFT BREAST MASTOPEXY (Left) as a surgical intervention.  The patient's history has been reviewed, patient examined, no change in status, stable for surgery.  I have reviewed the patient's chart and labs.  Questions were answered to the patient's satisfaction.     Kirsten James

## 2020-06-14 NOTE — Op Note (Signed)
Operative Note   DATE OF OPERATION: 06/14/2020  SURGICAL DEPARTMENT: Plastic Surgery  PREOPERATIVE DIAGNOSES: Right breast cancer  POSTOPERATIVE DIAGNOSES:  same  PROCEDURE: 1.  Exchange of right breast tissue expander for silicone gel implant 2.  Right breast lateral capsulorrhaphy 3.  Left mastopexy for symmetry  SURGEON: Talmadge Coventry, MD  ASSISTANT: Phoebe Sharps, PA The advanced practice practitioner (APP) assisted throughout the case.  The APP was essential in retraction and counter traction when needed to make the case progress smoothly.  This retraction and assistance made it possible to see the tissue plans for the procedure.  The assistance was needed for blood control, tissue re-approximation and assisted with closure of the incision site.  ANESTHESIA:  General.   COMPLICATIONS: None.   INDICATIONS FOR PROCEDURE:  The patient, Kirsten James is a 54 y.o. female born on [birth year], is here for treatment of right breast reconstruction.  She is more ptotic on the left side and therefore desires a mastopexy for symmetry. MRN: [id number]  CONSENT:  Informed consent was obtained directly from the patient. Risks, benefits and alternatives were fully discussed. Specific risks including but not limited to bleeding, infection, hematoma, seroma, scarring, pain, contracture, asymmetry, wound healing problems, and need for further surgery were all discussed. The patient did have an ample opportunity to have questions answered to satisfaction.   DESCRIPTION OF PROCEDURE:  The patient was taken to the operating room. SCDs were placed and antibiotics were given.  General anesthesia was administered.  The patient's operative site was prepped and draped in a sterile fashion. A time out was performed and all information was confirmed to be correct.  Started making an incision along the previous scar on the right side.  Dissected down to the pocket with cautery.  Fluid was removed  from the expander and it was taken out without any issue.  A 560 cc sizer was placed and look to be the appropriate size.  I did decide to do a lateral capsulorrhaphy with 0 V-Loc to medialize the implant.  The skin was then stapled closed and I turned my attention to the left side.  She was a bit bigger on the left side and more ptotic.  I planned a Wise pattern reduction with superomedial pedicle.  This is been marked out preoperatively with her in the standing position.  The nipple was marked with a 38 mm cookie cutter.  I drew out a superomedial pedicle with a greater than 8 cm base.  A small amount of tumescent was infiltrated on the left side to help with hemostasis and pain.  I then de-epithelialized the pedicle with a 15 blade.  The remainder the incisions were then made with a knife isolated the pedicle down to the near aspect of the chest wall.  The excision was performed with cautery.  The skin was stapled closed.  Patient was set up to check for shape and symmetry and minor modifications were made.  Total volume removed from the left side was 290 g.  The left side was then closed along the inframammary limb with buried Enzor staples.  The vertical limb was closed with buried 3-0 PDS sutures.  The periareolar limb was closed with 4-0 Monocryl sutures.  A 3 OV lock was then run along the entirety of the incision.  On the right side the sizer was removed and the pocket was irrigated copiously with triple antibiotic solution.  The implant was then brought onto the field.  It was a  Mentor smooth round moderate plus profile extra implant.  Had a 560 cc volume.  The serial number was [id number].  This was then placed into the pocket with a Keller funnel.  Skin was closed with interrupted buried 3-0 PDS sutures and a running 3 OV lock.  Steri-Strips were then applied throughout followed by a soft compressive dressing.  The patient tolerated the procedure well.  There were no complications. The patient was  allowed to wake from anesthesia, extubated and taken to the recovery room in satisfactory condition.

## 2020-06-14 NOTE — Transfer of Care (Signed)
Immediate Anesthesia Transfer of Care Note  Patient: Kirsten James  Procedure(s) Performed: REMOVAL OF TISSUE EXPANDER AND PLACEMENT OF IMPLANT (Right Breast) LEFT BREAST MASTOPEXY (Left Breast)  Patient Location: PACU  Anesthesia Type:General  Level of Consciousness: awake, alert  and oriented  Airway & Oxygen Therapy: Patient Spontanous Breathing and Patient connected to face mask oxygen  Post-op Assessment: Report given to RN and Post -op Vital signs reviewed and stable  Post vital signs: Reviewed and stable  Last Vitals:  Vitals Value Taken Time  BP 136/86 06/14/20 1224  Temp    Pulse 85 06/14/20 1228  Resp 16 06/14/20 1228  SpO2 98 % 06/14/20 1228  Vitals shown include unvalidated device data.  Last Pain:  Vitals:   06/14/20 0928  TempSrc: Oral  PainSc: 0-No pain         Complications: No complications documented.

## 2020-06-14 NOTE — Anesthesia Preprocedure Evaluation (Signed)
Anesthesia Evaluation  Patient identified by MRN, date of birth, ID band Patient awake    Reviewed: Allergy & Precautions, H&P , NPO status , Patient's Chart, lab work & pertinent test results, reviewed documented beta blocker date and time   Airway Mallampati: II  TM Distance: >3 FB Neck ROM: full    Dental  (+) Teeth Intact   Pulmonary neg pulmonary ROS, former smoker,    Pulmonary exam normal        Cardiovascular Exercise Tolerance: Good hypertension, On Medications negative cardio ROS Normal cardiovascular exam Rhythm:regular Rate:Normal     Neuro/Psych negative neurological ROS  negative psych ROS   GI/Hepatic negative GI ROS, Neg liver ROS,   Endo/Other  negative endocrine ROS  Renal/GU negative Renal ROS  negative genitourinary   Musculoskeletal   Abdominal (+) + obese,   Peds  Hematology negative hematology ROS (+)   Anesthesia Other Findings Past Medical History: No date: Allergy No date: Frequent urinary tract infections No date: Hypertension Past Surgical History: 04/05/2020: BREAST BIOPSY; Right     Comment:  [place] bx, Q shape marker 9:00, path pending 04/05/2020: BREAST BIOPSY; Right     Comment:  [place] bx, x shape marker 1:00, path pending No date: CHOLECYSTECTOMY No date: L4-5 herniated disc repair 2005: NASAL SINUS SURGERY No date: PLANTAR FASCIA RELEASE BMI    Body Mass Index: 31.75 kg/m     Reproductive/Obstetrics negative OB ROS                             Anesthesia Physical  Anesthesia Plan  ASA: II  Anesthesia Plan: General   Post-op Pain Management:    Induction: Intravenous  PONV Risk Score and Plan: 3 and Ondansetron, Dexamethasone, Midazolam and Treatment may vary due to age or medical condition  Airway Management Planned: LMA  Additional Equipment:   Intra-op Plan:   Post-operative Plan: Extubation in OR  Informed Consent: I have reviewed  the patients History and Physical, chart, labs and discussed the procedure including the risks, benefits and alternatives for the proposed anesthesia with the patient or authorized representative who has indicated his/her understanding and acceptance.     Dental Advisory Given  Plan Discussed with: CRNA  Anesthesia Plan Comments:         Anesthesia Quick Evaluation

## 2020-06-14 NOTE — Anesthesia Procedure Notes (Addendum)
Procedure Name: LMA Insertion Date/Time: 06/14/2020 10:17 AM Performed by: Lavonia Dana, CRNA Pre-anesthesia Checklist: Patient identified, Emergency Drugs available, Suction available and Patient being monitored Patient Re-evaluated:Patient Re-evaluated prior to induction Oxygen Delivery Method: Circle system utilized Preoxygenation: Pre-oxygenation with 100% oxygen Induction Type: IV induction Ventilation: Mask ventilation without difficulty LMA: LMA inserted LMA Size: 4.0 Number of attempts: 1 Airway Equipment and Method: Bite block Placement Confirmation: positive ETCO2 Tube secured with: Tape Dental Injury: Teeth and Oropharynx as per pre-operative assessment

## 2020-06-14 NOTE — Anesthesia Postprocedure Evaluation (Signed)
Anesthesia Post Note  Patient: Kirsten James  Procedure(s) Performed: REMOVAL OF TISSUE EXPANDER AND PLACEMENT OF IMPLANT (Right Breast) LEFT BREAST MASTOPEXY (Left Breast)     Patient location during evaluation: PACU Anesthesia Type: General Level of consciousness: awake and alert Pain management: pain level controlled Vital Signs Assessment: post-procedure vital signs reviewed and stable Respiratory status: spontaneous breathing, nonlabored ventilation and respiratory function stable Cardiovascular status: blood pressure returned to baseline and stable Postop Assessment: no apparent nausea or vomiting Anesthetic complications: no   No complications documented.  Last Vitals:  Vitals:   06/14/20 1300 06/14/20 1312  BP: 134/81 130/74  Pulse: 76 76  Resp: 16 16  Temp:  36.8 C  SpO2: 95% 97%    Last Pain:  Vitals:   06/14/20 1312  TempSrc: Oral  PainSc: 0-No pain                 Lynda Rainwater

## 2020-06-15 ENCOUNTER — Encounter (HOSPITAL_BASED_OUTPATIENT_CLINIC_OR_DEPARTMENT_OTHER): Payer: Self-pay | Admitting: Plastic Surgery

## 2020-06-15 LAB — SURGICAL PATHOLOGY

## 2020-06-16 ENCOUNTER — Other Ambulatory Visit: Payer: Self-pay

## 2020-06-16 ENCOUNTER — Ambulatory Visit: Payer: BC Managed Care – PPO | Admitting: Dermatology

## 2020-06-16 DIAGNOSIS — D225 Melanocytic nevi of trunk: Secondary | ICD-10-CM | POA: Diagnosis not present

## 2020-06-16 DIAGNOSIS — Z853 Personal history of malignant neoplasm of breast: Secondary | ICD-10-CM

## 2020-06-16 DIAGNOSIS — L821 Other seborrheic keratosis: Secondary | ICD-10-CM | POA: Diagnosis not present

## 2020-06-16 DIAGNOSIS — L820 Inflamed seborrheic keratosis: Secondary | ICD-10-CM

## 2020-06-16 DIAGNOSIS — D1800 Hemangioma unspecified site: Secondary | ICD-10-CM

## 2020-06-16 DIAGNOSIS — D492 Neoplasm of unspecified behavior of bone, soft tissue, and skin: Secondary | ICD-10-CM

## 2020-06-16 DIAGNOSIS — L578 Other skin changes due to chronic exposure to nonionizing radiation: Secondary | ICD-10-CM

## 2020-06-16 DIAGNOSIS — L814 Other melanin hyperpigmentation: Secondary | ICD-10-CM | POA: Diagnosis not present

## 2020-06-16 DIAGNOSIS — D229 Melanocytic nevi, unspecified: Secondary | ICD-10-CM

## 2020-06-16 DIAGNOSIS — Z1283 Encounter for screening for malignant neoplasm of skin: Secondary | ICD-10-CM

## 2020-06-16 DIAGNOSIS — L738 Other specified follicular disorders: Secondary | ICD-10-CM | POA: Diagnosis not present

## 2020-06-16 DIAGNOSIS — D239 Other benign neoplasm of skin, unspecified: Secondary | ICD-10-CM

## 2020-06-16 HISTORY — DX: Other benign neoplasm of skin, unspecified: D23.9

## 2020-06-16 NOTE — Progress Notes (Signed)
New Patient Visit  Subjective  Kirsten James is a 54 y.o. female who presents for the following: Annual Exam (Total body skin exam, no hx of skin ca, FHx of Melanoma Maternal Uncle, Fhx of SCC mother) and check spots (arms). The patient presents for Total-Body Skin Exam (TBSE) for skin cancer screening and mole check.  New patient referral from Laverna Peace, [place].  The following portions of the chart were reviewed this encounter and updated as appropriate:  Tobacco  Allergies  Meds  Problems  Med Hx  Surg Hx  Fam Hx      Review of Systems:  No other skin or systemic complaints except as noted in HPI or Assessment and Plan.  Objective  Well appearing patient in no apparent distress; mood and affect are within normal limits.  A full examination was performed including scalp, head, eyes, ears, nose, lips, neck, chest, axillae, abdomen, back, buttocks, bilateral upper extremities, bilateral lower extremities, hands, feet, fingers, toes, fingernails, and toenails. All findings within normal limits unless otherwise noted below  Objective  bil arms/chest x 9 (9): Erythematous keratotic or waxy stuck-on papule or plaque.   Objective  R medial cheek/lat nose x 1: Yellow lobulated pap  Images    Objective  Left buttocks: Irregular brown macule 0.8cm   Assessment & Plan    Lentigines - Scattered tan macules - Discussed due to sun exposure - Benign, observe - Call for any changes  Seborrheic Keratoses - Stuck-on, waxy, tan-brown papules and plaques  - Discussed benign etiology and prognosis. - Observe - Call for any changes  Melanocytic Nevi - Tan-brown and/or pink-flesh-colored symmetric macules and papules - Benign appearing on exam today - Observation - Call clinic for new or changing moles - Recommend daily use of broad spectrum spf 30+ sunscreen to sun-exposed areas.   Hemangiomas - Red papules - Discussed benign nature - Observe - Call for  any changes  Actinic Damage - Chronic, secondary to cumulative UV/sun exposure - diffuse scaly erythematous macules with underlying dyspigmentation - Recommend daily broad spectrum sunscreen SPF 30+ to sun-exposed areas, reapply every 2 hours as needed.  - Call for new or changing lesions.  Skin cancer screening performed today.  Hx of Breast Ca - Recent breast surgery  Inflamed seborrheic keratosis (9) bil arms/chest x 9  Destruction of lesion - bil arms/chest x 9 Complexity: simple   Destruction method: cryotherapy   Informed consent: discussed and consent obtained   Timeout:  patient name, date of birth, surgical site, and procedure verified Lesion destroyed using liquid nitrogen: Yes   Region frozen until ice ball extended beyond lesion: Yes   Outcome: patient tolerated procedure well with no complications   Post-procedure details: wound care instructions given    Sebaceous hyperplasia R medial cheek/lat nose x 1  Benign Discussed ED $60 for treatment  Destruction of lesion - R medial cheek/lat nose x 1 Complexity: simple   Destruction method comment:  Electrodesiccation Informed consent: discussed and consent obtained   Timeout:  patient name, date of birth, surgical site, and procedure verified Patient was prepped and draped in usual sterile fashion: patient was prepped with isopropyl alcohol. Outcome: patient tolerated procedure well with no complications    Neoplasm of skin Left buttocks  Epidermal / dermal shaving  Lesion diameter (cm):  0.8 Informed consent: discussed and consent obtained   Timeout: patient name, date of birth, surgical site, and procedure verified   Procedure prep:  Patient was prepped and draped  in usual sterile fashion Prep type:  Isopropyl alcohol Anesthesia: the lesion was anesthetized in a standard fashion   Anesthetic:  1% lidocaine w/ epinephrine 1-100,000 buffered w/ 8.4% NaHCO3 Instrument used: flexible razor blade   Hemostasis  achieved with: pressure, aluminum chloride and electrodesiccation   Outcome: patient tolerated procedure well   Post-procedure details: sterile dressing applied and wound care instructions given   Dressing type: bandage and petrolatum    Specimen 1 - Surgical pathology Differential Diagnosis: D48.5 Nevus vs Dysplastic Nevus Check Margins: yes Irregular brown macule 0.8cm  Skin cancer screening  Return for Will schedule f/u pending bx results.   I, Othelia Pulling, RMA, am acting as scribe for Sarina Ser, MD .  Documentation: I have reviewed the above documentation for accuracy and completeness, and I agree with the above.  Sarina Ser, MD

## 2020-06-20 ENCOUNTER — Telehealth: Payer: Self-pay

## 2020-06-20 NOTE — Telephone Encounter (Signed)
Patient informed of pathology results and next year's appointment scheduled.

## 2020-06-20 NOTE — Telephone Encounter (Signed)
-----   Message from Ralene Bathe, MD sent at 06/20/2020 11:46 AM EST ----- Diagnosis Skin , left buttocks DYSPLASTIC JUNCTIONAL LENTIGINOUS NEVUS WITH MODERATE ATYPIA  Dysplastic Moderate Recheck 1 year - make 1 year appt

## 2020-06-21 ENCOUNTER — Inpatient Hospital Stay: Payer: BC Managed Care – PPO | Attending: Internal Medicine | Admitting: Internal Medicine

## 2020-06-21 DIAGNOSIS — R232 Flushing: Secondary | ICD-10-CM | POA: Insufficient documentation

## 2020-06-21 DIAGNOSIS — Z79899 Other long term (current) drug therapy: Secondary | ICD-10-CM | POA: Insufficient documentation

## 2020-06-21 DIAGNOSIS — M810 Age-related osteoporosis without current pathological fracture: Secondary | ICD-10-CM | POA: Diagnosis not present

## 2020-06-21 DIAGNOSIS — C50211 Malignant neoplasm of upper-inner quadrant of right female breast: Secondary | ICD-10-CM | POA: Diagnosis not present

## 2020-06-21 DIAGNOSIS — Z780 Asymptomatic menopausal state: Secondary | ICD-10-CM | POA: Diagnosis not present

## 2020-06-21 DIAGNOSIS — C50811 Malignant neoplasm of overlapping sites of right female breast: Secondary | ICD-10-CM | POA: Diagnosis not present

## 2020-06-21 DIAGNOSIS — Z170 Estrogen receptor positive status [ER+]: Secondary | ICD-10-CM | POA: Diagnosis not present

## 2020-06-21 MED ORDER — LETROZOLE 2.5 MG PO TABS: 2.5000 mg | ORAL_TABLET | Freq: Every day | ORAL | 3 refills | Status: DC

## 2020-06-21 NOTE — Assessment & Plan Note (Addendum)
#  Right breast-invasive mammary carcinoma [2 foci-9:00-7 mm; 1:00-13 millimeter-ONCOTYPE- 11; risk of recurrence- 3% with endocrine with endocrine therapy alone.  No significant benefit from adjuvant chemotherapy.  Would not recommend adjuvant chemotherapy.  # #Discussed the mechanism of action of aromatase inhibitors-with blocking of estrogen to prevent breast cancer.  Also discussed the potential side effects including but not limited to arthralgias hot flashes and increased risk of osteoporosis.  Recommend bone density at baseline.  Continue calcium plus vitamin D.  Recommend letrozole.  New prescription sent.  Patient will be due for a mammogram in August 2022.  # Hot flashes-postmenopausal; currently off celexa [sleepy]; on wellbutrin.  Monitor closely for worsening of hot flashes.  #Postmenopausal status: Reviewed the blood work with PCP in March 2021-FSH [facility] in postmenopausal levels.  However no estradiol levels noted.  Will check postmenopausal status.  # DISPOSITION: # labs- estradiol/ LH/FSH this week # follow up in 3 months; MD; no labs; BMD prior- Dr.B

## 2020-06-21 NOTE — Progress Notes (Signed)
I connected with Kirsten James on 06/21/20 at  2:30 PM EST by video enabled telemedicine visit and verified that I am speaking with the correct person using two identifiers.  I discussed the limitations, risks, security and privacy concerns of performing an evaluation and management service by telemedicine and the availability of in-person appointments. I also discussed with the patient that there may be a patient responsible charge related to this service. The patient expressed understanding and agreed to proceed.    Other persons participating in the visit and their role in the encounter: RN/medical reconciliation Patient's location:home Provider's location: office  Oncology History Overview Note  # Right breast upper outer- [facility]; ER/PR-POSITIVE; Her- 2 NEG; G-2; s/p Lumpectomy- mpT Category: pT1c [42mm-1'O & 15mm-9'O pN0; NEGATIVE MARGINS; ONCOTYPE on 85mm- RS 11; risk of recurrence at 9 years- 3%. No benefit from chemo.   # DEC 2021- START FEMARA.     # # SURVIVORSHIP:   # GENETICS:   DIAGNOSIS:   STAGE:   I      ;  GOALS: cure  CURRENT/MOST RECENT THERAPY :     Malignant neoplasm of upper-inner quadrant of right female breast ([place]) (Resolved)  04/12/2020 Initial Diagnosis   Malignant neoplasm of upper-inner quadrant of right female breast ([place])   Carcinoma of overlapping sites of right breast in female, estrogen receptor positive ([place])  04/20/2020 Initial Diagnosis   Carcinoma of overlapping sites of right breast in female, estrogen receptor positive ([place])      Chief Complaint: Breast cancer   History of present illness:Kirsten James 54 y.o.  female with history of breast cancer newly diagnosed is here to review the results of her Oncotype testing.  Patient denies any new lumps or bumps.  Denies any pain.  Appetite is fair.  Observation/objective: None.  Assessment and plan: Carcinoma of overlapping sites of right breast in female, estrogen receptor  positive ([place]) #Right breast-invasive mammary carcinoma [2 foci-9:00-7 mm; 1:00-13 millimeter-ONCOTYPE- 11; risk of recurrence- 3% with endocrine with endocrine therapy alone.  No significant benefit from adjuvant chemotherapy.  Would not recommend adjuvant chemotherapy.  # #Discussed the mechanism of action of aromatase inhibitors-with blocking of estrogen to prevent breast cancer.  Also discussed the potential side effects including but not limited to arthralgias hot flashes and increased risk of osteoporosis.  Recommend bone density at baseline.  Continue calcium plus vitamin D.  Recommend letrozole.  New prescription sent.  Patient will be due for a mammogram in August 2022.  # Hot flashes-postmenopausal; currently off celexa [sleepy]; on wellbutrin.  Monitor closely for worsening of hot flashes.  #Postmenopausal status: Reviewed the blood work with PCP in March 2021-FSH [facility] in postmenopausal levels.  However no estradiol levels noted.  Will check postmenopausal status.  # DISPOSITION: # labs- estradiol/ LH/FSH this week # follow up in 3 months; MD; no labs; BMD prior- Dr.B   Follow-up instructions:  I discussed the assessment and treatment plan with the patient.  The patient was provided an opportunity to ask questions and all were answered.  The patient agreed with the plan and demonstrated understanding of instructions.  The patient was advised to call back or seek an in person evaluation if the symptoms worsen or if the condition fails to improve as anticipated.  Dr. Charlaine Dalton [facility] at [facility] 06/21/2020 4:44 PM

## 2020-06-22 ENCOUNTER — Ambulatory Visit (INDEPENDENT_AMBULATORY_CARE_PROVIDER_SITE_OTHER): Payer: BC Managed Care – PPO | Admitting: Plastic Surgery

## 2020-06-22 ENCOUNTER — Encounter: Payer: Self-pay | Admitting: Plastic Surgery

## 2020-06-22 ENCOUNTER — Encounter: Payer: Self-pay | Admitting: Dermatology

## 2020-06-22 ENCOUNTER — Other Ambulatory Visit: Payer: Self-pay

## 2020-06-22 VITALS — BP 149/76 | HR 91 | Temp 99.0°F

## 2020-06-22 DIAGNOSIS — Z9011 Acquired absence of right breast and nipple: Secondary | ICD-10-CM

## 2020-06-22 NOTE — Progress Notes (Signed)
Patient is here a little over a week postop from exchange of right breast tissue expander for permanent implant combined with left side reduction/mastopexy.  She overall feels like she is healing fine.  She is bothered by the medial aspect of the incision on the reconstructive side being somewhat pointy and not a smooth contour.  She is very happy with reduction side.  On examination all of her incisions look to be healing fine.  Nipple areolar complex on the left side is viable.  All the incisions on the reduction side are healing nicely.  She does have a bit of a dogear in the medial aspect on the right reconstructed side.  Its not to prominent and I expect it will improve a bit with time.  Position of the implant on the right side is fairly symmetric with the breast mound on the left side.  I explained I would like to see her back in a few weeks but she says she is very busy working in does not think she will be able to make another appointment in the next few weeks timeframe.  I did offer for her to call with any issues or send [place] pictures of her MyChart messages if she has some questions.  Otherwise I will plan to see her in 3 months to evaluate the dogear and assess for any other small modifications that need to be made according to her preferences.  All her questions were answered.  I did stress continue to wear supportive compressive garment and avoiding strenuous activity.

## 2020-06-23 ENCOUNTER — Inpatient Hospital Stay: Payer: BC Managed Care – PPO

## 2020-07-13 ENCOUNTER — Other Ambulatory Visit: Payer: Self-pay

## 2020-07-13 ENCOUNTER — Encounter: Payer: Self-pay | Admitting: Plastic Surgery

## 2020-07-13 ENCOUNTER — Ambulatory Visit (INDEPENDENT_AMBULATORY_CARE_PROVIDER_SITE_OTHER): Payer: BC Managed Care – PPO | Admitting: Plastic Surgery

## 2020-07-13 VITALS — HR 86 | Temp 98.6°F

## 2020-07-13 DIAGNOSIS — C50811 Malignant neoplasm of overlapping sites of right female breast: Secondary | ICD-10-CM

## 2020-07-13 DIAGNOSIS — Z170 Estrogen receptor positive status [ER+]: Secondary | ICD-10-CM

## 2020-07-13 NOTE — Progress Notes (Signed)
Patient presents several weeks out from exchange of right tissue expander for implant combined with left mastopexy.  She is concerned about an area of opening in the T-junction on the left side.  On exam there is a 3 to 4 mm opening with no surrounding erythema.  I cannot palpate any subcutaneous suture or staple.  I expect this wound to heal up in a few weeks time with Vaseline or Neosporin and a small bandage to keep the drainage from getting on her close.  She still bothered by the medial dogear on the right side and feels that there is been a little bit of descent of the implant on the right side and I think she will ultimately benefit from a fat grafting procedure on that side to fill out the upper pole.  We will plan to see her back in a few months or sooner if she has any other concerns.

## 2020-09-06 ENCOUNTER — Telehealth: Payer: Self-pay | Admitting: *Deleted

## 2020-09-06 NOTE — Telephone Encounter (Signed)
Message received from scheduling that patient wanted to cnl all her apts with Dr. Jacinto Reap. She also cnl the bone density test at [facility].   Per Dr. Jacinto Reap - if she chooses not to follow up with me, that is fine. But she needs follow up of her breast cancer/ and also somebody needs to refill her Aromatase inhibitor.

## 2020-09-06 NOTE — Telephone Encounter (Signed)
Spoke to patient. She declines all future follow-ups in the cancer center. She declines to take Aromatase inhibitors or "any breast cancer pill" and prefers to follow-up with her local pcp for her breast cancer care. Dr. Rogue Bussing made aware

## 2020-09-07 NOTE — Telephone Encounter (Signed)
Would advise patient to keep follow up with cancer center and  for recommended treatment.  Schedule follow up if she would like to discuss with PCP.

## 2020-09-08 ENCOUNTER — Encounter: Payer: Self-pay | Admitting: Adult Health

## 2020-09-08 NOTE — Telephone Encounter (Signed)
Spoke with patient and advised her of message below, she states that she had a masectomy and lymph nodes were negative, she states that she dosent feel treatment is necessary at this point and she states that she will follow up with you PRN. KW

## 2020-09-08 NOTE — Telephone Encounter (Signed)
Noted  

## 2020-09-14 ENCOUNTER — Other Ambulatory Visit: Payer: BC Managed Care – PPO

## 2020-09-20 ENCOUNTER — Other Ambulatory Visit: Payer: BC Managed Care – PPO

## 2020-09-20 ENCOUNTER — Ambulatory Visit: Payer: BC Managed Care – PPO | Admitting: Internal Medicine

## 2020-09-21 ENCOUNTER — Other Ambulatory Visit: Payer: Self-pay

## 2020-09-21 ENCOUNTER — Ambulatory Visit (INDEPENDENT_AMBULATORY_CARE_PROVIDER_SITE_OTHER): Payer: BC Managed Care – PPO | Admitting: Plastic Surgery

## 2020-09-21 ENCOUNTER — Encounter: Payer: Self-pay | Admitting: Plastic Surgery

## 2020-09-21 VITALS — BP 171/84 | HR 78

## 2020-09-21 DIAGNOSIS — Z170 Estrogen receptor positive status [ER+]: Secondary | ICD-10-CM

## 2020-09-21 DIAGNOSIS — C50811 Malignant neoplasm of overlapping sites of right female breast: Secondary | ICD-10-CM

## 2020-09-21 NOTE — Progress Notes (Signed)
Patient presents about 3 months out from right breast reconstruction and a contralateral lift/reduction.  She is overall very happy with the shape size and symmetry.  She is little bit bothered by dogear medially on the right side and the abrupt transition between the implant in her chest for the right prepectoral reconstruction.  On examination I agree with what she is pointing out and we discussed fat grafting to the upper pole combined with a dogear excision medially.  I think this would address her concerns and improve her overall result which is already pretty good.  She is interested in moving forward and would like to do this as soon as possible.  We discussed the risk of the procedure that include bleeding, infection, damage to surrounding structures and need for additional procedures.  All of her questions were answered and we will plan to move forward.

## 2020-10-04 ENCOUNTER — Encounter: Payer: Self-pay | Admitting: *Deleted

## 2020-11-04 ENCOUNTER — Ambulatory Visit (INDEPENDENT_AMBULATORY_CARE_PROVIDER_SITE_OTHER): Payer: BC Managed Care – PPO | Admitting: Surgical

## 2020-11-04 ENCOUNTER — Encounter: Payer: Self-pay | Admitting: Surgical

## 2020-11-04 ENCOUNTER — Other Ambulatory Visit: Payer: Self-pay

## 2020-11-04 VITALS — BP 166/92 | HR 74 | Ht 69.0 in | Wt 219.6 lb

## 2020-11-04 DIAGNOSIS — C50811 Malignant neoplasm of overlapping sites of right female breast: Secondary | ICD-10-CM

## 2020-11-04 DIAGNOSIS — Z9011 Acquired absence of right breast and nipple: Secondary | ICD-10-CM

## 2020-11-04 DIAGNOSIS — Z170 Estrogen receptor positive status [ER+]: Secondary | ICD-10-CM

## 2020-11-04 MED ORDER — DOXYCYCLINE HYCLATE 100 MG PO TABS: 100.0000 mg | ORAL_TABLET | Freq: Two times a day (BID) | ORAL | 0 refills | Status: AC

## 2020-11-04 NOTE — H&P (View-Only) (Signed)
Patient ID: Kirsten James, female    DOB: May 28, 1966, 55 y.o.   MRN: [id number]  Chief Complaint  Patient presents with  . Pre-op Exam      ICD-10-CM   1. Carcinoma of overlapping sites of right breast in female, estrogen receptor positive ([place])  C50.811    Z17.0   2. S/P mastectomy, right  Z90.11      History of Present Illness: Kirsten James is a 55 y.o.  female  with a history of right breast reconstruction and contralateral lift/reduction.  She presents for preoperative evaluation for upcoming procedure, right breast fat grafting and excess skin excision, scheduled for 11/15/2020 with Dr. Claudia Desanctis.  The patient has not had problems with anesthesia. No history of DVT/PE.  No family history of DVT/PE.  No family or personal history of bleeding or clotting disorders.  Patient is not currently taking any blood thinners.  No history of CVA/MI.   Job: Insurance work  [place] Significant for: Right breast cancer, hypertension  Patient reports she has been overall feeling well lately, has some allergy symptoms but is otherwise doing well.  No fevers, chills, nausea, vomiting, chest pain, shortness of breath.   Past Medical History: Allergies: Allergies  Allergen Reactions  . Other     Allergy to pollen and mold- patient reports watery eyes, runny nose and sneezing as reaction  . Penicillins Hives  . Amoxicillin Hives    Current Medications:  Current Outpatient Medications:  .  Cholecalciferol (VITAMIN D3) 125 MCG (5000 UT) TABS, Take 5,000 Units by mouth daily., Disp: , Rfl:  .  diltiazem (CARTIA XT) 240 MG 24 hr capsule, Take 1 capsule (240 mg total) by mouth daily., Disp: 90 capsule, Rfl: 2 .  Fexofenadine-Pseudoephedrine (ALLEGRA-D 24 HOUR PO), Take 180 mg by mouth as needed., Disp: , Rfl:  .  FIBER PO, Take 1 capsule by mouth daily., Disp: , Rfl:  .  fluticasone (FLONASE) 50 MCG/ACT nasal spray, Place 1 spray into both nostrils daily., Disp: 16 g, Rfl: 6 .  Multiple  Vitamin (MULTIVITAMIN WITH MINERALS) TABS tablet, Take 1 tablet by mouth daily., Disp: , Rfl:  .  nitrofurantoin (MACRODANTIN) 50 MG capsule, Take 1 capsule (50 mg total) by mouth daily as needed (uti symptoms/after sexual intercourse.)., Disp: 90 capsule, Rfl: 1 .  Turmeric Curcumin 500 MG CAPS, Take 500 mg by mouth daily. , Disp: , Rfl:   Past Medical Problems: Past Medical History:  Diagnosis Date  . Allergy   . Complication of anesthesia    hiccups   . Dysplastic nevus 06/16/2020   L buttocks  . Frequent urinary tract infections   . Hypertension     Past Surgical History: Past Surgical History:  Procedure Laterality Date  . BREAST BIOPSY Right 04/05/2020   [place] bx, Q shape marker 9:00, path pending  . BREAST BIOPSY Right 04/05/2020   [place] bx, x shape marker 1:00, path pending  . BREAST RECONSTRUCTION WITH PLACEMENT OF TISSUE EXPANDER AND FLEX HD (ACELLULAR HYDRATED DERMIS) Right 04/25/2020   Procedure: BREAST RECONSTRUCTION WITH PLACEMENT OF TISSUE EXPANDER AND FLEX HD (ACELLULAR HYDRATED DERMIS);  Surgeon: [name], MD;  Location: [facility];  Service: Plastics;  Laterality: Right;  . CHOLECYSTECTOMY    . L4-5 herniated disc repair    . MASTOPEXY Left 06/14/2020   Procedure: LEFT BREAST MASTOPEXY;  Surgeon: [name], MD;  Location: [facility];  Service: Plastics;  Laterality: Left;  . NASAL SINUS SURGERY  2005  . PLANTAR FASCIA RELEASE    . REMOVAL OF TISSUE EXPANDER AND PLACEMENT OF IMPLANT Right 06/14/2020   Procedure: REMOVAL OF TISSUE EXPANDER AND PLACEMENT OF IMPLANT;  Surgeon: [name], MD;  Location: [facility];  Service: Plastics;  Laterality: Right;  . SIMPLE MASTECTOMY WITH AXILLARY SENTINEL NODE BIOPSY Right 04/25/2020   Procedure: SIMPLE MASTECTOMY WITH AXILLARY SENTINEL NODE BIOPSY;  Surgeon: Ronny Bacon, MD;  Location: [facility];  Service: General;  Laterality: Right;    Social History: Social History    Socioeconomic History  . Marital status: Married    Spouse name: Not on file  . Number of children: Not on file  . Years of education: Not on file  . Highest education level: Not on file  Occupational History  . Not on file  Tobacco Use  . Smoking status: Former Smoker    Quit date: 10/12/2019    Years since quitting: 1.0  . Smokeless tobacco: Never Used  Substance and Sexual Activity  . Alcohol use: Never  . Drug use: Never  . Sexual activity: Not Currently  Other Topics Concern  . Not on file  Social History Narrative   Quit smoking [teen- 1995; quit]; no alcohol. Lives in [place] by self; daughter in [place]. RN- Scientist, research (medical).    Social Determinants of Health   Financial Resource Strain: Not on file  Food Insecurity: Not on file  Transportation Needs: Not on file  Physical Activity: Not on file  Stress: Not on file  Social Connections: Not on file  Intimate Partner Violence: Not on file    Family History: Family History  Problem Relation Age of Onset  . Cancer Mother        non-small cell lung cancer- smoker  . Cancer Father        oral & larynx- smoker  . Alzheimer's disease Maternal Grandmother   . Melanoma Maternal Uncle     Review of Systems: Review of Systems  Constitutional: Negative.   Respiratory: Negative.   Cardiovascular: Negative.   Gastrointestinal: Negative.   Neurological: Negative.     Physical Exam: Vital Signs BP (!) 166/92 (BP Location: Left Arm, Patient Position: Sitting, Cuff Size: Large)   Pulse 74   Ht 5\' 9"  (1.753 m)   Wt 219 lb 9.6 oz (99.6 kg)   SpO2 99%   BMI 32.43 kg/m   Physical Exam Constitutional:      General: Not in acute distress.    Appearance: Normal appearance. Not ill-appearing.  HENT:     Head: Normocephalic and atraumatic.  Eyes:     Pupils: Pupils are equal, round Neck:     Musculoskeletal: Normal range of motion.  Cardiovascular:     Rate and Rhythm: Normal rate    Pulses:  Normal pulses.  Pulmonary:     Effort: Pulmonary effort is normal. No respiratory distress.  Musculoskeletal: Normal range of motion.  Skin:    General: Skin is warm and dry.     Findings: No erythema or rash.  Neurological:     General: No focal deficit present.     Mental Status: Alert and oriented to person, place, and time. Mental status is at baseline.     Motor: No weakness.  Psychiatric:        Mood and Affect: Mood normal.        Behavior: Behavior normal.    Assessment/Plan: The patient is scheduled for fat grafting to right breast and excision  of excess skin with Dr. Claudia Desanctis.  Risks, benefits, and alternatives of procedure discussed, questions answered and consent obtained.    Smoking Status: Non-smoker; Counseling Given?  N/A Last Mammogram: 04/15/2020; Results: Status post right breast mastectomy followed by reconstruction.  No mass or abnormal enhancement of the left breast  Caprini Score: 4, moderate; Risk Factors include: Age, BMI greater than 25, and length of planned surgery. Recommendation for mechanical and pharmacological prophylaxis. Encourage early ambulation.  Based on patient's overall mobility and the planned surgery do not feel as if pharmacological prophylaxis will be necessary.  Pictures obtained: 09/21/2020  Post-op Rx sent to pharmacy: Doxycycline.  Patient reports she has some Norco leftover from her previous surgery and does not need any additional.  Recommend holding multivitamin, vitamin D and turmeric 1 week prior to surgery.  She can restart 24 hours postoperatively.  Patient was provided with the General Surgical Risk consent document and Pain Medication Agreement prior to their appointment.  They had adequate time to read through the risk consent documents and Pain Medication Agreement. We also discussed them in person together during this preop appointment. All of their questions were answered to their satisfaction.  Recommended calling if they have any  further questions.  Risk consent form and Pain Medication Agreement to be scanned into patient's chart.  The risks that can be encountered with and after liposuction were discussed and include the following but no limited to these:  Asymmetry, fluid accumulation, firmness of the area, fat necrosis with death of fat tissue, bleeding, infection, delayed healing, anesthesia risks, skin sensation changes, injury to structures including nerves, blood vessels, and muscles which may be temporary or permanent, allergies to tape, suture materials and glues, blood products, topical preparations or injected agents, skin and contour irregularities, skin discoloration and swelling, deep vein thrombosis, cardiac and pulmonary complications, pain, which may persist, persistent pain, recurrence of the lesion, poor healing of the incision, possible need for revisional surgery or staged procedures. Thiere can also be persistent swelling, poor wound healing, rippling or loose skin, worsening of cellulite, swelling, and thermal burn or heat injury from ultrasound with the ultrasound-assisted lipoplasty technique. Any change in weight fluctuations can alter the outcome.  Patient is understanding that fat grafting is not guaranteed, some of the fat or all of the fat grafted may not take and she may need additional procedures for improvement in the contour.   Electronically signed by: Carola Rhine Atalya Dano, PA-C 11/04/2020 10:52 AM

## 2020-11-04 NOTE — Progress Notes (Signed)
Patient ID: Kirsten James, female    DOB: May 28, 1966, 55 y.o.   MRN: [id number]  Chief Complaint  Patient presents with  . Pre-op Exam      ICD-10-CM   1. Carcinoma of overlapping sites of right breast in female, estrogen receptor positive ([place])  C50.811    Z17.0   2. S/P mastectomy, right  Z90.11      History of Present Illness: Kirsten James is a 55 y.o.  female  with a history of right breast reconstruction and contralateral lift/reduction.  She presents for preoperative evaluation for upcoming procedure, right breast fat grafting and excess skin excision, scheduled for 11/15/2020 with Dr. Claudia Desanctis.  The patient has not had problems with anesthesia. No history of DVT/PE.  No family history of DVT/PE.  No family or personal history of bleeding or clotting disorders.  Patient is not currently taking any blood thinners.  No history of CVA/MI.   Job: Insurance work  [place] Significant for: Right breast cancer, hypertension  Patient reports she has been overall feeling well lately, has some allergy symptoms but is otherwise doing well.  No fevers, chills, nausea, vomiting, chest pain, shortness of breath.   Past Medical History: Allergies: Allergies  Allergen Reactions  . Other     Allergy to pollen and mold- patient reports watery eyes, runny nose and sneezing as reaction  . Penicillins Hives  . Amoxicillin Hives    Current Medications:  Current Outpatient Medications:  .  Cholecalciferol (VITAMIN D3) 125 MCG (5000 UT) TABS, Take 5,000 Units by mouth daily., Disp: , Rfl:  .  diltiazem (CARTIA XT) 240 MG 24 hr capsule, Take 1 capsule (240 mg total) by mouth daily., Disp: 90 capsule, Rfl: 2 .  Fexofenadine-Pseudoephedrine (ALLEGRA-D 24 HOUR PO), Take 180 mg by mouth as needed., Disp: , Rfl:  .  FIBER PO, Take 1 capsule by mouth daily., Disp: , Rfl:  .  fluticasone (FLONASE) 50 MCG/ACT nasal spray, Place 1 spray into both nostrils daily., Disp: 16 g, Rfl: 6 .  Multiple  Vitamin (MULTIVITAMIN WITH MINERALS) TABS tablet, Take 1 tablet by mouth daily., Disp: , Rfl:  .  nitrofurantoin (MACRODANTIN) 50 MG capsule, Take 1 capsule (50 mg total) by mouth daily as needed (uti symptoms/after sexual intercourse.)., Disp: 90 capsule, Rfl: 1 .  Turmeric Curcumin 500 MG CAPS, Take 500 mg by mouth daily. , Disp: , Rfl:   Past Medical Problems: Past Medical History:  Diagnosis Date  . Allergy   . Complication of anesthesia    hiccups   . Dysplastic nevus 06/16/2020   L buttocks  . Frequent urinary tract infections   . Hypertension     Past Surgical History: Past Surgical History:  Procedure Laterality Date  . BREAST BIOPSY Right 04/05/2020   [place] bx, Q shape marker 9:00, path pending  . BREAST BIOPSY Right 04/05/2020   [place] bx, x shape marker 1:00, path pending  . BREAST RECONSTRUCTION WITH PLACEMENT OF TISSUE EXPANDER AND FLEX HD (ACELLULAR HYDRATED DERMIS) Right 04/25/2020   Procedure: BREAST RECONSTRUCTION WITH PLACEMENT OF TISSUE EXPANDER AND FLEX HD (ACELLULAR HYDRATED DERMIS);  Surgeon: [name], MD;  Location: [facility];  Service: Plastics;  Laterality: Right;  . CHOLECYSTECTOMY    . L4-5 herniated disc repair    . MASTOPEXY Left 06/14/2020   Procedure: LEFT BREAST MASTOPEXY;  Surgeon: [name], MD;  Location: [facility];  Service: Plastics;  Laterality: Left;  . NASAL SINUS SURGERY  2005  . PLANTAR FASCIA RELEASE    . REMOVAL OF TISSUE EXPANDER AND PLACEMENT OF IMPLANT Right 06/14/2020   Procedure: REMOVAL OF TISSUE EXPANDER AND PLACEMENT OF IMPLANT;  Surgeon: [name], MD;  Location: [place];  Service: Plastics;  Laterality: Right;  . SIMPLE MASTECTOMY WITH AXILLARY SENTINEL NODE BIOPSY Right 04/25/2020   Procedure: SIMPLE MASTECTOMY WITH AXILLARY SENTINEL NODE BIOPSY;  Surgeon: Ronny Bacon, MD;  Location: [facility];  Service: General;  Laterality: Right;    Social History: Social History    Socioeconomic History  . Marital status: Married    Spouse name: Not on file  . Number of children: Not on file  . Years of education: Not on file  . Highest education level: Not on file  Occupational History  . Not on file  Tobacco Use  . Smoking status: Former Smoker    Quit date: 10/12/2019    Years since quitting: 1.0  . Smokeless tobacco: Never Used  Substance and Sexual Activity  . Alcohol use: Never  . Drug use: Never  . Sexual activity: Not Currently  Other Topics Concern  . Not on file  Social History Narrative   Quit smoking [teen- 1995; quit]; no alcohol. Lives in [place] by self; daughter in [place]. RN- Scientist, research (medical).    Social Determinants of Health   Financial Resource Strain: Not on file  Food Insecurity: Not on file  Transportation Needs: Not on file  Physical Activity: Not on file  Stress: Not on file  Social Connections: Not on file  Intimate Partner Violence: Not on file    Family History: Family History  Problem Relation Age of Onset  . Cancer Mother        non-small cell lung cancer- smoker  . Cancer Father        oral & larynx- smoker  . Alzheimer's disease Maternal Grandmother   . Melanoma Maternal Uncle     Review of Systems: Review of Systems  Constitutional: Negative.   Respiratory: Negative.   Cardiovascular: Negative.   Gastrointestinal: Negative.   Neurological: Negative.     Physical Exam: Vital Signs BP (!) 166/92 (BP Location: Left Arm, Patient Position: Sitting, Cuff Size: Large)   Pulse 74   Ht 5\' 9"  (1.753 m)   Wt 219 lb 9.6 oz (99.6 kg)   SpO2 99%   BMI 32.43 kg/m   Physical Exam Constitutional:      General: Not in acute distress.    Appearance: Normal appearance. Not ill-appearing.  HENT:     Head: Normocephalic and atraumatic.  Eyes:     Pupils: Pupils are equal, round Neck:     Musculoskeletal: Normal range of motion.  Cardiovascular:     Rate and Rhythm: Normal rate    Pulses:  Normal pulses.  Pulmonary:     Effort: Pulmonary effort is normal. No respiratory distress.  Musculoskeletal: Normal range of motion.  Skin:    General: Skin is warm and dry.     Findings: No erythema or rash.  Neurological:     General: No focal deficit present.     Mental Status: Alert and oriented to person, place, and time. Mental status is at baseline.     Motor: No weakness.  Psychiatric:        Mood and Affect: Mood normal.        Behavior: Behavior normal.    Assessment/Plan: The patient is scheduled for fat grafting to right breast and excision  of excess skin with Dr. Claudia Desanctis.  Risks, benefits, and alternatives of procedure discussed, questions answered and consent obtained.    Smoking Status: Non-smoker; Counseling Given?  N/A Last Mammogram: 04/15/2020; Results: Status post right breast mastectomy followed by reconstruction.  No mass or abnormal enhancement of the left breast  Caprini Score: 4, moderate; Risk Factors include: Age, BMI greater than 25, and length of planned surgery. Recommendation for mechanical and pharmacological prophylaxis. Encourage early ambulation.  Based on patient's overall mobility and the planned surgery do not feel as if pharmacological prophylaxis will be necessary.  Pictures obtained: 09/21/2020  Post-op Rx sent to pharmacy: Doxycycline.  Patient reports she has some Norco leftover from her previous surgery and does not need any additional.  Recommend holding multivitamin, vitamin D and turmeric 1 week prior to surgery.  She can restart 24 hours postoperatively.  Patient was provided with the General Surgical Risk consent document and Pain Medication Agreement prior to their appointment.  They had adequate time to read through the risk consent documents and Pain Medication Agreement. We also discussed them in person together during this preop appointment. All of their questions were answered to their satisfaction.  Recommended calling if they have any  further questions.  Risk consent form and Pain Medication Agreement to be scanned into patient's chart.  The risks that can be encountered with and after liposuction were discussed and include the following but no limited to these:  Asymmetry, fluid accumulation, firmness of the area, fat necrosis with death of fat tissue, bleeding, infection, delayed healing, anesthesia risks, skin sensation changes, injury to structures including nerves, blood vessels, and muscles which may be temporary or permanent, allergies to tape, suture materials and glues, blood products, topical preparations or injected agents, skin and contour irregularities, skin discoloration and swelling, deep vein thrombosis, cardiac and pulmonary complications, pain, which may persist, persistent pain, recurrence of the lesion, poor healing of the incision, possible need for revisional surgery or staged procedures. Thiere can also be persistent swelling, poor wound healing, rippling or loose skin, worsening of cellulite, swelling, and thermal burn or heat injury from ultrasound with the ultrasound-assisted lipoplasty technique. Any change in weight fluctuations can alter the outcome.  Patient is understanding that fat grafting is not guaranteed, some of the fat or all of the fat grafted may not take and she may need additional procedures for improvement in the contour.   Electronically signed by: Carola Rhine Bao Coreas, PA-C 11/04/2020 10:52 AM

## 2020-11-07 ENCOUNTER — Other Ambulatory Visit: Payer: Self-pay

## 2020-11-07 ENCOUNTER — Encounter (HOSPITAL_BASED_OUTPATIENT_CLINIC_OR_DEPARTMENT_OTHER): Payer: Self-pay | Admitting: Plastic Surgery

## 2020-11-11 ENCOUNTER — Other Ambulatory Visit
Admission: RE | Admit: 2020-11-11 | Discharge: 2020-11-11 | Disposition: A | Discharge status: Home / Self Care | Payer: BC Managed Care – PPO | Source: Ambulatory Visit | Attending: Plastic Surgery | Admitting: Plastic Surgery

## 2020-11-11 ENCOUNTER — Other Ambulatory Visit (HOSPITAL_COMMUNITY): Payer: BC Managed Care – PPO

## 2020-11-11 ENCOUNTER — Other Ambulatory Visit: Payer: Self-pay

## 2020-11-11 DIAGNOSIS — Z20822 Contact with and (suspected) exposure to covid-19: Secondary | ICD-10-CM | POA: Insufficient documentation

## 2020-11-11 DIAGNOSIS — Z01812 Encounter for preprocedural laboratory examination: Secondary | ICD-10-CM | POA: Insufficient documentation

## 2020-11-11 LAB — SARS CORONAVIRUS 2 (TAT 6-24 HRS): SARS Coronavirus 2: NEGATIVE

## 2020-11-14 NOTE — Anesthesia Preprocedure Evaluation (Addendum)
Anesthesia Evaluation  Patient identified by MRN, date of birth, ID band Patient awake    Reviewed: Allergy & Precautions, NPO status , Patient's Chart, lab work & pertinent test results  History of Anesthesia Complications (+) history of anesthetic complications (hiccups)  Airway Mallampati: II  TM Distance: >3 FB Neck ROM: Full    Dental no notable dental hx. (+) Dental Advisory Given, Teeth Intact   Pulmonary former smoker,  Quit smoking 09/2019   Pulmonary exam normal breath sounds clear to auscultation       Cardiovascular hypertension, Pt. on medications Normal cardiovascular exam Rhythm:Regular Rate:Normal     Neuro/Psych negative neurological ROS  negative psych ROS   GI/Hepatic negative GI ROS, Neg liver ROS,   Endo/Other  negative endocrine ROS  Renal/GU negative Renal ROS  negative genitourinary   Musculoskeletal negative musculoskeletal ROS (+)   Abdominal   Peds  Hematology negative hematology ROS (+)   Anesthesia Other Findings Breast ca s/p mastectomy, carcinoma overlapping sites of right breast, ER+  Reproductive/Obstetrics negative OB ROS                            Anesthesia Physical Anesthesia Plan  ASA: II  Anesthesia Plan: General   Post-op Pain Management:    Induction: Intravenous  PONV Risk Score and Plan: 4 or greater and Dexamethasone, Ondansetron, Midazolam, Scopolamine patch - Pre-op and Treatment may vary due to age or medical condition  Airway Management Planned: LMA  Additional Equipment: None  Intra-op Plan:   Post-operative Plan: Extubation in OR  Informed Consent: I have reviewed the patients History and Physical, chart, labs and discussed the procedure including the risks, benefits and alternatives for the proposed anesthesia with the patient or authorized representative who has indicated his/her understanding and acceptance.     Dental  advisory given  Plan Discussed with: CRNA  Anesthesia Plan Comments:       Anesthesia Quick Evaluation

## 2020-11-15 ENCOUNTER — Other Ambulatory Visit: Payer: Self-pay

## 2020-11-15 ENCOUNTER — Ambulatory Visit (HOSPITAL_BASED_OUTPATIENT_CLINIC_OR_DEPARTMENT_OTHER)
Admission: RE | Admit: 2020-11-15 | Discharge: 2020-11-15 | Disposition: A | Discharge status: Home / Self Care | Payer: BC Managed Care – PPO | Attending: Plastic Surgery | Admitting: Plastic Surgery

## 2020-11-15 ENCOUNTER — Ambulatory Visit (HOSPITAL_BASED_OUTPATIENT_CLINIC_OR_DEPARTMENT_OTHER): Payer: BC Managed Care – PPO | Admitting: Anesthesiology

## 2020-11-15 ENCOUNTER — Encounter (HOSPITAL_BASED_OUTPATIENT_CLINIC_OR_DEPARTMENT_OTHER): Payer: Self-pay | Admitting: Plastic Surgery

## 2020-11-15 ENCOUNTER — Encounter (HOSPITAL_BASED_OUTPATIENT_CLINIC_OR_DEPARTMENT_OTHER)
Admission: RE | Disposition: A | Discharge status: Home / Self Care | Payer: Self-pay | Source: Home / Self Care | Attending: Plastic Surgery

## 2020-11-15 DIAGNOSIS — Z880 Allergy status to penicillin: Secondary | ICD-10-CM | POA: Diagnosis not present

## 2020-11-15 DIAGNOSIS — Z9049 Acquired absence of other specified parts of digestive tract: Secondary | ICD-10-CM | POA: Diagnosis not present

## 2020-11-15 DIAGNOSIS — Z801 Family history of malignant neoplasm of trachea, bronchus and lung: Secondary | ICD-10-CM | POA: Insufficient documentation

## 2020-11-15 DIAGNOSIS — L720 Epidermal cyst: Secondary | ICD-10-CM | POA: Insufficient documentation

## 2020-11-15 DIAGNOSIS — Z9011 Acquired absence of right breast and nipple: Secondary | ICD-10-CM | POA: Insufficient documentation

## 2020-11-15 DIAGNOSIS — Z87891 Personal history of nicotine dependence: Secondary | ICD-10-CM | POA: Diagnosis not present

## 2020-11-15 DIAGNOSIS — Z808 Family history of malignant neoplasm of other organs or systems: Secondary | ICD-10-CM | POA: Diagnosis not present

## 2020-11-15 DIAGNOSIS — I10 Essential (primary) hypertension: Secondary | ICD-10-CM | POA: Insufficient documentation

## 2020-11-15 DIAGNOSIS — Z170 Estrogen receptor positive status [ER+]: Secondary | ICD-10-CM

## 2020-11-15 DIAGNOSIS — Z87440 Personal history of urinary (tract) infections: Secondary | ICD-10-CM | POA: Diagnosis not present

## 2020-11-15 DIAGNOSIS — N650 Deformity of reconstructed breast: Secondary | ICD-10-CM | POA: Insufficient documentation

## 2020-11-15 DIAGNOSIS — C50811 Malignant neoplasm of overlapping sites of right female breast: Secondary | ICD-10-CM

## 2020-11-15 HISTORY — PX: LIPOSUCTION WITH LIPOFILLING: SHX6436

## 2020-11-15 SURGERY — LIPOSUCTION, WITH FAT TRANSFER
Anesthesia: General | Site: Breast | Laterality: Right

## 2020-11-15 MED ORDER — OXYCODONE HCL 5 MG/5ML PO SOLN: 5.0000 mg | Freq: Once | ORAL | Status: AC | PRN

## 2020-11-15 MED ORDER — FENTANYL CITRATE (PF) 100 MCG/2ML IJ SOLN
INTRAMUSCULAR | Status: AC
  Filled 2020-11-15: qty 2

## 2020-11-15 MED ORDER — CLINDAMYCIN PHOSPHATE 900 MG/50ML IV SOLN
INTRAVENOUS | Status: AC
  Filled 2020-11-15: qty 50

## 2020-11-15 MED ORDER — FENTANYL CITRATE (PF) 100 MCG/2ML IJ SOLN
INTRAMUSCULAR | Status: DC | PRN
  Administered 2020-11-15 (×3): 25 ug via INTRAVENOUS
  Administered 2020-11-15: 100 ug via INTRAVENOUS
  Administered 2020-11-15: 25 ug via INTRAVENOUS

## 2020-11-15 MED ORDER — DEXAMETHASONE SODIUM PHOSPHATE 10 MG/ML IJ SOLN
INTRAMUSCULAR | Status: AC
  Filled 2020-11-15: qty 1

## 2020-11-15 MED ORDER — LACTATED RINGERS IV SOLN: INTRAVENOUS | Status: DC

## 2020-11-15 MED ORDER — OXYCODONE HCL 5 MG PO TABS
ORAL_TABLET | ORAL | Status: AC
  Filled 2020-11-15: qty 1

## 2020-11-15 MED ORDER — DROPERIDOL 2.5 MG/ML IJ SOLN
INTRAMUSCULAR | Status: DC | PRN
  Administered 2020-11-15: .625 mg via INTRAVENOUS

## 2020-11-15 MED ORDER — HYDROMORPHONE HCL 1 MG/ML IJ SOLN
INTRAMUSCULAR | Status: AC
  Filled 2020-11-15: qty 0.5

## 2020-11-15 MED ORDER — PROMETHAZINE HCL 25 MG/ML IJ SOLN: 6.2500 mg | INTRAMUSCULAR | Status: DC | PRN

## 2020-11-15 MED ORDER — ACETAMINOPHEN 500 MG PO TABS
ORAL_TABLET | ORAL | Status: AC
  Filled 2020-11-15: qty 2

## 2020-11-15 MED ORDER — MIDAZOLAM HCL 2 MG/2ML IJ SOLN
INTRAMUSCULAR | Status: AC
  Filled 2020-11-15: qty 2

## 2020-11-15 MED ORDER — PHENYLEPHRINE 40 MCG/ML (10ML) SYRINGE FOR IV PUSH (FOR BLOOD PRESSURE SUPPORT)
PREFILLED_SYRINGE | INTRAVENOUS | Status: AC
  Filled 2020-11-15: qty 10

## 2020-11-15 MED ORDER — SUCCINYLCHOLINE CHLORIDE 200 MG/10ML IV SOSY
PREFILLED_SYRINGE | INTRAVENOUS | Status: AC
  Filled 2020-11-15: qty 10

## 2020-11-15 MED ORDER — SCOPOLAMINE 1 MG/3DAYS TD PT72
MEDICATED_PATCH | TRANSDERMAL | Status: AC
  Filled 2020-11-15: qty 1

## 2020-11-15 MED ORDER — LACTATED RINGERS IV SOLN
INTRAVENOUS | Status: DC | PRN
  Administered 2020-11-15: 1000 mL

## 2020-11-15 MED ORDER — OXYCODONE HCL 5 MG PO TABS
5.0000 mg | ORAL_TABLET | Freq: Once | ORAL | Status: AC | PRN
  Administered 2020-11-15: 5 mg via ORAL

## 2020-11-15 MED ORDER — HYDROMORPHONE HCL 1 MG/ML IJ SOLN
0.2500 mg | INTRAMUSCULAR | Status: DC | PRN
  Administered 2020-11-15 (×2): 0.5 mg via INTRAVENOUS

## 2020-11-15 MED ORDER — CLINDAMYCIN PHOSPHATE 900 MG/50ML IV SOLN
900.0000 mg | INTRAVENOUS | Status: AC
  Administered 2020-11-15: 900 mg via INTRAVENOUS

## 2020-11-15 MED ORDER — ACETAMINOPHEN 500 MG PO TABS
1000.0000 mg | ORAL_TABLET | Freq: Once | ORAL | Status: AC
  Administered 2020-11-15: 1000 mg via ORAL

## 2020-11-15 MED ORDER — PROPOFOL 10 MG/ML IV BOLUS
INTRAVENOUS | Status: DC | PRN
  Administered 2020-11-15: 200 mg via INTRAVENOUS

## 2020-11-15 MED ORDER — ONDANSETRON HCL 4 MG/2ML IJ SOLN
INTRAMUSCULAR | Status: DC | PRN
  Administered 2020-11-15: 4 mg via INTRAVENOUS

## 2020-11-15 MED ORDER — MEPERIDINE HCL 25 MG/ML IJ SOLN: 6.2500 mg | INTRAMUSCULAR | Status: DC | PRN

## 2020-11-15 MED ORDER — PROPOFOL 500 MG/50ML IV EMUL
INTRAVENOUS | Status: AC
  Filled 2020-11-15: qty 50

## 2020-11-15 MED ORDER — MIDAZOLAM HCL 5 MG/5ML IJ SOLN
INTRAMUSCULAR | Status: DC | PRN
  Administered 2020-11-15: 2 mg via INTRAVENOUS

## 2020-11-15 MED ORDER — PHENYLEPHRINE HCL (PRESSORS) 10 MG/ML IV SOLN
INTRAVENOUS | Status: DC | PRN
  Administered 2020-11-15: 80 ug via INTRAVENOUS
  Administered 2020-11-15 (×3): 120 ug via INTRAVENOUS
  Administered 2020-11-15: 80 ug via INTRAVENOUS

## 2020-11-15 MED ORDER — EPHEDRINE 5 MG/ML INJ
INTRAVENOUS | Status: AC
  Filled 2020-11-15: qty 10

## 2020-11-15 MED ORDER — LIDOCAINE HCL (CARDIAC) PF 100 MG/5ML IV SOSY
PREFILLED_SYRINGE | INTRAVENOUS | Status: DC | PRN
  Administered 2020-11-15: 60 mg via INTRAVENOUS

## 2020-11-15 MED ORDER — LIDOCAINE 2% (20 MG/ML) 5 ML SYRINGE
INTRAMUSCULAR | Status: AC
  Filled 2020-11-15: qty 5

## 2020-11-15 MED ORDER — DEXAMETHASONE SODIUM PHOSPHATE 4 MG/ML IJ SOLN
INTRAMUSCULAR | Status: DC | PRN
  Administered 2020-11-15: 10 mg via INTRAVENOUS

## 2020-11-15 MED ORDER — ROCURONIUM BROMIDE 10 MG/ML (PF) SYRINGE
PREFILLED_SYRINGE | INTRAVENOUS | Status: AC
  Filled 2020-11-15: qty 10

## 2020-11-15 MED ORDER — SCOPOLAMINE 1 MG/3DAYS TD PT72
1.0000 | MEDICATED_PATCH | TRANSDERMAL | Status: DC
  Administered 2020-11-15: 1.5 mg via TRANSDERMAL

## 2020-11-15 MED ORDER — ONDANSETRON HCL 4 MG/2ML IJ SOLN
INTRAMUSCULAR | Status: AC
  Filled 2020-11-15: qty 2

## 2020-11-15 SURGICAL SUPPLY — 77 items
ADH SKN CLS APL DERMABOND .7 (GAUZE/BANDAGES/DRESSINGS)
APL PRP STRL LF DISP 70% ISPRP (MISCELLANEOUS) ×2
APL SKNCLS STERI-STRIP NONHPOA (GAUZE/BANDAGES/DRESSINGS) ×3
BENZOIN TINCTURE PRP APPL 2/3 (GAUZE/BANDAGES/DRESSINGS) ×6 IMPLANT
BINDER ABDOMINAL 10 UNV 27-48 (MISCELLANEOUS) IMPLANT
BINDER ABDOMINAL 12 ML 46-62 (SOFTGOODS) ×2 IMPLANT
BINDER ABDOMINAL 12 SM 30-45 (SOFTGOODS) IMPLANT
BLADE SURG 10 STRL SS (BLADE) IMPLANT
BLADE SURG 11 STRL SS (BLADE) ×2 IMPLANT
BLADE SURG 15 STRL LF DISP TIS (BLADE) ×1 IMPLANT
BLADE SURG 15 STRL SS (BLADE) ×2
BNDG ELASTIC 6X5.8 VLCR STR LF (GAUZE/BANDAGES/DRESSINGS) ×2 IMPLANT
BNDG GAUZE ELAST 4 BULKY (GAUZE/BANDAGES/DRESSINGS) IMPLANT
CANISTER LIPO FAT HARVEST (MISCELLANEOUS) IMPLANT
CANISTER SUCT 1200ML W/VALVE (MISCELLANEOUS) ×2 IMPLANT
CHLORAPREP W/TINT 26 (MISCELLANEOUS) ×4 IMPLANT
COVER BACK TABLE 60X90IN (DRAPES) ×2 IMPLANT
COVER MAYO STAND STRL (DRAPES) ×2 IMPLANT
COVER WAND RF STERILE (DRAPES) IMPLANT
DECANTER SPIKE VIAL GLASS SM (MISCELLANEOUS) IMPLANT
DERMABOND ADVANCED (GAUZE/BANDAGES/DRESSINGS)
DERMABOND ADVANCED .7 DNX12 (GAUZE/BANDAGES/DRESSINGS) IMPLANT
DRAPE LAPAROSCOPIC ABDOMINAL (DRAPES) IMPLANT
DRAPE TOP ARMCOVERS (MISCELLANEOUS) ×2 IMPLANT
DRAPE U-SHAPE 76X120 STRL (DRAPES) ×2 IMPLANT
DRAPE UTILITY XL STRL (DRAPES) ×2 IMPLANT
DRSG PAD ABDOMINAL 8X10 ST (GAUZE/BANDAGES/DRESSINGS) ×4 IMPLANT
ELECT COATED BLADE 2.86 ST (ELECTRODE) IMPLANT
ELECT REM PT RETURN 9FT ADLT (ELECTROSURGICAL) ×2
ELECTRODE REM PT RTRN 9FT ADLT (ELECTROSURGICAL) ×1 IMPLANT
EXTRACTOR CANIST REVOLVE STRL (CANNISTER) ×2 IMPLANT
GAUZE SPONGE 4X4 12PLY STRL (GAUZE/BANDAGES/DRESSINGS) ×2 IMPLANT
GLOVE SRG 8 PF TXTR STRL LF DI (GLOVE) ×1 IMPLANT
GLOVE SURG ENC TEXT LTX SZ7.5 (GLOVE) ×2 IMPLANT
GLOVE SURG POLYISO LF SZ6.5 (GLOVE) ×2 IMPLANT
GLOVE SURG POLYISO LF SZ8 (GLOVE) ×2 IMPLANT
GLOVE SURG UNDER POLY LF SZ7 (GLOVE) ×4 IMPLANT
GLOVE SURG UNDER POLY LF SZ8 (GLOVE) ×2
GOWN STRL REUS W/ TWL LRG LVL3 (GOWN DISPOSABLE) ×3 IMPLANT
GOWN STRL REUS W/TWL 2XL LVL3 (GOWN DISPOSABLE) ×2 IMPLANT
GOWN STRL REUS W/TWL LRG LVL3 (GOWN DISPOSABLE) ×6
LINER CANISTER 1000CC FLEX (MISCELLANEOUS) ×2 IMPLANT
NDL SAFETY ECLIPSE 18X1.5 (NEEDLE) IMPLANT
NEEDLE HYPO 18GX1.5 SHARP (NEEDLE)
NS IRRIG 1000ML POUR BTL (IV SOLUTION) ×2 IMPLANT
PACK BASIN DAY SURGERY FS (CUSTOM PROCEDURE TRAY) ×2 IMPLANT
PAD ALCOHOL SWAB (MISCELLANEOUS) ×2 IMPLANT
PENCIL SMOKE EVACUATOR (MISCELLANEOUS) ×2 IMPLANT
SHEET MEDIUM DRAPE 40X70 STRL (DRAPES) ×4 IMPLANT
SLEEVE SCD COMPRESS KNEE MED (STOCKING) ×2 IMPLANT
SPONGE LAP 18X18 RF (DISPOSABLE) ×2 IMPLANT
STAPLER VISISTAT 35W (STAPLE) IMPLANT
STRIP SUTURE WOUND CLOSURE 1/2 (MISCELLANEOUS) ×4 IMPLANT
SUT CHROMIC 5 0 P 3 (SUTURE) ×2 IMPLANT
SUT CHROMIC 5 0 RB 1 27 (SUTURE) IMPLANT
SUT MNCRL AB 4-0 PS2 18 (SUTURE) ×2 IMPLANT
SUT PLAIN 5 0 P 3 18 (SUTURE) ×4 IMPLANT
SUT VIC AB 3-0 PS1 18 (SUTURE)
SUT VIC AB 3-0 PS1 18XBRD (SUTURE) IMPLANT
SUT VIC AB 3-0 SH 27 (SUTURE)
SUT VIC AB 3-0 SH 27X BRD (SUTURE) IMPLANT
SUT VICRYL 4-0 PS2 18IN ABS (SUTURE) IMPLANT
SUT VLOC 180 P-14 24 (SUTURE) ×2 IMPLANT
SYR 10ML LL (SYRINGE) ×10 IMPLANT
SYR 50ML LL SCALE MARK (SYRINGE) ×2 IMPLANT
SYR BULB IRRIG 60ML STRL (SYRINGE) IMPLANT
SYR CONTROL 10ML LL (SYRINGE) IMPLANT
SYR TB 1ML LL NO SAFETY (SYRINGE) IMPLANT
SYR TOOMEY 50ML (SYRINGE) ×2 IMPLANT
SYR TOOMEY IRRIG 70ML (MISCELLANEOUS)
SYRINGE TOOMEY IRRIG 70ML (MISCELLANEOUS) IMPLANT
TOWEL GREEN STERILE FF (TOWEL DISPOSABLE) ×4 IMPLANT
TUBE CONNECTING 20X1/4 (TUBING) ×2 IMPLANT
TUBING INFILTRATION IT-10001 (TUBING) ×2 IMPLANT
TUBING SET GRADUATE ASPIR 12FT (MISCELLANEOUS) ×2 IMPLANT
UNDERPAD 30X36 HEAVY ABSORB (UNDERPADS AND DIAPERS) ×4 IMPLANT
YANKAUER SUCT BULB TIP NO VENT (SUCTIONS) ×2 IMPLANT

## 2020-11-15 NOTE — Discharge Instructions (Signed)
Activity: As tolerated, but avoid strenuous activity until follow up visit.  Diet: Regular  Wound Care: Keep dressing clean & dry for 2 days.  After that you can shower normally.  Redress the wound as needed for comfort.  Keep compression on abdomen as much as possible.  Special Instructions:  Call our office if any unusual problems occur such as pain, excessive bleeding, unrelieved nausea/vomiting, fever &/or chills.  Follow-up appointment: Scheduled for next week.  *You had 1000 mg of Tylenol at 11:00 AM *You had 5 mg of Oxycodone at   [place] Instructions  Activity: Get plenty of rest for the remainder of the day. A responsible individual must stay with you for 24 hours following the procedure.  For the next 24 hours, DO NOT: -Drive a car -Paediatric nurse -Drink alcoholic beverages -Take any medication unless instructed by your physician -Make any legal decisions or sign important papers.  Meals: Start with liquid foods such as gelatin or soup. Progress to regular foods as tolerated. Avoid greasy, spicy, heavy foods. If nausea and/or vomiting occur, drink only clear liquids until the nausea and/or vomiting subsides. Call your physician if vomiting continues.  Special Instructions/Symptoms: Your throat may feel dry or sore from the anesthesia or the breathing tube placed in your throat during surgery. If this causes discomfort, gargle with warm salt water. The discomfort should disappear within 24 hours.  If you had a scopolamine patch placed behind your ear for the management of post- operative nausea and/or vomiting:  1. The medication in the patch is effective for 72 hours, after which it should be removed.  Wrap patch in a tissue and discard in the trash. Wash hands thoroughly with soap and water. 2. You may remove the patch earlier than 72 hours if you experience unpleasant side effects which may include dry mouth, dizziness or visual disturbances. 3.  Avoid touching the patch. Wash your hands with soap and water after contact with the patch.

## 2020-11-15 NOTE — Anesthesia Postprocedure Evaluation (Signed)
Anesthesia Post Note  Patient: Kirsten James  Procedure(s) Performed: Right breast fat grafting and excess skin excision (Right Breast)     Patient location during evaluation: PACU Anesthesia Type: General Level of consciousness: awake and alert, oriented and patient cooperative Pain management: pain level controlled Vital Signs Assessment: post-procedure vital signs reviewed and stable Respiratory status: spontaneous breathing, nonlabored ventilation and respiratory function stable Cardiovascular status: blood pressure returned to baseline and stable Postop Assessment: no apparent nausea or vomiting Anesthetic complications: no   No complications documented.  Last Vitals:  Vitals:   11/15/20 1513 11/15/20 1514  BP: 133/79   Pulse:  75  Resp:  (!) 9  Temp:  36.6 C  SpO2:  100%    Last Pain:  Vitals:   11/15/20 1054  TempSrc: Oral  PainSc: 0-No pain                 Pervis Hocking

## 2020-11-15 NOTE — Transfer of Care (Signed)
Immediate Anesthesia Transfer of Care Note  Patient: Kirsten James  Procedure(s) Performed: Right breast fat grafting and excess skin excision (Right Breast)  Patient Location: PACU  Anesthesia Type:General  Level of Consciousness: drowsy, patient cooperative and responds to stimulation  Airway & Oxygen Therapy: Patient Spontanous Breathing and Patient connected to face mask oxygen  Post-op Assessment: Report given to RN and Post -op Vital signs reviewed and stable  Post vital signs: Reviewed and stable  Last Vitals:  Vitals Value Taken Time  BP 133/79 11/15/20 1513  Temp    Pulse 75 11/15/20 1514  Resp 9 11/15/20 1514  SpO2 100 % 11/15/20 1514    Last Pain:  Vitals:   11/15/20 1054  TempSrc: Oral  PainSc: 0-No pain      Patients Stated Pain Goal: 2 (36/62/94 [id number])  Complications: No complications documented.

## 2020-11-15 NOTE — Op Note (Signed)
Operative Note   DATE OF OPERATION: 11/15/2020  SURGICAL DEPARTMENT: Plastic Surgery  PREOPERATIVE DIAGNOSES: Status post right breast reconstruction with soft tissue deficiency in the upper pole along with small standing cutaneous deformities medially and laterally  POSTOPERATIVE DIAGNOSES:  same  PROCEDURE: 1.  Fat grafting to right breast totaling 170 cc 2.  Fat grafting to the left breast totaling 70 cc 3.  Excision of excess skin and standing cutaneous deformities of the right breast totaling 4 cm medially and 4 cm laterally with complex closure  SURGEON: Talmadge Coventry, MD  ASSISTANT: [place], RNFA The advanced practice practitioner (APP) assisted throughout the case.  The APP was essential in retraction and counter traction when needed to make the case progress smoothly.  This retraction and assistance made it possible to see the tissue plans for the procedure.  The assistance was needed for blood control, tissue re-approximation and assisted with closure of the incision site.  ANESTHESIA:  General.   COMPLICATIONS: None.   INDICATIONS FOR PROCEDURE:  The patient, Kirsten James is a 55 y.o. female born on 06-02-1966, is here for treatment of contour irregularity after right breast reconstruction MRN: [id number]  CONSENT:  Informed consent was obtained directly from the patient. Risks, benefits and alternatives were fully discussed. Specific risks including but not limited to bleeding, infection, hematoma, seroma, scarring, pain, contracture, asymmetry, wound healing problems, and need for further surgery were all discussed. The patient did have an ample opportunity to have questions answered to satisfaction.   DESCRIPTION OF PROCEDURE:  The patient was taken to the operating room. SCDs were placed and antibiotics were given.  General anesthesia was administered.  The patient's operative site was prepped and draped in a sterile fashion. A time out was performed and  all information was confirmed to be correct.  I had marked out preoperatively for soft tissue deficiency in the upper pole on the right side.  I also marked the anticipated excision of the standing cutaneous deformities medially and laterally along her scar.  Tumescent solution was then infiltrated throughout the abdomen and a small amount in the preaxillary areas for anticipated liposuction there.  While the tumescent was taking effect I excised the dogears medially and laterally from the right breast.  They each totaled about 4 cm in length.  They were both sent individually to pathology.  Hemostasis was obtained.  Surrounding skin in both locations was gently undermined and advanced and closed in layers with interrupted buried 4-0 Monocryl sutures and a running 3 OV lock.  At this point the revolve system was hooked up and power assisted liposuction was done in the abdomen and preaxillary areas until it was full.  The fat was then processed in accordance with their instructions and placed into 10 cc syringes.  Infiltration cannulas were then utilized to inject 170 cc in the right breast which was mostly directed towards the upper pole and 70 cc were infiltrated into the left breast through small stab incisions.  All stab incisions were then closed with 5-0 fast gut sutures.  Steri-Strips were applied to the dogear excisions and an abdominal binder was wrapped around the abdomen.  The patient tolerated the procedure well.  There were no complications. The patient was allowed to wake from anesthesia, extubated and taken to the recovery room in satisfactory condition.

## 2020-11-15 NOTE — Anesthesia Procedure Notes (Signed)
Procedure Name: LMA Insertion Performed by: Verita Lamb, CRNA Pre-anesthesia Checklist: Patient identified, Emergency Drugs available, Suction available and Patient being monitored Patient Re-evaluated:Patient Re-evaluated prior to induction Oxygen Delivery Method: Circle system utilized Preoxygenation: Pre-oxygenation with 100% oxygen Induction Type: IV induction Ventilation: Mask ventilation without difficulty LMA: LMA inserted LMA Size: 4.0 Number of attempts: 1 Airway Equipment and Method: Bite block Placement Confirmation: positive ETCO2,  CO2 detector and breath sounds checked- equal and bilateral Tube secured with: Tape Dental Injury: Teeth and Oropharynx as per pre-operative assessment

## 2020-11-15 NOTE — Brief Op Note (Signed)
11/15/2020  3:00 PM  PATIENT:  Kirsten James  55 y.o. female  PRE-OPERATIVE DIAGNOSIS:  status post mastectomy, Carcinoma of overlapping sites of right breast in female, estrogen receptor positive  POST-OPERATIVE DIAGNOSIS:  status post mastectomy, Carcinoma of overlapping sites of right breast in female, estrogen receptor positive  PROCEDURE:  Procedure(s): Right breast fat grafting and excess skin excision (Right)  SURGEON:  Surgeon(s) and Role:    * Gavin Telford, Steffanie Dunn, MD - Primary  PHYSICIAN ASSISTANT: [place], RNFA  ASSISTANTS: none   ANESTHESIA:   general  EBL:  30 mL   BLOOD ADMINISTERED:none  DRAINS: none   LOCAL MEDICATIONS USED:  MARCAINE     SPECIMEN:  Source of Specimen:  right breast medial and lateral skin  DISPOSITION OF SPECIMEN:  PATHOLOGY  COUNTS:  YES  TOURNIQUET:  * No tourniquets in log *  DICTATION: .Dragon Dictation  PLAN OF CARE: Discharge to home after PACU  PATIENT DISPOSITION:  PACU - hemodynamically stable.   Delay start of Pharmacological VTE agent (>24hrs) due to surgical blood loss or risk of bleeding: not applicable

## 2020-11-15 NOTE — Interval H&P Note (Signed)
History and Physical Interval Note:  11/15/2020 12:35 PM  Kirsten James  has presented today for surgery, with the diagnosis of status post mastectomy, Carcinoma of overlapping sites of right breast in female, estrogen receptor positive.  The various methods of treatment have been discussed with the patient and family. After consideration of risks, benefits and other options for treatment, the patient has consented to  Procedure(s): Right breast fat grafting and excess skin excision (Right) as a surgical intervention.  The patient's history has been reviewed, patient examined, no change in status, stable for surgery.  I have reviewed the patient's chart and labs.  Questions were answered to the patient's satisfaction.     [name]

## 2020-11-16 ENCOUNTER — Encounter (HOSPITAL_BASED_OUTPATIENT_CLINIC_OR_DEPARTMENT_OTHER): Payer: Self-pay | Admitting: Plastic Surgery

## 2020-11-17 LAB — SURGICAL PATHOLOGY

## 2020-11-23 ENCOUNTER — Ambulatory Visit (INDEPENDENT_AMBULATORY_CARE_PROVIDER_SITE_OTHER): Payer: BC Managed Care – PPO | Admitting: Surgical

## 2020-11-23 ENCOUNTER — Other Ambulatory Visit: Payer: Self-pay

## 2020-11-23 DIAGNOSIS — Z9011 Acquired absence of right breast and nipple: Secondary | ICD-10-CM

## 2020-11-23 DIAGNOSIS — Z170 Estrogen receptor positive status [ER+]: Secondary | ICD-10-CM

## 2020-11-23 DIAGNOSIS — C50811 Malignant neoplasm of overlapping sites of right female breast: Secondary | ICD-10-CM

## 2020-11-23 NOTE — Progress Notes (Signed)
Patient is a 55 year old female here for follow-up after fat grafting to her right breast and left breast and excision of excess skin with Dr. Claudia Desanctis on 11/15/2020.  She is 1 week postop.  Patient reports she is overall doing well.  She has bruising but reports that this is improving.  She has been using Arnica gel which she reports has been helping a lot.  She reports that pain is improving.  She is not having any infectious symptoms.  Chaperone present on exam On exam bilateral breast incisions are intact.  Bruising noted over right and left breast.  This is improving.  Left NAC is viable with good color.  No wounds noted.  No drainage noted.  Abdominal incisions are intact and healing well.  No cellulitic changes.  Some bruising noted over abdomen.  Recommend continue her compressive garment whether that is abdominal binder or spanks 24/7 for 1 or 2 more weeks.  She can then transition to just wearing it throughout the day for total of 6 weeks after surgery. Continue with compression garment to breast.  She is interested in nipple areola tattooing, will have nursing staff reach out to patient to schedule nipple areola tattooing session.  All of her questions were answered to her content.  There is no sign of infection, seroma, hematoma.  We discussed scheduling additional follow-up or following up as needed.  Patient would like to follow-up on an as-needed basis as she lives approximately 40 minutes away at this time.  We remain available as needed.

## 2020-12-23 ENCOUNTER — Other Ambulatory Visit: Payer: Self-pay | Admitting: Adult Health

## 2021-01-02 ENCOUNTER — Ambulatory Visit: Payer: BC Managed Care – PPO | Admitting: Adult Health

## 2021-01-09 ENCOUNTER — Encounter: Payer: Self-pay | Admitting: Adult Health

## 2021-01-12 ENCOUNTER — Ambulatory Visit: Payer: BC Managed Care – PPO | Admitting: Adult Health

## 2021-01-17 ENCOUNTER — Ambulatory Visit: Payer: BC Managed Care – PPO | Admitting: Adult Health

## 2021-01-19 ENCOUNTER — Ambulatory Visit: Payer: BC Managed Care – PPO

## 2021-01-20 ENCOUNTER — Ambulatory Visit (INDEPENDENT_AMBULATORY_CARE_PROVIDER_SITE_OTHER): Payer: BC Managed Care – PPO | Admitting: Family Medicine

## 2021-01-20 ENCOUNTER — Other Ambulatory Visit: Payer: Self-pay

## 2021-01-20 ENCOUNTER — Encounter: Payer: Self-pay | Admitting: Family Medicine

## 2021-01-20 VITALS — BP 134/84 | HR 71 | Temp 98.6°F | Resp 16 | Ht 69.0 in | Wt 227.0 lb

## 2021-01-20 DIAGNOSIS — Z1231 Encounter for screening mammogram for malignant neoplasm of breast: Secondary | ICD-10-CM | POA: Diagnosis not present

## 2021-01-20 DIAGNOSIS — Z9011 Acquired absence of right breast and nipple: Secondary | ICD-10-CM | POA: Diagnosis not present

## 2021-01-20 DIAGNOSIS — Z0000 Encounter for general adult medical examination without abnormal findings: Secondary | ICD-10-CM

## 2021-01-20 DIAGNOSIS — I10 Essential (primary) hypertension: Secondary | ICD-10-CM | POA: Diagnosis not present

## 2021-01-20 MED ORDER — DILTIAZEM HCL ER COATED BEADS 240 MG PO CP24: 240.0000 mg | ORAL_CAPSULE | Freq: Every day | ORAL | 2 refills | Status: DC

## 2021-01-20 NOTE — Progress Notes (Signed)
Complete physical exam   Patient: Kirsten James   DOB: 27-Sep-1965   55 y.o. Female  MRN: [id number] Visit Date: 01/20/2021  Today's healthcare provider: Vernie Murders, PA-C   Chief Complaint  Patient presents with   Annual Exam   Subjective    Kirsten James is a 55 y.o. female who presents today for a complete physical exam.  She reports consuming a general diet. The patient does not participate in regular exercise at present. She generally feels well. She reports sleeping well. She does not have additional problems to discuss today.    Past Medical History:  Diagnosis Date   Allergy    Complication of anesthesia    hiccups    Dysplastic nevus 06/16/2020   L buttocks   Frequent urinary tract infections    Hypertension    Past Surgical History:  Procedure Laterality Date   BREAST BIOPSY Right 04/05/2020   [place] bx, Q shape marker 9:00, path pending   BREAST BIOPSY Right 04/05/2020   [place] bx, x shape marker 1:00, path pending   BREAST RECONSTRUCTION WITH PLACEMENT OF TISSUE EXPANDER AND FLEX HD (ACELLULAR HYDRATED DERMIS) Right 04/25/2020   Procedure: BREAST RECONSTRUCTION WITH PLACEMENT OF TISSUE EXPANDER AND FLEX HD (ACELLULAR HYDRATED DERMIS);  Surgeon: [name], MD;  Location: [facility];  Service: Plastics;  Laterality: Right;   CHOLECYSTECTOMY     L4-5 herniated disc repair     LIPOSUCTION WITH LIPOFILLING Right 11/15/2020   Procedure: Right breast fat grafting and excess skin excision;  Surgeon: [name], MD;  Location: [place];  Service: Plastics;  Laterality: Right;   MASTOPEXY Left 06/14/2020   Procedure: LEFT BREAST MASTOPEXY;  Surgeon: [name], MD;  Location: [place];  Service: Plastics;  Laterality: Left;   NASAL SINUS SURGERY  2005   PLANTAR FASCIA RELEASE     REMOVAL OF TISSUE EXPANDER AND PLACEMENT OF IMPLANT Right 06/14/2020   Procedure: REMOVAL OF TISSUE EXPANDER AND PLACEMENT OF  IMPLANT;  Surgeon: [name], MD;  Location: [place];  Service: Plastics;  Laterality: Right;   SIMPLE MASTECTOMY WITH AXILLARY SENTINEL NODE BIOPSY Right 04/25/2020   Procedure: SIMPLE MASTECTOMY WITH AXILLARY SENTINEL NODE BIOPSY;  Surgeon: Ronny Bacon, MD;  Location: [facility];  Service: General;  Laterality: Right;   Social History   Socioeconomic History   Marital status: Married    Spouse name: Not on file   Number of children: Not on file   Years of education: Not on file   Highest education level: Not on file  Occupational History   Not on file  Tobacco Use   Smoking status: Former    Pack years: 0.00    Types: Cigarettes    Quit date: 10/12/2019    Years since quitting: 1.2   Smokeless tobacco: Never  Substance and Sexual Activity   Alcohol use: Never   Drug use: Never   Sexual activity: Not Currently  Other Topics Concern   Not on file  Social History Narrative   Quit smoking [teen- 1995; quit]; no alcohol. Lives in [place] by self; daughter in [place]. RN- Scientist, research (medical).    Social Determinants of Health   Financial Resource Strain: Not on file  Food Insecurity: Not on file  Transportation Needs: Not on file  Physical Activity: Not on file  Stress: Not on file  Social Connections: Not on file  Intimate Partner Violence: Not on file  Family Status  Relation Name Status   Mother  Deceased   Father  Deceased   MGM  (Not Specified)   Mat Uncle  (Not Specified)   Family History  Problem Relation Age of Onset   Cancer Mother        non-small cell lung cancer- smoker   Cancer Father        oral & larynx- smoker   Alzheimer's disease Maternal Grandmother    Melanoma Maternal Uncle    Allergies  Allergen Reactions   Other     Allergy to pollen and mold- patient reports watery eyes, runny nose and sneezing as reaction   Penicillins Hives   Amoxicillin Hives    Patient Care Team: Doreen Beam, FNP  as PCP - General (Family Medicine) Cammie Sickle, MD as Consulting Physician (Internal Medicine) Ronny Bacon, MD as Consulting Physician (General Surgery)   Medications: Outpatient Medications Prior to Visit  Medication Sig   Cholecalciferol (VITAMIN D3) 125 MCG (5000 UT) TABS Take 5,000 Units by mouth daily.   diltiazem (CARTIA XT) 240 MG 24 hr capsule Take 1 capsule (240 mg total) by mouth daily.   Fexofenadine-Pseudoephedrine (ALLEGRA-D 24 HOUR PO) Take 180 mg by mouth as needed.   FIBER PO Take 1 capsule by mouth daily.   fluticasone (FLONASE) 50 MCG/ACT nasal spray Place 1 spray into both nostrils daily.   Multiple Vitamin (MULTIVITAMIN WITH MINERALS) TABS tablet Take 1 tablet by mouth daily.   nitrofurantoin (MACRODANTIN) 50 MG capsule TAKE 1 CAPSULE DAILY AS NEEDED FOR URINARY TRACT INFECTION SYMPTOMS, AFTER SEXUAL INTERCOURSE AS DIRECTED   Turmeric Curcumin 500 MG CAPS Take 500 mg by mouth daily.    No facility-administered medications prior to visit.    Review of Systems  All other systems reviewed and are negative.    Objective    BP 134/84   Pulse 71   Temp 98.6 F (37 C)   Resp 16   Ht 5\' 9"  (1.753 m)   Wt 227 lb (103 kg)   LMP 11/14/2018 (Exact Date)   BMI 33.52 kg/m  BP Readings from Last 3 Encounters:  01/20/21 134/84  11/15/20 (!) 145/85  11/04/20 (!) 166/92   Wt Readings from Last 3 Encounters:  01/20/21 227 lb (103 kg)  11/15/20 218 lb 11.1 oz (99.2 kg)  11/04/20 219 lb 9.6 oz (99.6 kg)   Physical Exam Constitutional:      Appearance: She is well-developed.  HENT:     Head: Normocephalic and atraumatic.     Right Ear: External ear normal.     Left Ear: External ear normal.     Nose: Nose normal.  Eyes:     General:        Right eye: No discharge.     Conjunctiva/sclera: Conjunctivae normal.     Pupils: Pupils are equal, round, and reactive to light.  Neck:     Thyroid: No thyromegaly.     Trachea: No tracheal deviation.   Cardiovascular:     Rate and Rhythm: Normal rate and regular rhythm.     Heart sounds: Normal heart sounds. No murmur heard. Pulmonary:     Effort: Pulmonary effort is normal. No respiratory distress.     Breath sounds: Normal breath sounds. No wheezing or rales.  Chest:     Chest wall: No tenderness.  Abdominal:     General: There is no distension.     Palpations: Abdomen is soft. There is no mass.  Tenderness: There is no abdominal tenderness. There is no guarding or rebound.  Genitourinary:    Comments: Had PAP and pelvic exam November 2021 and right mastectomy October 2021. Musculoskeletal:        General: No tenderness. Normal range of motion.     Cervical back: Normal range of motion and neck supple.  Lymphadenopathy:     Cervical: No cervical adenopathy.  Skin:    General: Skin is warm and dry.     Findings: No erythema or rash.  Neurological:     Mental Status: She is alert and oriented to person, place, and time.     Cranial Nerves: No cranial nerve deficit.     Motor: No abnormal muscle tone.     Coordination: Coordination normal.     Deep Tendon Reflexes: Reflexes are normal and symmetric. Reflexes normal.  Psychiatric:        Behavior: Behavior normal.        Thought Content: Thought content normal.        Judgment: Judgment normal.      Last depression screening scores PHQ 2/9 Scores 01/20/2021 06/06/2020 09/15/2019  PHQ - 2 Score 0 0 0  PHQ- 9 Score 3 3 0   Last fall risk screening Fall Risk  01/20/2021  Falls in the past year? 0  Number falls in past yr: 0  Injury with Fall? 0  Risk for fall due to : No Fall Risks  Follow up Falls evaluation completed   Last Audit-C alcohol use screening Alcohol Use Disorder Test (AUDIT) 01/20/2021  1. How often do you have a drink containing alcohol? 1  2. How many drinks containing alcohol do you have on a typical day when you are drinking? 0  3. How often do you have six or more drinks on one occasion? 0  AUDIT-C  Score 1   A score of 3 or more in women, and 4 or more in men indicates increased risk for alcohol abuse, EXCEPT if all of the points are from question 1   No results found for any visits on 01/20/21.  Assessment & Plan    Routine Health Maintenance and Physical Exam  Exercise Activities and Dietary recommendations  Goals   Encouraged to exercise 30-40 minutes 3-4 days a week and follow regular low fat diet.     Immunization History  Administered Date(s) Administered   PFIZER(Purple Top)SARS-COV-2 Vaccination 08/10/2019, 08/31/2019   PPD Test 05/01/2019    Health Maintenance  Topic Date Due   Pneumococcal Vaccine 44-56 Years old (1 - PCV) Never done   Zoster Vaccines- Shingrix (1 of 2) Never done   Fecal DNA (Cologuard)  Never done   COVID-19 Vaccine (3 - Pfizer risk series) 09/28/2019   Hepatitis C Screening  02/17/2021 (Originally 11/15/1983)   HIV Screening  02/17/2021 (Originally 11/14/1980)   TETANUS/TDAP  06/06/2021 (Originally 11/14/1984)   INFLUENZA VACCINE  02/13/2021   MAMMOGRAM  03/10/2022   PAP SMEAR-Modifier  06/07/2023   HPV VACCINES  Aged Out    Discussed health benefits of physical activity, and encouraged her to engage in regular exercise appropriate for her age and condition.  1. Annual physical exam General health good. Will get routine labs. Counseled regarding health maintenance. Had normal PAP 06-14-20. - CBC with Differential/Platelet - Comprehensive metabolic panel - Lipid panel - TSH  2. Encounter for screening mammogram for malignant neoplasm of breast - MM Digital Screening; Future  3. S/P mastectomy, right History of mastectomy 04-25-20 for  2 foci of invasive estrogen receptor positive invasive breast cancer. Followed by Dr. Rogue Bussing (oncologist), Dr. Christian Mate (general surgeon) and plastic surgeon (Dr. Claudia Desanctis).  4. Essential hypertension BP well controlled. Needs refill of Diltiazem and follow up labs. - CBC with Differential/Platelet -  Comprehensive metabolic panel - Lipid panel - TSH - diltiazem (CARTIA XT) 240 MG 24 hr capsule; Take 1 capsule (240 mg total) by mouth daily.  Dispense: 90 capsule; Refill: 2   No follow-ups on file.     I, Julene Rahn, PA-C, have reviewed all documentation for this visit. The documentation on 04/08/21 for the exam, diagnosis, procedures, and orders are all accurate and complete.    Vernie Murders, PA-C  [employer] [phone] (phone) [phone] (fax)  [place]

## 2021-01-21 LAB — LIPID PANEL
Chol/HDL Ratio: 3 ratio (ref 0.0–4.4)
Cholesterol, Total: 225 mg/dL — ABNORMAL HIGH (ref 100–199)
HDL: 75 mg/dL (ref >39)
LDL Chol Calc (NIH): 130 mg/dL — ABNORMAL HIGH (ref 0–99)
Triglycerides: 116 mg/dL (ref 0–149)
VLDL Cholesterol Cal: 20 mg/dL (ref 5–40)

## 2021-01-21 LAB — CBC WITH DIFFERENTIAL/PLATELET
Basophils Absolute: 0.1 10*3/uL (ref 0.0–0.2)
Basos: 1 %
EOS (ABSOLUTE): 0.1 10*3/uL (ref 0.0–0.4)
Eos: 1 %
Hematocrit: 45.9 % (ref 34.0–46.6)
Hemoglobin: 15.1 g/dL (ref 11.1–15.9)
Immature Grans (Abs): 0 10*3/uL (ref 0.0–0.1)
Immature Granulocytes: 0 %
Lymphocytes Absolute: 3 10*3/uL (ref 0.7–3.1)
Lymphs: 36 %
MCH: 28.2 pg (ref 26.6–33.0)
MCHC: 32.9 g/dL (ref 31.5–35.7)
MCV: 86 fL (ref 79–97)
Monocytes Absolute: 0.6 10*3/uL (ref 0.1–0.9)
Monocytes: 7 %
Neutrophils Absolute: 4.6 10*3/uL (ref 1.4–7.0)
Neutrophils: 55 %
Platelets: 313 10*3/uL (ref 150–450)
RBC: 5.36 x10E6/uL — ABNORMAL HIGH (ref 3.77–5.28)
RDW: 13.3 % (ref 11.7–15.4)
WBC: 8.3 10*3/uL (ref 3.4–10.8)

## 2021-01-21 LAB — COMPREHENSIVE METABOLIC PANEL
ALT: 19 IU/L (ref 0–32)
AST: 17 IU/L (ref 0–40)
Albumin/Globulin Ratio: 1.7 (ref 1.2–2.2)
Albumin: 4.8 g/dL (ref 3.8–4.9)
Alkaline Phosphatase: 112 IU/L (ref 44–121)
BUN/Creatinine Ratio: 10 (ref 9–23)
BUN: 8 mg/dL (ref 6–24)
Bilirubin Total: 0.3 mg/dL (ref 0.0–1.2)
CO2: 24 mmol/L (ref 20–29)
Calcium: 10.3 mg/dL — ABNORMAL HIGH (ref 8.7–10.2)
Chloride: 98 mmol/L (ref 96–106)
Creatinine, Ser: 0.83 mg/dL (ref 0.57–1.00)
Globulin, Total: 2.8 g/dL (ref 1.5–4.5)
Glucose: 89 mg/dL (ref 65–99)
Potassium: 4.7 mmol/L (ref 3.5–5.2)
Sodium: 140 mmol/L (ref 134–144)
Total Protein: 7.6 g/dL (ref 6.0–8.5)
eGFR: 83 mL/min/{1.73_m2} (ref >59)

## 2021-01-21 LAB — TSH: TSH: 1.07 u[IU]/mL (ref 0.450–4.500)

## 2021-01-23 ENCOUNTER — Encounter: Payer: Self-pay | Admitting: Family Medicine

## 2021-01-23 ENCOUNTER — Other Ambulatory Visit: Payer: Self-pay

## 2021-01-23 ENCOUNTER — Ambulatory Visit (INDEPENDENT_AMBULATORY_CARE_PROVIDER_SITE_OTHER): Payer: BC Managed Care – PPO

## 2021-01-23 VITALS — BP 138/84 | HR 71 | Temp 98.3°F | Ht 69.0 in | Wt 227.0 lb

## 2021-01-23 DIAGNOSIS — Z9011 Acquired absence of right breast and nipple: Secondary | ICD-10-CM

## 2021-02-01 NOTE — Patient Instructions (Signed)
Patient was instructed to use vaseline/xeroform & gauze dressing until area is healed ~1 week. She understands that ink/colors will fade up to 30% & this could require touch-up sessions in the future. She will call for any concerns or questions

## 2021-02-01 NOTE — Progress Notes (Signed)
NIPPLE AREOLAR TATTOO PROCEDURE  PREOPERATIVE DIAGNOSIS:  Acquired absence of right nipple areolar   POSTOPERATIVE DIAGNOSIS: Acquired absence of right nipple areolar    PROCEDURES:  right nipple areolar tattoo   ATTENDING SURGEON: Dr. Claudia Desanctis  ANESTHESIA:  EMLA  COMPLICATIONS: None.  JUSTIFICATION FOR PROCEDURE:  Kirsten James is a 55 y.o. female with a history of right breast cancer status post right breast reconstruction.  The patient presents for right nipple areolar complex tattoo. Risks, benefits, indications, and alternatives of the above described procedures were discussed with the patient and all the patient's questions were answered.   DESCRIPTION OF PROCEDURE: After informed consent was obtained and proper identification of patient and surgical site was made, the patient was taken to the procedure room and photos taken & entered into chart. Placement/size/shape & colors were chosen by patient & myself for right NAC.  Patient is then placed supine on the operating room table. A time out was performed to confirm patient's identity and surgical site. The patient was prepped and draped in the usual sterile fashion.   Using a # 7 pronged [employer] tattoo head, pigment was instilled to the designed nipple areolar complex.  Once adequate pigment had been applied to the nipple areolar complex, a post-procedure photo taken. Vaseline/ xeroform & gauze dressing was applied.  The patient tolerated the procedure well.  After care instructions reviewed with patient.   World Famous Tattoo Ink used: Bright Peach / Equinox / [place] / Josph Macho Skin / Fair Honey Ultra Duration used for topical anesthesia Lot#'s & exp dates on file

## 2021-03-09 ENCOUNTER — Ambulatory Visit: Payer: BC Managed Care – PPO

## 2021-03-22 ENCOUNTER — Ambulatory Visit
Admission: RE | Admit: 2021-03-22 | Discharge: 2021-03-22 | Disposition: A | Discharge status: Home / Self Care | Payer: BC Managed Care – PPO | Source: Ambulatory Visit | Attending: Family Medicine | Admitting: Family Medicine

## 2021-03-22 ENCOUNTER — Other Ambulatory Visit: Payer: Self-pay

## 2021-03-22 DIAGNOSIS — Z1231 Encounter for screening mammogram for malignant neoplasm of breast: Secondary | ICD-10-CM | POA: Diagnosis not present

## 2021-03-23 ENCOUNTER — Other Ambulatory Visit: Payer: Self-pay | Admitting: Family Medicine

## 2021-03-23 DIAGNOSIS — R928 Other abnormal and inconclusive findings on diagnostic imaging of breast: Secondary | ICD-10-CM

## 2021-03-23 DIAGNOSIS — N6320 Unspecified lump in the left breast, unspecified quadrant: Secondary | ICD-10-CM

## 2021-03-27 ENCOUNTER — Ambulatory Visit
Admission: RE | Admit: 2021-03-27 | Discharge: 2021-03-27 | Disposition: A | Discharge status: Home / Self Care | Payer: BC Managed Care – PPO | Source: Ambulatory Visit | Attending: Family Medicine | Admitting: Family Medicine

## 2021-03-27 ENCOUNTER — Other Ambulatory Visit: Payer: Self-pay | Admitting: Family Medicine

## 2021-03-27 ENCOUNTER — Ambulatory Visit: Payer: BC Managed Care – PPO

## 2021-03-27 ENCOUNTER — Other Ambulatory Visit: Payer: Self-pay

## 2021-03-27 DIAGNOSIS — N6320 Unspecified lump in the left breast, unspecified quadrant: Secondary | ICD-10-CM

## 2021-03-27 DIAGNOSIS — R928 Other abnormal and inconclusive findings on diagnostic imaging of breast: Secondary | ICD-10-CM | POA: Insufficient documentation

## 2021-04-06 ENCOUNTER — Other Ambulatory Visit: Payer: Self-pay

## 2021-04-06 ENCOUNTER — Ambulatory Visit (INDEPENDENT_AMBULATORY_CARE_PROVIDER_SITE_OTHER): Payer: BC Managed Care – PPO

## 2021-04-06 VITALS — BP 147/87 | HR 80 | Temp 98.2°F | Ht 69.0 in | Wt 215.0 lb

## 2021-04-06 DIAGNOSIS — Z9011 Acquired absence of right breast and nipple: Secondary | ICD-10-CM

## 2021-04-08 ENCOUNTER — Encounter: Payer: Self-pay | Admitting: Family Medicine

## 2021-04-11 NOTE — Progress Notes (Signed)
04/06/21 NIPPLE AREOLAR TATTOO PROCEDURE  PREOPERATIVE DIAGNOSIS:  Acquired absence of right nipple areolar   POSTOPERATIVE DIAGNOSIS: Acquired absence of right nipple areolar    PROCEDURES: right nipple areolar tattoo touch-up  ATTENDING SURGEON: Dr. Claudia Desanctis  ANESTHESIA:  EMLA  COMPLICATIONS: None.  JUSTIFICATION FOR PROCEDURE:  Ms. Dray is a 55 y.o. female with a history of right breast cancer status post right breast reconstruction. The patient presents for nipple areolar complex tattoo. Risks, benefits, indications, and alternatives of the above described procedures were discussed with the patient and all the patient's questions were answered.   DESCRIPTION OF PROCEDURE: After informed consent was obtained and proper identification of patient and surgical site was made, the patient was taken to the procedure room and pre-procedure photo taken. The patient was then placed supine on the operating room table.  A time out was performed to confirm patient's identity and surgical site. The patient was prepped and draped in the usual sterile fashion.  Using a # 7 round pronged  tattoo head, pigment was instilled to the designed nipple areolar complex.  Once adequate pigment had been applied to the nipple areolar complex, vaseline/xeroform & gauze dressing was applied. The patient tolerated the procedure well.   World Famous Tattoo Ink used: Dark Mink / Warm Mink / Tan Mink / Warm Honey / Warm Peach Duration used for topical anesthesia Lot'# & exp dates on file

## 2021-04-11 NOTE — Patient Instructions (Signed)
Use vaseline/xeroform & gauze dressing until healed Call for any concerns

## 2021-06-19 ENCOUNTER — Ambulatory Visit: Payer: BC Managed Care – PPO | Admitting: Dermatology

## 2021-08-16 ENCOUNTER — Other Ambulatory Visit: Payer: Self-pay | Admitting: Family Medicine

## 2021-08-16 DIAGNOSIS — I10 Essential (primary) hypertension: Secondary | ICD-10-CM

## 2021-08-21 ENCOUNTER — Ambulatory Visit: Payer: Self-pay | Admitting: Adult Health

## 2021-08-21 ENCOUNTER — Ambulatory Visit: Payer: Self-pay | Admitting: Family Medicine

## 2021-08-25 ENCOUNTER — Ambulatory Visit (INDEPENDENT_AMBULATORY_CARE_PROVIDER_SITE_OTHER): Payer: 59

## 2021-08-25 ENCOUNTER — Other Ambulatory Visit: Payer: Self-pay

## 2021-08-25 ENCOUNTER — Ambulatory Visit: Payer: 59 | Admitting: Podiatry

## 2021-08-25 ENCOUNTER — Encounter: Payer: Self-pay | Admitting: Podiatry

## 2021-08-25 DIAGNOSIS — M7752 Other enthesopathy of left foot: Secondary | ICD-10-CM | POA: Diagnosis not present

## 2021-08-25 DIAGNOSIS — M722 Plantar fascial fibromatosis: Secondary | ICD-10-CM

## 2021-08-25 MED ORDER — METHYLPREDNISOLONE 4 MG PO TBPK: ORAL_TABLET | ORAL | 0 refills | Status: DC

## 2021-09-04 DIAGNOSIS — M7752 Other enthesopathy of left foot: Secondary | ICD-10-CM | POA: Diagnosis not present

## 2021-09-04 DIAGNOSIS — M722 Plantar fascial fibromatosis: Secondary | ICD-10-CM

## 2021-09-04 MED ORDER — BETAMETHASONE SOD PHOS & ACET 6 (3-3) MG/ML IJ SUSP
3.0000 mg | Freq: Once | INTRAMUSCULAR | Status: AC
  Administered 2021-09-04: 3 mg via INTRA_ARTICULAR

## 2021-09-04 NOTE — Progress Notes (Signed)
° °  Subjective:  56 y.o. female presenting today for evaluation of left foot and ankle pain has been going on for about 2 weeks now.  She does have a history of bilateral heel pain and ankle pain.  Patient states that she had right foot surgery EPF in 2020.  She has been experiencing significant pain and tenderness to the left foot to multiple areas today.  She presents for further treatment and evaluation   Past Medical History:  Diagnosis Date   Allergy    Complication of anesthesia    hiccups    Dysplastic nevus 06/16/2020   L buttocks   Frequent urinary tract infections    Hypertension      Objective / Physical Exam:  General:  The patient is alert and oriented x3 in no acute distress. Dermatology:  Skin is warm, dry and supple bilateral lower extremities. Negative for open lesions or macerations. Vascular:  Palpable pedal pulses bilaterally. No edema or erythema noted. Capillary refill within normal limits. Neurological:  Epicritic and protective threshold grossly intact bilaterally.  Musculoskeletal Exam:  Pain on palpation to the anterior lateral medial aspects of the patient's left ankle as well as along the plantar fascia and insertional area of the posterior tibial tendon onto the navicular tuberosity.. Mild edema noted. Range of motion within normal limits to all pedal and ankle joints bilateral. Muscle strength 5/5 in all groups bilateral.   Radiographic Exam:  Normal osseous mineralization. Joint spaces preserved. No fracture/dislocation/boney destruction.    Assessment: 1.  Diffuse left foot and ankle pain 2.  Plantar fasciitis left 3.  Ankle capsulitis left 4.  Insertional posterior tibial tendinitis left  Plan of Care:  1. Patient was evaluated. X-Rays reviewed.  2. Injection of 0.5 mL Celestone Soluspan injected in the patient's left ankle as well as the plantar fascia and around the navicular tuberosity. 3.  Prescription for Medrol Dosepak 4.  Plantar  fascial brace dispensed.  Wear daily 5.  Recommend OTC arch supports that the patient is currently wearing.  Continue. 6.  Patient is moving back to [place] 09/14/2021.  Recommend follow-up with podiatry in [place] 7.  Return to clinic as needed  *RN Edrick Kins, DPM [facility]  Dr. Edrick Kins, [address]                                        [place], [place] [address]                Office [phone]  Fax [phone]

## 2021-09-09 ENCOUNTER — Other Ambulatory Visit: Payer: Self-pay | Admitting: Family Medicine

## 2021-09-09 DIAGNOSIS — I10 Essential (primary) hypertension: Secondary | ICD-10-CM

## 2021-10-06 ENCOUNTER — Telehealth: Payer: Self-pay | Admitting: Family Medicine

## 2021-10-06 DIAGNOSIS — I10 Essential (primary) hypertension: Secondary | ICD-10-CM

## 2021-10-09 NOTE — Telephone Encounter (Signed)
Requested medications are due for refill today.  yes ? ?Requested medications are on the active medications list.  yes ? ?Last refill. 09/11/2021 #30 0 refills ? ?Future visit scheduled.   no ? ?Notes to clinic.  See notes. Courtesy refill already given. ? ? ? ?Requested Prescriptions  ?Pending Prescriptions Disp Refills  ? diltiazem (CARDIZEM CD) 240 MG 24 hr capsule [Pharmacy Med Name: DILTIAZEM 24H ER(CD) 240 MG CP] 30 capsule 0  ?  Sig: TAKE 1 CAPSULE BY MOUTH EVERY DAY  ?  ? Cardiovascular: Calcium Channel Blockers 3 Failed - 10/06/2021  8:33 AM  ?  ?  Failed - Last BP in normal range  ?  BP Readings from Last 1 Encounters:  ?04/06/21 (!) 147/87  ?  ?  ?  ?  Failed - Valid encounter within last 6 months  ?  Recent Outpatient Visits   ? ?      ? 8 months ago Annual physical exam  ? [place], PA-C  ? 1 year ago Screening for cervical cancer  ? [facility] Flinchum, Kelby Aline, FNP  ? 1 year ago Essential hypertension  ? [facility] Flinchum, Kelby Aline, FNP  ? 1 year ago Body mass index (BMI) of 30.0-30.9 in adult  ? [place], FNP  ? 2 years ago Encounter for routine adult health examination without abnormal findings  ? [place], FNP  ? ?  ?  ? ?  ?  ?  Passed - ALT in normal range and within 360 days  ?  ALT  ?Date Value Ref Range Status  ?01/20/2021 19 0 - 32 IU/L Final  ?  ?  ?  ?  Passed - AST in normal range and within 360 days  ?  AST  ?Date Value Ref Range Status  ?01/20/2021 17 0 - 40 IU/L Final  ?  ?  ?  ?  Passed - Cr in normal range and within 360 days  ?  Creatinine, Ser  ?Date Value Ref Range Status  ?01/20/2021 0.83 0.57 - 1.00 mg/dL Final  ?  ?  ?  ?  Passed - Last Heart Rate in normal range  ?  Pulse Readings from Last 1 Encounters:  ?04/06/21 80  ?  ?  ?  ?  ?  ?

## 2021-10-09 NOTE — Telephone Encounter (Signed)
Called and spoke with pt. She is currently living in [place] helping her son. She is hoping that her Cardizem refill will be extended for a few more months until her return.  ?[facility] [address], [address] [place] [address] [phone] ?

## 2021-10-17 NOTE — Telephone Encounter (Signed)
Pt called in has scheduled an appt and gave correct [facility] [address] [place] [id number] phone number [phone] ?

## 2021-10-18 ENCOUNTER — Other Ambulatory Visit: Payer: Self-pay | Admitting: Physician Assistant

## 2021-10-18 DIAGNOSIS — I10 Essential (primary) hypertension: Secondary | ICD-10-CM

## 2021-10-18 MED ORDER — DILTIAZEM HCL ER COATED BEADS 240 MG PO CP24: ORAL_CAPSULE | ORAL | 0 refills | Status: DC

## 2021-10-23 ENCOUNTER — Ambulatory Visit (INDEPENDENT_AMBULATORY_CARE_PROVIDER_SITE_OTHER): Payer: 59 | Admitting: Physician Assistant

## 2021-10-23 ENCOUNTER — Encounter: Payer: Self-pay | Admitting: Physician Assistant

## 2021-10-23 VITALS — BP 159/90 | HR 93 | Ht 69.0 in | Wt 227.9 lb

## 2021-10-23 DIAGNOSIS — R69 Illness, unspecified: Secondary | ICD-10-CM | POA: Diagnosis not present

## 2021-10-23 DIAGNOSIS — I10 Essential (primary) hypertension: Secondary | ICD-10-CM

## 2021-10-23 DIAGNOSIS — Z1239 Encounter for other screening for malignant neoplasm of breast: Secondary | ICD-10-CM

## 2021-10-23 DIAGNOSIS — N390 Urinary tract infection, site not specified: Secondary | ICD-10-CM

## 2021-10-23 DIAGNOSIS — E782 Mixed hyperlipidemia: Secondary | ICD-10-CM | POA: Diagnosis not present

## 2021-10-23 DIAGNOSIS — R319 Hematuria, unspecified: Secondary | ICD-10-CM | POA: Diagnosis not present

## 2021-10-23 DIAGNOSIS — Z23 Encounter for immunization: Secondary | ICD-10-CM

## 2021-10-23 DIAGNOSIS — J309 Allergic rhinitis, unspecified: Secondary | ICD-10-CM

## 2021-10-23 DIAGNOSIS — N898 Other specified noninflammatory disorders of vagina: Secondary | ICD-10-CM

## 2021-10-23 DIAGNOSIS — Z113 Encounter for screening for infections with a predominantly sexual mode of transmission: Secondary | ICD-10-CM

## 2021-10-23 DIAGNOSIS — Z1159 Encounter for screening for other viral diseases: Secondary | ICD-10-CM

## 2021-10-23 DIAGNOSIS — Z114 Encounter for screening for human immunodeficiency virus [HIV]: Secondary | ICD-10-CM | POA: Diagnosis not present

## 2021-10-23 HISTORY — DX: Urinary tract infection, site not specified: N39.0

## 2021-10-23 HISTORY — DX: Allergic rhinitis, unspecified: J30.9

## 2021-10-23 LAB — POCT URINALYSIS DIPSTICK
Bilirubin, UA: NEGATIVE
Glucose, UA: NEGATIVE
Ketones, UA: NEGATIVE
Nitrite, UA: NEGATIVE
Protein, UA: POSITIVE — AB
Spec Grav, UA: 1.01 (ref 1.010–1.025)
Urobilinogen, UA: 0.2 E.U./dL
pH, UA: 6 (ref 5.0–8.0)

## 2021-10-23 MED ORDER — FLUTICASONE PROPIONATE 50 MCG/ACT NA SUSP: 1.0000 | Freq: Every day | NASAL | 6 refills | Status: DC

## 2021-10-23 MED ORDER — NITROFURANTOIN MACROCRYSTAL 50 MG PO CAPS: ORAL_CAPSULE | ORAL | 3 refills | Status: DC

## 2021-10-23 NOTE — Assessment & Plan Note (Signed)
UA + blood, leuks, sent for culture ?Vaginitis + self swab done, will send--will also test for G/C. ?As pt requests full STI panel, adding HIV and RPR to bloodwork ?Will hold off on treatment until test results come back.  ?

## 2021-10-23 NOTE — Assessment & Plan Note (Addendum)
Pulled for flonase refill. Flonase may also help with symptoms secondary to recent mold exposure ?

## 2021-10-23 NOTE — Assessment & Plan Note (Signed)
Elevated in office today. Pt reports at home 120s/80s. Advised she monitor more closely and bring in home values, f/u 3 mo ?

## 2021-10-23 NOTE — Assessment & Plan Note (Signed)
Pulled for nitrofurantoin refill ?

## 2021-10-23 NOTE — Progress Notes (Signed)
? ?I,Taedyn Glasscock,acting as a scribe for [employer], PA-C.,have documented all relevant documentation on the behalf of Mikey Kirschner, PA-C,as directed by  Mikey Kirschner, PA-C while in the presence of Mikey Kirschner, PA-C. ? ?Established Patient Office Visit ? ?Subjective:  ?Patient ID: Kirsten James, female    DOB: Jul 25, 1965  Age: 56 y.o. MRN: [id number] ? ?CC: vaginal discharge, HTN f/u ? ? ?HPI ?Kirsten James presents with increased amounts of clear vaginal discharge x 1 month, some symptoms of burning .Denies dysuria, urgency, frequency. Does report a new sexual partner. Denies any rashes. She has been using monistat OTC. ? ?She was recently at an airbnb where she was in a hot tub with too much bromide and is unsure if this contributes to her current symptoms. She also reports a lot of mold at the airbnb and her ears feel full, sore throat, lesions on back of tongue.  ?presents for medication refill and believes she has a yeast infection with symptoms of burning and blisters on her tongue. ? ?Hypertension, follow-up ? ?BP Readings from Last 3 Encounters:  ?10/23/21 (!) 159/90  ?04/06/21 (!) 147/87  ?01/23/21 138/84  ? Wt Readings from Last 3 Encounters:  ?10/23/21 227 lb 14.4 oz (103.4 kg)  ?04/06/21 215 lb (97.5 kg)  ?01/23/21 227 lb (103 kg)  ?  ? ?She was last seen for hypertension 9 months ago.  ?BP at that visit was 134/84. Management since that visit includes continue diltiazem. ? ?She reports excellent compliance with treatment. ?She is not having side effects.  ?She is following a Regular diet. ?She is exercising. ?She does not smoke. ? ?Use of agents associated with hypertension: none.  ? ?Outside blood pressures are 120s/80s. ? ?Symptoms: ?No chest pain No chest pressure  ?No palpitations No syncope  ?No dyspnea Yes orthopnea  ?No paroxysmal nocturnal dyspnea No lower extremity edema  ? ?Pertinent labs ?Lab Results  ?Component Value Date  ? CHOL 225 (H) 01/20/2021  ? HDL 75  01/20/2021  ? LDLCALC 130 (H) 01/20/2021  ? TRIG 116 01/20/2021  ? CHOLHDL 3.0 01/20/2021  ? Lab Results  ?Component Value Date  ? NA 140 01/20/2021  ? K 4.7 01/20/2021  ? CREATININE 0.83 01/20/2021  ? EGFR 83 01/20/2021  ? GLUCOSE 89 01/20/2021  ? TSH 1.070 01/20/2021  ?  ? ?The 10-year ASCVD risk score (Arnett DK, et al., 2019) is: 3.5% ? ?---------------------------------------------------------------------------------------------------  ? ?Past Medical History:  ?Diagnosis Date  ? Allergy   ? Complication of anesthesia   ? hiccups   ? 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 L buttocks  ? Frequent urinary tract infections   ? Hypertension   ? ? ?Past Surgical History:  ?Procedure Laterality Date  ? BREAST BIOPSY Right 04/05/2020  ? [place] bx, Q shape marker 9:00,  INVASIVE MAMMARY CARCINOMA  ? BREAST BIOPSY Right 04/05/2020  ? [place] bx, x shape marker 1:00, INVASIVE MAMMARY CARCINOMA  ? BREAST RECONSTRUCTION WITH PLACEMENT OF TISSUE EXPANDER AND FLEX HD (ACELLULAR HYDRATED DERMIS) Right 04/25/2020  ? Procedure: BREAST RECONSTRUCTION WITH PLACEMENT OF TISSUE EXPANDER AND FLEX HD (ACELLULAR HYDRATED DERMIS);  Surgeon: [name], MD;  Location: [facility];  Service: Plastics;  Laterality: Right;  ? CHOLECYSTECTOMY    ? L4-5 herniated disc repair    ? LIPOSUCTION WITH LIPOFILLING Right 11/15/2020  ? Procedure: Right breast fat grafting and excess skin excision;  Surgeon: [name], MD;  Location: [place];  Service: Plastics;  Laterality: Right;  ?  MASTOPEXY Left 06/14/2020  ? Procedure: LEFT BREAST MASTOPEXY;  Surgeon: [name], MD;  Location: [place];  Service: Plastics;  Laterality: Left;  ? NASAL SINUS SURGERY  2005  ? PLANTAR FASCIA RELEASE    ? REMOVAL OF TISSUE EXPANDER AND PLACEMENT OF IMPLANT Right 06/14/2020  ? Procedure: REMOVAL OF TISSUE EXPANDER AND PLACEMENT OF IMPLANT;  Surgeon: [name], MD;  Location: [place];  Service: Plastics;   Laterality: Right;  ? SIMPLE MASTECTOMY WITH AXILLARY SENTINEL NODE BIOPSY Right 04/25/2020  ? Procedure: SIMPLE MASTECTOMY WITH AXILLARY SENTINEL NODE BIOPSY;  Surgeon: Ronny Bacon, MD;  Location: [facility];  Service: General;  Laterality: Right;  ? ? ?Family History  ?Problem Relation Age of Onset  ? Cancer Mother   ?     non-small cell lung cancer- smoker  ? Cancer Father   ?     oral & larynx- smoker  ? Melanoma Maternal Uncle   ? Alzheimer's disease Maternal Grandmother   ? Breast cancer Neg Hx   ? ? ?Social History  ? ?Socioeconomic History  ? Marital status: Married  ?  Spouse name: Not on file  ? Number of children: Not on file  ? Years of education: Not on file  ? Highest education level: Not on file  ?Occupational History  ? Not on file  ?Tobacco Use  ? Smoking status: Former  ?  Types: Cigarettes  ?  Quit date: 10/12/2019  ?  Years since quitting: 2.0  ? Smokeless tobacco: Never  ?Substance and Sexual Activity  ? Alcohol use: Never  ? Drug use: Never  ? Sexual activity: Not Currently  ?Other Topics Concern  ? Not on file  ?Social History Narrative  ? Quit smoking [teen- 1995; quit]; no alcohol. Lives in [place] by self; daughter in [place]. RN- Scientist, research (medical).   ? ?Social Determinants of Health  ? ?Financial Resource Strain: Not on file  ?Food Insecurity: Not on file  ?Transportation Needs: Not on file  ?Physical Activity: Not on file  ?Stress: Not on file  ?Social Connections: Not on file  ?Intimate Partner Violence: Not on file  ? ? ?Outpatient Medications Prior to Visit  ?Medication Sig Dispense Refill  ? Cholecalciferol (VITAMIN D3) 125 MCG (5000 UT) TABS Take 5,000 Units by mouth daily.    ? diltiazem (CARDIZEM CD) 240 MG 24 hr capsule TAKE 1 CAPSULE BY MOUTH EVERY DAY 90 capsule 0  ? Fexofenadine-Pseudoephedrine (ALLEGRA-D 24 HOUR PO) Take 180 mg by mouth as needed.    ? Multiple Vitamin (MULTIVITAMIN WITH MINERALS) TABS tablet Take 1 tablet by mouth daily.    ? fluticasone  (FLONASE) 50 MCG/ACT nasal spray Place 1 spray into both nostrils daily. 16 g 6  ? nitrofurantoin (MACRODANTIN) 50 MG capsule TAKE 1 CAPSULE DAILY AS NEEDED FOR URINARY TRACT INFECTION SYMPTOMS, AFTER SEXUAL INTERCOURSE AS DIRECTED 90 capsule 3  ? FIBER PO Take 1 capsule by mouth daily. (Patient not taking: Reported on 10/23/2021)    ? methylPREDNISolone (MEDROL DOSEPAK) 4 MG TBPK tablet 6 day dose pack - take as directed (Patient not taking: Reported on 10/23/2021) 21 tablet 0  ? Turmeric Curcumin 500 MG CAPS Take 500 mg by mouth daily.  (Patient not taking: Reported on 10/23/2021)    ? ?No facility-administered medications prior to visit.  ? ? ?Allergies  ?Allergen Reactions  ? Other   ?  Allergy to pollen and mold- patient reports watery eyes, runny nose  and sneezing as reaction  ? Penicillins Hives  ? Amoxicillin Hives  ? ? ?ROS ?Review of Systems  ?Constitutional:  Negative for fatigue and fever.  ?Respiratory:  Negative for cough and shortness of breath.   ?Cardiovascular:  Negative for chest pain and leg swelling.  ?Gastrointestinal:  Negative for abdominal pain.  ?Neurological:  Negative for dizziness and headaches.  ? ?  ?Objective:  ?  ?Physical Exam ?Constitutional:   ?   Appearance: Normal appearance. She is not ill-appearing.  ?HENT:  ?   Head: Normocephalic.  ?   Right Ear: Tympanic membrane normal.  ?   Left Ear: Tympanic membrane normal.  ?   Nose: No congestion or rhinorrhea.  ?   Mouth/Throat:  ?   Pharynx: Posterior oropharyngeal erythema present. No oropharyngeal exudate.  ?Eyes:  ?   Conjunctiva/sclera: Conjunctivae normal.  ?Cardiovascular:  ?   Rate and Rhythm: Normal rate and regular rhythm.  ?   Pulses: Normal pulses.  ?   Heart sounds: Normal heart sounds.  ?Pulmonary:  ?   Effort: Pulmonary effort is normal.  ?   Breath sounds: Normal breath sounds.  ?Musculoskeletal:  ?   Right lower leg: No edema.  ?   Left lower leg: No edema.  ?Neurological:  ?   Mental Status: She is oriented to person,  place, and time.  ?Psychiatric:     ?   Mood and Affect: Mood normal.     ?   Behavior: Behavior normal.  ? ? ?BP (!) 159/90 (BP Location: Left Arm, Patient Position: Sitting, Cuff Size: Large)   Pulse 93   Ht 5' 9"  (1.

## 2021-10-23 NOTE — Addendum Note (Signed)
Addended by: Barnie Mort on: 10/23/2021 03:01 PM ? ? Modules accepted: Orders ? ?

## 2021-10-24 LAB — CBC WITH DIFFERENTIAL/PLATELET
Basophils Absolute: 0.1 10*3/uL (ref 0.0–0.2)
Basos: 1 %
EOS (ABSOLUTE): 0.1 10*3/uL (ref 0.0–0.4)
Eos: 1 %
Hematocrit: 45.6 % (ref 34.0–46.6)
Hemoglobin: 15.2 g/dL (ref 11.1–15.9)
Immature Grans (Abs): 0 10*3/uL (ref 0.0–0.1)
Immature Granulocytes: 0 %
Lymphocytes Absolute: 2.4 10*3/uL (ref 0.7–3.1)
Lymphs: 28 %
MCH: 28.3 pg (ref 26.6–33.0)
MCHC: 33.3 g/dL (ref 31.5–35.7)
MCV: 85 fL (ref 79–97)
Monocytes Absolute: 0.5 10*3/uL (ref 0.1–0.9)
Monocytes: 6 %
Neutrophils Absolute: 5.4 10*3/uL (ref 1.4–7.0)
Neutrophils: 64 %
Platelets: 297 10*3/uL (ref 150–450)
RBC: 5.37 x10E6/uL — ABNORMAL HIGH (ref 3.77–5.28)
RDW: 14.3 % (ref 11.7–15.4)
WBC: 8.4 10*3/uL (ref 3.4–10.8)

## 2021-10-24 LAB — HEPATITIS C ANTIBODY: Hep C Virus Ab: NONREACTIVE

## 2021-10-24 LAB — COMPREHENSIVE METABOLIC PANEL
ALT: 19 IU/L (ref 0–32)
AST: 16 IU/L (ref 0–40)
Albumin/Globulin Ratio: 2.2 (ref 1.2–2.2)
Albumin: 4.9 g/dL (ref 3.8–4.9)
Alkaline Phosphatase: 108 IU/L (ref 44–121)
BUN/Creatinine Ratio: 13 (ref 9–23)
BUN: 11 mg/dL (ref 6–24)
Bilirubin Total: 0.4 mg/dL (ref 0.0–1.2)
CO2: 23 mmol/L (ref 20–29)
Calcium: 9.7 mg/dL (ref 8.7–10.2)
Chloride: 100 mmol/L (ref 96–106)
Creatinine, Ser: 0.87 mg/dL (ref 0.57–1.00)
Globulin, Total: 2.2 g/dL (ref 1.5–4.5)
Glucose: 86 mg/dL (ref 70–99)
Potassium: 4.2 mmol/L (ref 3.5–5.2)
Sodium: 141 mmol/L (ref 134–144)
Total Protein: 7.1 g/dL (ref 6.0–8.5)
eGFR: 79 mL/min/{1.73_m2} (ref >59)

## 2021-10-24 LAB — LIPID PANEL
Chol/HDL Ratio: 3.1 ratio (ref 0.0–4.4)
Cholesterol, Total: 203 mg/dL — ABNORMAL HIGH (ref 100–199)
HDL: 66 mg/dL (ref >39)
LDL Chol Calc (NIH): 117 mg/dL — ABNORMAL HIGH (ref 0–99)
Triglycerides: 111 mg/dL (ref 0–149)
VLDL Cholesterol Cal: 20 mg/dL (ref 5–40)

## 2021-10-24 LAB — HIV ANTIBODY (ROUTINE TESTING W REFLEX): HIV Screen 4th Generation wRfx: NONREACTIVE

## 2021-10-24 LAB — RPR: RPR Ser Ql: NONREACTIVE

## 2021-10-26 ENCOUNTER — Encounter: Payer: Self-pay | Admitting: Physician Assistant

## 2021-10-26 ENCOUNTER — Other Ambulatory Visit: Payer: Self-pay | Admitting: Family Medicine

## 2021-10-26 ENCOUNTER — Other Ambulatory Visit: Payer: Self-pay | Admitting: Physician Assistant

## 2021-10-26 DIAGNOSIS — N3000 Acute cystitis without hematuria: Secondary | ICD-10-CM

## 2021-10-26 DIAGNOSIS — B9689 Other specified bacterial agents as the cause of diseases classified elsewhere: Secondary | ICD-10-CM

## 2021-10-26 LAB — NUSWAB VAGINITIS PLUS (VG+)
Atopobium vaginae: HIGH Score — AB
BVAB 2: HIGH Score — AB
Candida albicans, NAA: NEGATIVE
Candida glabrata, NAA: NEGATIVE
Chlamydia trachomatis, NAA: NEGATIVE
Megasphaera 1: HIGH Score — AB
Neisseria gonorrhoeae, NAA: NEGATIVE
Trich vag by NAA: NEGATIVE

## 2021-10-26 MED ORDER — METRONIDAZOLE 500 MG PO TABS: 500.0000 mg | ORAL_TABLET | Freq: Two times a day (BID) | ORAL | 0 refills | Status: AC

## 2021-10-26 MED ORDER — NITROFURANTOIN MONOHYD MACRO 100 MG PO CAPS: 100.0000 mg | ORAL_CAPSULE | Freq: Two times a day (BID) | ORAL | 0 refills | Status: AC

## 2021-10-26 MED ORDER — NITROFURANTOIN MONOHYD MACRO 100 MG PO CAPS: 100.0000 mg | ORAL_CAPSULE | Freq: Two times a day (BID) | ORAL | 0 refills | Status: DC

## 2021-10-26 MED ORDER — METRONIDAZOLE 500 MG PO TABS: 500.0000 mg | ORAL_TABLET | Freq: Two times a day (BID) | ORAL | 0 refills | Status: DC

## 2021-10-27 LAB — SPECIMEN STATUS REPORT

## 2021-10-27 LAB — URINE CULTURE: Organism ID, Bacteria: NO GROWTH

## 2021-10-27 NOTE — Telephone Encounter (Signed)
Provider sent rx

## 2021-11-10 ENCOUNTER — Other Ambulatory Visit: Payer: Self-pay | Admitting: Physician Assistant

## 2021-11-10 DIAGNOSIS — I10 Essential (primary) hypertension: Secondary | ICD-10-CM

## 2021-11-10 NOTE — Telephone Encounter (Signed)
Requested medication (s) are due for refill today:   No ? ?Requested medication (s) are on the active medication list:   Yes ? ?Future visit scheduled:   Yes in 2 mo. ? ? ?Last ordered: 10/18/2021 #90, 0 refills ? ?Returned because pharmacy requesting a 90 day supply and a DX Code  ? ?Requested Prescriptions  ?Pending Prescriptions Disp Refills  ? diltiazem (CARDIZEM CD) 240 MG 24 hr capsule [Pharmacy Med Name: DILTIAZEM 24H ER(CD) 240 MG CP] 90 capsule 1  ?  Sig: TAKE 1 CAPSULE BY MOUTH EVERY DAY  ?  ? Cardiovascular: Calcium Channel Blockers 3 Failed - 11/10/2021 12:30 PM  ?  ?  Failed - Last BP in normal range  ?  BP Readings from Last 1 Encounters:  ?10/23/21 (!) 159/90  ?  ?  ?  ?  Passed - ALT in normal range and within 360 days  ?  ALT  ?Date Value Ref Range Status  ?10/23/2021 19 0 - 32 IU/L Final  ?  ?  ?  ?  Passed - AST in normal range and within 360 days  ?  AST  ?Date Value Ref Range Status  ?10/23/2021 16 0 - 40 IU/L Final  ?  ?  ?  ?  Passed - Cr in normal range and within 360 days  ?  Creatinine, Ser  ?Date Value Ref Range Status  ?10/23/2021 0.87 0.57 - 1.00 mg/dL Final  ?  ?  ?  ?  Passed - Last Heart Rate in normal range  ?  Pulse Readings from Last 1 Encounters:  ?10/23/21 93  ?  ?  ?  ?  Passed - Valid encounter within last 6 months  ?  Recent Outpatient Visits   ? ?      ? 2 weeks ago Vaginal discharge  ? [facility] Thedore Mins, Ria [place], PA-C  ? 9 months ago Annual physical exam  ? [place], PA-C  ? 1 year ago Screening for cervical cancer  ? [facility] Flinchum, Kelby Aline, FNP  ? 1 year ago Essential hypertension  ? [place], FNP  ? 2 years ago Body mass index (BMI) of 30.0-30.9 in adult  ? [place], FNP  ? ?  ?  ?Future Appointments   ? ?        ? In 2 months Drubel, Delman [place], [facility]  ? ?  ? ? ?  ?  ?  ? ?

## 2021-11-22 ENCOUNTER — Other Ambulatory Visit: Payer: Self-pay | Admitting: Physician Assistant

## 2021-11-22 DIAGNOSIS — I10 Essential (primary) hypertension: Secondary | ICD-10-CM

## 2021-11-22 MED ORDER — DILTIAZEM HCL ER COATED BEADS 240 MG PO CP24: 240.0000 mg | ORAL_CAPSULE | Freq: Every day | ORAL | 0 refills | Status: DC

## 2021-11-22 NOTE — Telephone Encounter (Signed)
Resending to another pharmacy. ? ?Requested Prescriptions  ?Pending Prescriptions Disp Refills  ?? diltiazem (CARDIZEM CD) 240 MG 24 hr capsule 90 capsule 0  ?  Sig: Take 1 capsule (240 mg total) by mouth daily.  ?  ? Cardiovascular: Calcium Channel Blockers 3 Failed - 11/22/2021  1:58 PM  ?  ?  Failed - Last BP in normal range  ?  BP Readings from Last 1 Encounters:  ?10/23/21 (!) 159/90  ?   ?  ?  Passed - ALT in normal range and within 360 days  ?  ALT  ?Date Value Ref Range Status  ?10/23/2021 19 0 - 32 IU/L Final  ?   ?  ?  Passed - AST in normal range and within 360 days  ?  AST  ?Date Value Ref Range Status  ?10/23/2021 16 0 - 40 IU/L Final  ?   ?  ?  Passed - Cr in normal range and within 360 days  ?  Creatinine, Ser  ?Date Value Ref Range Status  ?10/23/2021 0.87 0.57 - 1.00 mg/dL Final  ?   ?  ?  Passed - Last Heart Rate in normal range  ?  Pulse Readings from Last 1 Encounters:  ?10/23/21 93  ?   ?  ?  Passed - Valid encounter within last 6 months  ?  Recent Outpatient Visits   ?      ? 1 month ago Vaginal discharge  ? [facility] Mikey Kirschner, PA-C  ? 10 months ago Annual physical exam  ? [place], PA-C  ? 1 year ago Screening for cervical cancer  ? [facility] Flinchum, Kelby Aline, FNP  ? 1 year ago Essential hypertension  ? [place], FNP  ? 2 years ago Body mass index (BMI) of 30.0-30.9 in adult  ? [place], FNP  ?  ?  ?Future Appointments   ?        ? In 2 months Drubel, Delman [place] [facility], [facility]  ?  ? ?  ?  ?  ? ?

## 2021-11-22 NOTE — Telephone Encounter (Signed)
Medication Refill - Medication:  ?diltiazem (CARDIZEM CD) 240 MG 24 hr capsule  ? ?Has the patient contacted their pharmacy? Yes.   ?Contact PCP ? ?*Medication was [place] to [place] pharmacy in [place] state, pt is now needing it sent to a pharmacy here* ? ?Preferred Pharmacy (with phone number or street name):  ?[id number] [place], [place] - [address]  ?Phone:  [phone]?Fax:  [phone]? ? ? ?Has the patient been seen for an appointment in the last year OR does the patient have an upcoming appointment? Yes.   ? ?Agent: Please be advised that RX refills may take up to 3 business days. We ask that you follow-up with your pharmacy. ?

## 2021-12-21 ENCOUNTER — Other Ambulatory Visit: Payer: Self-pay | Admitting: Physician Assistant

## 2021-12-21 DIAGNOSIS — I10 Essential (primary) hypertension: Secondary | ICD-10-CM

## 2021-12-22 MED ORDER — DILTIAZEM HCL ER COATED BEADS 240 MG PO CP24: 240.0000 mg | ORAL_CAPSULE | Freq: Every day | ORAL | 0 refills | Status: DC

## 2022-01-24 ENCOUNTER — Ambulatory Visit (INDEPENDENT_AMBULATORY_CARE_PROVIDER_SITE_OTHER): Payer: 59 | Admitting: Physician Assistant

## 2022-01-24 ENCOUNTER — Encounter: Payer: Self-pay | Admitting: Physician Assistant

## 2022-01-24 VITALS — BP 141/94 | HR 77 | Ht 69.0 in | Wt 225.2 lb

## 2022-01-24 DIAGNOSIS — Z23 Encounter for immunization: Secondary | ICD-10-CM

## 2022-01-24 DIAGNOSIS — N390 Urinary tract infection, site not specified: Secondary | ICD-10-CM | POA: Diagnosis not present

## 2022-01-24 DIAGNOSIS — I10 Essential (primary) hypertension: Secondary | ICD-10-CM

## 2022-01-24 DIAGNOSIS — R300 Dysuria: Secondary | ICD-10-CM | POA: Diagnosis not present

## 2022-01-24 DIAGNOSIS — M2550 Pain in unspecified joint: Secondary | ICD-10-CM

## 2022-01-24 DIAGNOSIS — Z1211 Encounter for screening for malignant neoplasm of colon: Secondary | ICD-10-CM

## 2022-01-24 HISTORY — DX: Pain in unspecified joint: M25.50

## 2022-01-24 LAB — POCT URINALYSIS DIPSTICK
Bilirubin, UA: NEGATIVE
Glucose, UA: NEGATIVE
Ketones, UA: NEGATIVE
Leukocytes, UA: NEGATIVE
Nitrite, UA: NEGATIVE
Protein, UA: NEGATIVE
Spec Grav, UA: <=1.005 — AB (ref 1.010–1.025)
Urobilinogen, UA: 0.2 E.U./dL
pH, UA: 6 (ref 5.0–8.0)

## 2022-01-24 MED ORDER — NITROFURANTOIN MACROCRYSTAL 50 MG PO CAPS
ORAL_CAPSULE | ORAL | 1 refills | Status: DC
Start: 2022-01-24 — End: 2022-06-12

## 2022-01-24 MED ORDER — CELECOXIB 100 MG PO CAPS: 100.0000 mg | ORAL_CAPSULE | Freq: Two times a day (BID) | ORAL | 0 refills | Status: DC

## 2022-01-24 NOTE — Assessment & Plan Note (Addendum)
Manages with nitrofurantoin 50 mg prn intercourse. Refilled. Advised increase fluids, can try d mannose. Advised she discuss vaginal estrogen possiblity with her oncologist. UA today + blood but - leuk, nitrites. Pt declines culture

## 2022-01-24 NOTE — Progress Notes (Signed)
I,Sha'taria Tyson,acting as a Education administrator for [employer], PA-C.,have documented all relevant documentation on the behalf of Mikey Kirschner, PA-C,as directed by  Mikey Kirschner, PA-C while in the presence of Mikey Kirschner, PA-C.   Established patient visit   Patient: Kirsten James   DOB: Mar 23, 1966   56 y.o. Female  MRN: [id number] Visit Date: 01/24/2022  Today's healthcare provider: Mikey Kirschner, PA-C   Cc. Htn f/u  Subjective    HPI  Report urinary frequency, urgency, burning x 5 days ago. She takes nitrofurantoin 50 mg prn intercourse, she started taking 100 mg bid x 5 days. Reports her symptoms have improved but admits to vaginal irritation. Reports vaginal dryness.    Hypertension, follow-up  BP Readings from Last 3 Encounters:  01/24/22 (!) 141/94  10/23/21 (!) 159/90  04/06/21 (!) 147/87   Wt Readings from Last 3 Encounters:  01/24/22 225 lb 3.2 oz (102.2 kg)  10/23/21 227 lb 14.4 oz (103.4 kg)  04/06/21 215 lb (97.5 kg)     She was last seen for hypertension 3 months ago.  BP at that visit was 159/90. Management since that visit includes contine current medications and monitor at home.  She reports excellent compliance with treatment. She is not having side effects.  She is following a  high salt  diet. She is exercising. She does not smoke.  Use of agents associated with hypertension:  ibuprofen 60 mg as needed .   Outside blood pressures are high 140s/high 80s: Symptoms: No chest pain No chest pressure  No palpitations No syncope  No dyspnea No orthopnea  No paroxysmal nocturnal dyspnea No lower extremity edema   Pertinent labs Lab Results  Component Value Date   CHOL 203 (H) 10/23/2021   HDL 66 10/23/2021   LDLCALC 117 (H) 10/23/2021   TRIG 111 10/23/2021   CHOLHDL 3.1 10/23/2021   Lab Results  Component Value Date   NA 141 10/23/2021   K 4.2 10/23/2021   CREATININE 0.87 10/23/2021   EGFR 79 10/23/2021   GLUCOSE 86 10/23/2021   TSH  1.070 01/20/2021     The 10-year ASCVD risk score (Arnett DK, et al., 2019) is: 3.1%  ---------------------------------------------------------------------------------------------------   Medications: Outpatient Medications Prior to Visit  Medication Sig   Cholecalciferol (VITAMIN D3) 125 MCG (5000 UT) TABS Take 5,000 Units by mouth daily.   diltiazem (CARDIZEM CD) 240 MG 24 hr capsule Take 1 capsule (240 mg total) by mouth daily.   Fexofenadine-Pseudoephedrine (ALLEGRA-D 24 HOUR PO) Take 180 mg by mouth as needed.   fluticasone (FLONASE) 50 MCG/ACT nasal spray Place 1 spray into both nostrils daily.   Multiple Vitamin (MULTIVITAMIN WITH MINERALS) TABS tablet Take 1 tablet by mouth daily.   Turmeric (QC TUMERIC COMPLEX) 500 MG CAPS    [DISCONTINUED] ibuprofen (ADVIL) 600 MG tablet    [DISCONTINUED] nitrofurantoin (MACRODANTIN) 50 MG capsule    Magnesium 100 MG CAPS  (Patient not taking: Reported on 01/24/2022)   No facility-administered medications prior to visit.    Review of Systems  Constitutional:  Negative for fatigue and fever.  Respiratory:  Negative for cough and shortness of breath.   Cardiovascular:  Negative for chest pain and leg swelling.  Gastrointestinal:  Negative for abdominal pain.  Genitourinary:  Positive for dysuria.  Neurological:  Negative for dizziness and headaches.       Objective    BP (!) 141/94   Pulse 77   Ht 5' 9"  (1.753 m)   Wt  225 lb 3.2 oz (102.2 kg)   LMP 11/14/2018 (Exact Date)   SpO2 100%   BMI 33.26 kg/m  Blood pressure (!) 141/94, pulse 77, height 5' 9"  (1.753 m), weight 225 lb 3.2 oz (102.2 kg), last menstrual period 11/14/2018, SpO2 100 %.   Physical Exam Constitutional:      General: She is awake.     Appearance: She is well-developed.  HENT:     Head: Normocephalic.  Eyes:     Conjunctiva/sclera: Conjunctivae normal.  Cardiovascular:     Rate and Rhythm: Normal rate and regular rhythm.     Heart sounds: Normal heart  sounds.  Pulmonary:     Effort: Pulmonary effort is normal.     Breath sounds: Normal breath sounds.  Skin:    General: Skin is warm.  Neurological:     Mental Status: She is alert and oriented to person, place, and time.  Psychiatric:        Attention and Perception: Attention normal.        Mood and Affect: Mood normal.        Speech: Speech normal.        Behavior: Behavior is cooperative.      Results for orders placed or performed in visit on 01/24/22  POCT Urinalysis Dipstick  Result Value Ref Range   Color, UA     Clarity, UA     Glucose, UA Negative Negative   Bilirubin, UA negative    Ketones, UA negative    Spec Grav, UA <=1.005 (A) 1.010 - 1.025   Blood, UA trace    pH, UA 6.0 5.0 - 8.0   Protein, UA Negative Negative   Urobilinogen, UA 0.2 0.2 or 1.0 E.U./dL   Nitrite, UA negative    Leukocytes, UA Negative Negative   Appearance     Odor      Assessment & Plan     Problem List Items Addressed This Visit       Cardiovascular and Mediastinum   Essential hypertension - Primary    Elevated in office and at home . Would like to add losartan 25 mg, pt declines. Advised dash/low salt diet. Advised increase in exercise.  Reviewed last cmp F/u 6 mo        Genitourinary   Chronic UTI    Manages with nitrofurantoin 50 mg prn intercourse. Refilled. Advised increase fluids, can try d mannose. Advised she discuss vaginal estrogen possiblity with her oncologist. UA today + blood but - leuk, nitrites. Pt declines culture       Relevant Medications   nitrofurantoin (MACRODANTIN) 50 MG capsule     Other   Arthralgia    Pt currently taking ibuprofen 600 mg multiple times a day Advised she d/c and switch for celebrex 100 mg bid.  Pt states mobic caused memory loss.       Relevant Medications   celecoxib (CELEBREX) 100 MG capsule   Other Visit Diagnoses     Dysuria       Relevant Orders   POCT Urinalysis Dipstick (Completed)   Colon cancer screening        Relevant Orders   Cologuard   Need for shingles vaccine       Relevant Orders   Varicella-zoster vaccine IM (Shingrix)        Return in about 6 months (around 07/27/2022) for hypertension, CPE.      I, Mikey Kirschner, PA-C have reviewed all documentation for this visit. The documentation on  01/24/2022  for the exam, diagnosis, procedures, and orders are all accurate and complete.  Mikey Kirschner, PA-C [facility] [address] [place], [place], [address] Office: [phone] Fax: [place]

## 2022-01-24 NOTE — Assessment & Plan Note (Signed)
Pt currently taking ibuprofen 600 mg multiple times a day Advised she d/c and switch for celebrex 100 mg bid.  Pt states mobic caused memory loss.

## 2022-01-24 NOTE — Assessment & Plan Note (Signed)
Elevated in office and at home . Would like to add losartan 25 mg, pt declines. Advised dash/low salt diet. Advised increase in exercise.  Reviewed last cmp F/u 6 mo

## 2022-01-24 NOTE — Patient Instructions (Signed)
Please contact [phone] to schedule your mammogram. They will ask which location you prefer to be seen in. You have two options listed below.  1) [facility] located at [address] [place], [place] [address] 2) [facility] [place] located at [address] [place], [place] [address]  Please feel free to contact us if you have any further questions or concerns  

## 2022-02-12 ENCOUNTER — Ambulatory Visit: Payer: 59 | Admitting: Dermatology

## 2022-02-12 DIAGNOSIS — D229 Melanocytic nevi, unspecified: Secondary | ICD-10-CM

## 2022-02-12 DIAGNOSIS — D1801 Hemangioma of skin and subcutaneous tissue: Secondary | ICD-10-CM | POA: Diagnosis not present

## 2022-02-12 DIAGNOSIS — Q825 Congenital non-neoplastic nevus: Secondary | ICD-10-CM | POA: Diagnosis not present

## 2022-02-12 NOTE — Patient Instructions (Signed)
Due to recent changes in healthcare laws, you may see results of your pathology and/or laboratory studies on MyChart before the doctors have had a chance to review them. We understand that in some cases there may be results that are confusing or concerning to you. Please understand that not all results are received at the same time and often the doctors may need to interpret multiple results in order to provide you with the best plan of care or course of treatment. Therefore, we ask that you please give us 2 business days to thoroughly review all your results before contacting the office for clarification. Should we see a critical lab result, you will be contacted sooner.   If You Need Anything After Your Visit  If you have any questions or concerns for your doctor, please call our main line at [phone] and press option 4 to reach your doctor's medical assistant. If no one answers, please leave a voicemail as directed and we will return your call as soon as possible. Messages left after 4 pm will be answered the following business day.   You may also send us a message via MyChart. We typically respond to MyChart messages within 1-2 business days.  For prescription refills, please ask your pharmacy to contact our office. Our fax number is [phone].  If you have an urgent issue when the clinic is closed that cannot wait until the next business day, you can page your doctor at the number below.    Please note that while we do our best to be available for urgent issues outside of office hours, we are not available 24/7.   If you have an urgent issue and are unable to reach us, you may choose to seek medical care at your doctor's office, retail clinic, urgent care center, or emergency room.  If you have a medical emergency, please immediately call 911 or go to the emergency department.  Pager Numbers  - Dr. Kowalski: [phone]  - Dr. Moye: [phone]  - Dr. Stewart:  [phone]  In the event of inclement weather, please call our main line at [phone] for an update on the status of any delays or closures.  Dermatology Medication Tips: Please keep the boxes that topical medications come in in order to help keep track of the instructions about where and how to use these. Pharmacies typically print the medication instructions only on the boxes and not directly on the medication tubes.   If your medication is too expensive, please contact our office at [phone] option 4 or send us a message through MyChart.   We are unable to tell what your co-pay for medications will be in advance as this is different depending on your insurance coverage. However, we may be able to find a substitute medication at lower cost or fill out paperwork to get insurance to cover a needed medication.   If a prior authorization is required to get your medication covered by your insurance company, please allow us 1-2 business days to complete this process.  Drug prices often vary depending on where the prescription is filled and some pharmacies may offer cheaper prices.  The website www.goodrx.com contains coupons for medications through different pharmacies. The prices here do not account for what the cost may be with help from insurance (it may be cheaper with your insurance), but the website can give you the price if you did not use any insurance.  - You can print the associated coupon and take it with   your prescription to the pharmacy.  - You may also stop by our office during regular business hours and pick up a GoodRx coupon card.  - If you need your prescription sent electronically to a different pharmacy, notify our office through [facility] MyChart or by phone at [phone] option 4.     Si Usted Necesita Algo Despus de Su Visita  Tambin puede enviarnos un mensaje a travs de MyChart. Por lo general respondemos a los mensajes de MyChart en el transcurso de 1 a 2  das hbiles.  Para renovar recetas, por favor pida a su farmacia que se ponga en contacto con nuestra oficina. Nuestro nmero de fax es el [phone].  Si tiene un asunto urgente cuando la clnica est cerrada y que no puede esperar hasta el siguiente da hbil, puede llamar/localizar a su doctor(a) al nmero que aparece a continuacin.   Por favor, tenga en cuenta que aunque hacemos todo lo posible para estar disponibles para asuntos urgentes fuera del horario de oficina, no estamos disponibles las 24 horas del da, los 7 das de la semana.   Si tiene un problema urgente y no puede comunicarse con nosotros, puede optar por buscar atencin mdica  en el consultorio de su doctor(a), en una clnica privada, en un centro de atencin urgente o en una sala de emergencias.  Si tiene una emergencia mdica, por favor llame inmediatamente al 911 o vaya a la sala de emergencias.  Nmeros de bper  - Dr. Kowalski: [phone]  - Dra. Moye: [phone]  - Dra. Stewart: [phone]  En caso de inclemencias del tiempo, por favor llame a nuestra lnea principal al [phone] para una actualizacin sobre el estado de cualquier retraso o cierre.  Consejos para la medicacin en dermatologa: Por favor, guarde las cajas en las que vienen los medicamentos de uso tpico para ayudarle a seguir las instrucciones sobre dnde y cmo usarlos. Las farmacias generalmente imprimen las instrucciones del medicamento slo en las cajas y no directamente en los tubos del medicamento.   Si su medicamento es muy caro, por favor, pngase en contacto con nuestra oficina llamando al [phone] y presione la opcin 4 o envenos un mensaje a travs de MyChart.   No podemos decirle cul ser su copago por los medicamentos por adelantado ya que esto es diferente dependiendo de la cobertura de su seguro. Sin embargo, es posible que podamos encontrar un medicamento sustituto a menor costo o llenar un formulario para que el  seguro cubra el medicamento que se considera necesario.   Si se requiere una autorizacin previa para que su compaa de seguros cubra su medicamento, por favor permtanos de 1 a 2 das hbiles para completar este proceso.  Los precios de los medicamentos varan con frecuencia dependiendo del lugar de dnde se surte la receta y alguna farmacias pueden ofrecer precios ms baratos.  El sitio web www.goodrx.com tiene cupones para medicamentos de diferentes farmacias. Los precios aqu no tienen en cuenta lo que podra costar con la ayuda del seguro (puede ser ms barato con su seguro), pero el sitio web puede darle el precio si no utiliz ningn seguro.  - Puede imprimir el cupn correspondiente y llevarlo con su receta a la farmacia.  - Tambin puede pasar por nuestra oficina durante el horario de atencin regular y recoger una tarjeta de cupones de GoodRx.  - Si necesita que su receta se enve electrnicamente a una farmacia diferente, informe a nuestra oficina a travs de MyChart de [facility]   o por telfono llamando al [phone] y presione la opcin 4.  

## 2022-02-12 NOTE — Progress Notes (Signed)
   Follow-Up Visit   Subjective  Kirsten James is a 56 y.o. female who presents for the following: check spot (Nasal tip, pt noticed last week, no symptoms, pt has used antifungal cream/L side and back, no symptoms).   The following portions of the chart were reviewed this encounter and updated as appropriate:       Review of Systems:  No other skin or systemic complaints except as noted in HPI or Assessment and Plan.  Objective  Well appearing patient in no apparent distress; mood and affect are within normal limits.  A focused examination was performed including face. Relevant physical exam findings are noted in the Assessment and Plan.  R nasal tip Well-demarcated 5.29m hypopigmented macule with central blanching pink macules x 2, no induration, no scale       Assessment & Plan   Hemangiomas - Red papules - Discussed benign nature - Observe - Call for any changes  - trunk  Melanocytic Nevi - Tan-brown and/or pink-flesh-colored symmetric macules and papules - Benign appearing on exam today - Observation - Call clinic for new or changing moles - Recommend daily use of broad spectrum spf 30+ sunscreen to sun-exposed areas.   - L upper back pink flesh pap  Nevus anemicus R nasal tip  Vrs other.  Benign-appearing.  Observation.  Call clinic for new or changing lesions.  Recommend daily use of broad spectrum spf 30+ sunscreen to sun-exposed areas.    RTC if any changes noted for recheck  Recommend daily broad spectrum sunscreen SPF 30+ to sun-exposed areas, reapply every 2 hours as needed. Call for new or changing lesions.  Staying in the shade or wearing long sleeves, sun glasses (UVA+UVB protection) and wide brim hats (4-inch brim around the entire circumference of the hat) are also recommended for sun protection.     Return if symptoms worsen or fail to improve.  I, SOthelia Pulling RMA, am acting as scribe for TBrendolyn Patty MD .  Documentation: I have  reviewed the above documentation for accuracy and completeness, and I agree with the above.  TBrendolyn PattyMD

## 2022-02-22 DIAGNOSIS — Z1211 Encounter for screening for malignant neoplasm of colon: Secondary | ICD-10-CM | POA: Diagnosis not present

## 2022-03-04 LAB — COLOGUARD: COLOGUARD: NEGATIVE

## 2022-04-11 ENCOUNTER — Ambulatory Visit
Admission: RE | Admit: 2022-04-11 | Discharge: 2022-04-11 | Disposition: A | Discharge status: Home / Self Care | Payer: 59 | Source: Ambulatory Visit | Attending: Physician Assistant | Admitting: Physician Assistant

## 2022-04-11 DIAGNOSIS — Z1231 Encounter for screening mammogram for malignant neoplasm of breast: Secondary | ICD-10-CM | POA: Diagnosis not present

## 2022-04-11 DIAGNOSIS — Z1239 Encounter for other screening for malignant neoplasm of breast: Secondary | ICD-10-CM | POA: Insufficient documentation

## 2022-04-30 DIAGNOSIS — M5416 Radiculopathy, lumbar region: Secondary | ICD-10-CM | POA: Insufficient documentation

## 2022-04-30 DIAGNOSIS — M5127 Other intervertebral disc displacement, lumbosacral region: Secondary | ICD-10-CM | POA: Insufficient documentation

## 2022-04-30 HISTORY — DX: Other intervertebral disc displacement, lumbosacral region: M51.27

## 2022-04-30 HISTORY — DX: Radiculopathy, lumbar region: M54.16

## 2022-05-14 ENCOUNTER — Telehealth: Payer: Self-pay

## 2022-05-14 NOTE — Telephone Encounter (Signed)
Kirsten James called to make a new patient appointment. Appointment has been scheduled.

## 2022-06-12 ENCOUNTER — Encounter: Payer: Self-pay | Admitting: Nurse Practitioner

## 2022-06-12 ENCOUNTER — Other Ambulatory Visit: Payer: Self-pay | Admitting: Physician Assistant

## 2022-06-12 ENCOUNTER — Ambulatory Visit (INDEPENDENT_AMBULATORY_CARE_PROVIDER_SITE_OTHER): Payer: 59 | Admitting: Nurse Practitioner

## 2022-06-12 ENCOUNTER — Other Ambulatory Visit: Payer: Self-pay | Admitting: Nurse Practitioner

## 2022-06-12 VITALS — BP 136/82 | HR 86 | Temp 97.2°F | Ht 69.0 in | Wt 232.0 lb

## 2022-06-12 DIAGNOSIS — E559 Vitamin D deficiency, unspecified: Secondary | ICD-10-CM

## 2022-06-12 DIAGNOSIS — Z716 Tobacco abuse counseling: Secondary | ICD-10-CM

## 2022-06-12 DIAGNOSIS — R69 Illness, unspecified: Secondary | ICD-10-CM | POA: Diagnosis not present

## 2022-06-12 DIAGNOSIS — J309 Allergic rhinitis, unspecified: Secondary | ICD-10-CM

## 2022-06-12 DIAGNOSIS — I10 Essential (primary) hypertension: Secondary | ICD-10-CM

## 2022-06-12 DIAGNOSIS — Z7689 Persons encountering health services in other specified circumstances: Secondary | ICD-10-CM | POA: Diagnosis not present

## 2022-06-12 DIAGNOSIS — F17290 Nicotine dependence, other tobacco product, uncomplicated: Secondary | ICD-10-CM

## 2022-06-12 MED ORDER — VITAMIN D (ERGOCALCIFEROL) 1.25 MG (50000 UNIT) PO CAPS: 50000.0000 [IU] | ORAL_CAPSULE | ORAL | 3 refills | Status: DC

## 2022-06-12 MED ORDER — FLUTICASONE PROPIONATE 50 MCG/ACT NA SUSP: 1.0000 | Freq: Every day | NASAL | 6 refills | Status: DC

## 2022-06-12 MED ORDER — DILTIAZEM HCL ER COATED BEADS 240 MG PO CP24: 240.0000 mg | ORAL_CAPSULE | Freq: Every day | ORAL | 1 refills | Status: DC

## 2022-06-12 MED ORDER — BUPROPION HCL ER (XL) 150 MG PO TB24: 150.0000 mg | ORAL_TABLET | Freq: Every day | ORAL | 2 refills | Status: DC

## 2022-06-12 NOTE — Patient Instructions (Addendum)
Begin Wellbutrin 150 mg daily for smoking cessation Begin Vitamin D 50, 000 U weekly Vitamin D rich diet Follow-up in 54-month, fasting   When you have gastroesophageal reflux disease (GERD), the foods you eat and your eating habits are very important. Choosing the right foods can help ease your discomfort. Think about working with a food expert (dietitian) to help you make good choices. What are tips for following this plan? Reading food labels Look for foods that are low in saturated fat. Foods that may help with your symptoms include: Foods that have less than 5% of daily value (DV) of fat. Foods that have 0 grams of trans fat. Cooking Do not fry your food. Cook your food by baking, steaming, grilling, or broiling. These are all methods that do not need a lot of fat for cooking. To add flavor, try to use herbs that are low in spice and acidity. Meal planning  Choose healthy foods that are low in fat, such as: Fruits and vegetables. Whole grains. Low-fat dairy products. Lean meats, fish, and poultry. Eat small meals often instead of eating 3 large meals each day. Eat your meals slowly in a place where you are relaxed. Avoid bending over or lying down until 2-3 hours after eating. Limit high-fat foods such as fatty meats or fried foods. Limit your intake of fatty foods, such as oils, butter, and shortening. Avoid the following as told by your doctor: Foods that cause symptoms. These may be different for different people. Keep a food diary to keep track of foods that cause symptoms. Alcohol. Drinking a lot of liquid with meals. Eating meals during the 2-3 hours before bed. Lifestyle Stay at a healthy weight. Ask your doctor what weight is healthy for you. If you need to lose weight, work with your doctor to do so safely. Exercise for at least 30 minutes on 5 or more days each week, or as told by your doctor. Wear loose-fitting clothes. Do not smoke or use any products that contain  nicotine or tobacco. If you need help quitting, ask your doctor. Sleep with the head of your bed higher than your feet. Use a wedge under the mattress or blocks under the bed frame to raise the head of the bed. Chew sugar-free gum after meals. What foods should eat?  Eat a healthy, well-balanced diet of fruits, vegetables, whole grains, low-fat dairy products, lean meats, fish, and poultry. Each person is different. Foods that may cause symptoms in one person may not cause any symptoms in another person. Work with your doctor to find foods that are safe for you. The items listed above may not be a complete list of what you can eat and drink. Contact a food expert for more options. What foods should I avoid? Limiting some of these foods may help in managing the symptoms of GERD. Everyone is different. Talk with a food expert or your doctor to help you find the exact foods to avoid, if any. Fruits Any fruits prepared with added fat. Any fruits that cause symptoms. For some people, this may include citrus fruits, such as oranges, grapefruit, pineapple, and lemons. Vegetables Deep-fried vegetables. [place]. Any vegetables prepared with added fat. Any vegetables that cause symptoms. For some people, this may include tomatoes and tomato products, chili peppers, onions and garlic, and horseradish. Grains Pastries or quick breads with added fat. Meats and other proteins High-fat meats, such as fatty beef or pork, hot dogs, ribs, ham, sausage, salami, and bacon. [place]  meat or protein, including fried fish and fried chicken. Nuts and nut butters, in large amounts. Dairy Whole milk and chocolate milk. Sour cream. Cream. Ice cream. Cream cheese. Milkshakes. Fats and oils Butter. Margarine. Shortening. Ghee. Beverages Coffee and tea, with or without caffeine. Carbonated beverages. Sodas. Energy drinks. Fruit juice made with acidic fruits, such as orange or grapefruit. Tomato juice. Alcoholic  drinks. Sweets and desserts Chocolate and cocoa. Donuts. Seasonings and condiments Pepper. Peppermint and spearmint. Added salt. Any condiments, herbs, or seasonings that cause symptoms. For some people, this may include curry, hot sauce, or vinegar-based salad dressings. The items listed above may not be a complete list of what you should not eat and drink. Contact a food expert for more options. Questions to ask your doctor Diet and lifestyle changes are often the first steps that are taken to manage symptoms of GERD. If diet and lifestyle changes do not help, talk with your doctor about taking medicines. Where to find more information International Foundation for Gastrointestinal Disorders: aboutgerd.org Summary When you have GERD, food and lifestyle choices are very important in easing your symptoms. Eat small meals often instead of 3 large meals a day. Eat your meals slowly and in a place where you are relaxed. Avoid bending over or lying down until 2-3 hours after eating. Limit high-fat foods such as fatty meats or fried foods. This information is not intended to replace advice given to you by your health care provider. Make sure you discuss any questions you have with your health care provider. Document Revised: 01/11/2020 Document Reviewed: 01/11/2020 Elsevier Patient Education  [place].  Preventing Vitamin D Deficiency Vitamin D deficiency is when your body does not have enough vitamin D. Vitamin D is important because it helps your body maintain calcium and phosphorus levels. It plays a key role in the health of bones and teeth, reduces inflammation, and improves the body's defense system (immune system). Our bodies make vitamin D when our skin is exposed to direct sunlight. However, for many people, this may not be enough vitamin D to meet the body's needs. How can this condition affect me? If vitamin D deficiency is severe, it can cause a condition in which a person's  bones become softer than normal. In adults, this condition is called osteomalacia. In children, this condition is called rickets. Vitamin D deficiency can also cause weak or thin bones (osteoporosis) in adults. What can increase my risk? You may be at risk for a vitamin D deficiency if you: Are pregnant. Are obese. Are an older adult. Have dark skin. Take certain medicines that affect the way vitamin D is absorbed. Have had a surgery in which a part of the stomach or a part of the small intestine was removed. Other risk factors include: Having a condition that limits your ability to absorb fat, such as Crohn's disease, long-term (chronic) pancreatitis, or cystic fibrosis. Having certain conditions that are passed from parent to child (inherited). Not having access to foods rich in vitamin D. Having limited ability to move and go outside safely. Living in areas that have fewer hours of sunlight. Spending most of your day indoors, or covering your skin all the time when you are outdoors. What actions can I take to reduce my risk of a vitamin D deficiency? Knowing the best sources of vitamin D You can meet your daily vitamin D needs from: Foods. Dietary supplements. Direct exposure to natural sunlight. Infant formula, for infants. Knowing how much vitamin D  you need General recommendations for daily vitamin D intake vary by these categories: Infants: 400 international units (IU). Children older than 1 year: 600 international units. Adults: 600 international units. Pregnant and breastfeeding women: 600 international units. Adults older than 70 years: 800 international units. These are minimum levels of recommended amounts. Your health care provider may recommend a different amount of vitamin D intake based on your specific needs and your overall health. Getting sun exposure Get regular, safe exposure to natural sunlight. Expose your skin to direct sunlight for at least 15 minutes every  day. If you have dark skin, you may need to expose your skin for a longer period of time. Protect your skin from too much sun exposure. This helps to prevent skin cancer. Ask your health care provider if regular sun exposure is safe for you. Do not use a tanning bed. Eating and drinking  Eat foods that naturally contain vitamin D. These include: Beef liver. Eggs. The vitamin D is in the yolk. Fish, such as salmon or trout. Mushrooms that were treated with UV light. Eat or drink products that have vitamin D added to them (are fortified). These may include: Cereals. Milk, including plant-based alternatives such as almond, soy, or oat milks. Orange juice. Margarine. When choosing foods, check the food label on the package to see: How much vitamin D is in the item. If the food is fortified with vitamin D. The items listed above may not be a complete list of foods and beverages you can eat and drink. Contact a dietitian for more information. Taking supplements and medicines If you are at risk for vitamin D deficiency, or if you have certain diseases, your health care provider may recommend that you take a vitamin D supplement. Make sure you: Talk with your health care provider before you start taking any vitamin D supplements. You may be more sensitive to the side effects of vitamin D supplements if you are on certain medicines or have certain medical conditions. Tell your health care provider about all medicines you are taking, including vitamins, herbs, eye drops, creams, and over-the-counter medicines. Take over-the-counter and prescription medicines only as told by your health care provider. Take supplements only as told by your health care provider. To increase absorption of your supplement, take it with a meal or snack. Summary Vitamin D plays a key role in the health of bones and teeth, reduces inflammation, and improves the body's defense system (immune system). A vitamin D deficiency  can put you at risk of developing conditions such as rickets or osteoporosis. Our bodies make vitamin D when our skin is exposed to direct sunlight. However, for many people, this may not be enough vitamin D to meet the body's needs. Some foods naturally contain vitamin D, including beef liver, egg yolk, and fish. Eat or drink products that have vitamin D added to them (are fortified). This information is not intended to replace advice given to you by your health care provider. Make sure you discuss any questions you have with your health care provider. Document Revised: 04/07/2021 Document Reviewed: 04/07/2021 Elsevier Patient Education  [place] of Quitting Smoking Quitting smoking is a physical and mental challenge. You may have cravings, withdrawal symptoms, and temptation to smoke. Before quitting, work with your health care provider to make a plan that can help you manage quitting. Making a plan before you quit may keep you from smoking when you have the urge to smoke while  trying to quit. How to manage lifestyle changes Managing stress Stress can make you want to smoke, and wanting to smoke may cause stress. It is important to find ways to manage your stress. You could try some of the following: Practice relaxation techniques. Breathe slowly and deeply, in through your nose and out through your mouth. Listen to music. Soak in a bath or take a shower. Imagine a peaceful place or vacation. Get some support. Talk with family or friends about your stress. Join a support group. Talk with a counselor or therapist. Get some physical activity. Go for a walk, run, or bike ride. Play a favorite sport. Practice yoga.  Medicines Talk with your health care provider about medicines that might help you deal with cravings and make quitting easier for you. Relationships Social situations can be difficult when you are quitting smoking. To manage this, you  can: Avoid parties and other social situations where people might be smoking. Avoid alcohol. Leave right away if you have the urge to smoke. Explain to your family and friends that you are quitting smoking. Ask for support and let them know you might be a bit grumpy. Plan activities where smoking is not an option. General instructions Be aware that many people gain weight after they quit smoking. However, not everyone does. To keep from gaining weight, have a plan in place before you quit, and stick to the plan after you quit. Your plan should include: Eating healthy snacks. When you have a craving, it may help to: Eat popcorn, or try carrots, celery, or other cut vegetables. Chew sugar-free gum. Changing how you eat. Eat small portion sizes at meals. Eat 4-6 small meals throughout the day instead of 1-2 large meals a day. Be mindful when you eat. You should avoid watching television or doing other things that might distract you as you eat. Exercising regularly. Make time to exercise each day. If you do not have time for a long workout, do short bouts of exercise for 5-10 minutes several times a day. Do some form of strengthening exercise, such as weight lifting. Do some exercise that gets your heart beating and causes you to breathe deeply, such as walking fast, running, swimming, or biking. This is very important. Drinking plenty of water or other low-calorie or no-calorie drinks. Drink enough fluid to keep your urine pale yellow.  How to recognize withdrawal symptoms Your body and mind may experience discomfort as you try to get used to not having nicotine in your system. These effects are called withdrawal symptoms. They may include: Feeling hungrier than normal. Having trouble concentrating. Feeling irritable or restless. Having trouble sleeping. Feeling depressed. Craving a cigarette. These symptoms may surprise you, but they are normal to have when quitting smoking. To manage  withdrawal symptoms: Avoid places, people, and activities that trigger your cravings. Remember why you want to quit. Get plenty of sleep. Avoid coffee and other drinks that contain caffeine. These may worsen some of your symptoms. How to manage cravings Come up with a plan for how to deal with your cravings. The plan should include the following: A definition of the specific situation you want to deal with. An activity or action you will take to replace smoking. A clear idea for how this action will help. The name of someone who could help you with this. Cravings usually last for 5-10 minutes. Consider taking the following actions to help you with your plan to deal with cravings: Keep your mouth busy. Chew  sugar-free gum. Suck on hard candies or a straw. Brush your teeth. Keep your hands and body busy. Change to a different activity right away. Squeeze or play with a ball. Do an activity or a hobby, such as making bead jewelry, practicing needlepoint, or working with wood. Mix up your normal routine. Take a short exercise break. Go for a quick walk, or run up and down stairs. Focus on doing something kind or helpful for someone else. Call a friend or family member to talk during a craving. Join a support group. Contact a quitline. Where to find support To get help or find a support group: Call the [facility]'s Smoking Quitline: 1-800-QUIT-NOW 3014686506) Text QUIT to SmokefreeTXT: 567014 Where to find more information Visit these websites to find more information on quitting smoking: U.S. Department of Health and Human Services: www.smokefree.gov American Lung Association: www.freedomfromsmoking.org Centers for Disease Control and Prevention (CDC): http://www.wolf.info/ American Heart Association: www.heart.org Contact a health care provider if: You want to change your plan for quitting. The medicines you are taking are not helping. Your eating feels out of control or you  cannot sleep. You feel depressed or become very anxious. Summary Quitting smoking is a physical and mental challenge. You will face cravings, withdrawal symptoms, and temptation to smoke again. Preparation can help you as you go through these challenges. Try different techniques to manage stress, handle social situations, and prevent weight gain. You can deal with cravings by keeping your mouth busy (such as by chewing gum), keeping your hands and body busy, calling family or friends, or contacting a quitline for people who want to quit smoking. You can deal with withdrawal symptoms by avoiding places where people smoke, getting plenty of rest, and avoiding drinks that contain caffeine. This information is not intended to replace advice given to you by your health care provider. Make sure you discuss any questions you have with your health care provider. Document Revised: 06/23/2021 Document Reviewed: 06/23/2021 Elsevier Patient Education  [place].

## 2022-06-12 NOTE — Progress Notes (Signed)
New Patient Office Visit  Subjective    Patient ID: Kirsten James, female    DOB: 05-06-1966  Age: 56 y.o. MRN: [id number]  CC:  Chief Complaint  Patient presents with   Establish Care    HPI Kirsten James presents to establish care and to discuss smoking cessation. Currently vapes nicotine and is requesting wellbutrin to assist with cessation. Eye exam last year; right mastectomy 2021, Cologuard 2023, repeat in 2026. Marland Kitchen  Outpatient Encounter Medications as of 06/12/2022  Medication Sig   Cyanocobalamin (VITAMIN B-12 PO) Take by mouth.   TURMERIC PO Take by mouth.   [DISCONTINUED] Turmeric (QC TUMERIC COMPLEX) 500 MG CAPS    Cholecalciferol (VITAMIN D3) 125 MCG (5000 UT) TABS Take 5,000 Units by mouth daily.   diltiazem (CARDIZEM CD) 240 MG 24 hr capsule Take 1 capsule (240 mg total) by mouth daily.   Fexofenadine-Pseudoephedrine (ALLEGRA-D 24 HOUR PO) Take 180 mg by mouth as needed.   fluticasone (FLONASE) 50 MCG/ACT nasal spray Place 1 spray into both nostrils daily.   Multiple Vitamin (MULTIVITAMIN WITH MINERALS) TABS tablet Take 1 tablet by mouth daily.   [DISCONTINUED] celecoxib (CELEBREX) 100 MG capsule Take 1 capsule (100 mg total) by mouth 2 (two) times daily.   [DISCONTINUED] Magnesium 100 MG CAPS  (Patient not taking: Reported on 01/24/2022)   [DISCONTINUED] nitrofurantoin (MACRODANTIN) 50 MG capsule Take as needed after intercourse to prevent UTIs   No facility-administered encounter medications on file as of 06/12/2022.    Past Medical History:  Diagnosis Date   Allergy    Cancer ([place]) 10/21   Rt mastectomy w/reconstruction   Complication of anesthesia    hiccups    Dysplastic nevus 06/16/2020   L buttocks   Frequent urinary tract infections    GERD (gastroesophageal reflux disease) 2020   Otc alka seltzer   Hypertension     Past Surgical History:  Procedure Laterality Date   BREAST BIOPSY Right 04/05/2020   [place] bx, Q shape marker 9:00,   INVASIVE MAMMARY CARCINOMA   BREAST BIOPSY Right 04/05/2020   [place] bx, x shape marker 1:00, INVASIVE MAMMARY CARCINOMA   BREAST RECONSTRUCTION WITH PLACEMENT OF TISSUE EXPANDER AND FLEX HD (ACELLULAR HYDRATED DERMIS) Right 04/25/2020   Procedure: BREAST RECONSTRUCTION WITH PLACEMENT OF TISSUE EXPANDER AND FLEX HD (ACELLULAR HYDRATED DERMIS);  Surgeon: [name], MD;  Location: [facility];  Service: Plastics;  Laterality: Right;   CHOLECYSTECTOMY     L4-5 herniated disc repair     LIPOSUCTION WITH LIPOFILLING Right 11/15/2020   Procedure: Right breast fat grafting and excess skin excision;  Surgeon: [name], MD;  Location: [facility];  Service: Plastics;  Laterality: Right;   MASTOPEXY Left 06/14/2020   Procedure: LEFT BREAST MASTOPEXY;  Surgeon: [name], MD;  Location: [facility];  Service: Plastics;  Laterality: Left;   NASAL SINUS SURGERY  2005   PLANTAR FASCIA RELEASE     REMOVAL OF TISSUE EXPANDER AND PLACEMENT OF IMPLANT Right 06/14/2020   Procedure: REMOVAL OF TISSUE EXPANDER AND PLACEMENT OF IMPLANT;  Surgeon: [name], MD;  Location: [facility];  Service: Plastics;  Laterality: Right;   SIMPLE MASTECTOMY WITH AXILLARY SENTINEL NODE BIOPSY Right 04/25/2020   Procedure: SIMPLE MASTECTOMY WITH AXILLARY SENTINEL NODE BIOPSY;  Surgeon: Ronny Bacon, MD;  Location: [facility];  Service: General;  Laterality: Right;   [place]  2010   Microdiscrctomy L -4,5    Family History  Problem Relation Age of Onset   Cancer Mother        non-small cell lung cancer- smoker   Alcohol abuse Mother    Stroke Mother    Cancer Father        oral & larynx- smoker   Alcohol abuse Father    Melanoma Maternal Uncle    Alzheimer's disease Maternal Grandmother    Breast cancer Neg Hx     Social History   Socioeconomic History   Marital status: Divorced    Spouse name: Not on file   Number of children: 2   Years of  education: Not on file   Highest education level: Associate degree: occupational, Hotel manager, or vocational program  Occupational History   Not on file  Tobacco Use   Smoking status: Former    Packs/day: 0.50    Years: 15.00    Total pack years: 7.50    Types: Cigarettes, E-cigarettes    Quit date: 10/12/2019    Years since quitting: 2.6   Smokeless tobacco: Never   Tobacco comments:    1/2 pack for 15-20 yrs quit 1995 with occ vaping quit 09/2019  Vaping Use   Vaping Use: Every day   Start date: 10/28/2021   Substances: Nicotine, Flavoring  Substance and Sexual Activity   Alcohol use: Not Currently    Comment: Special occasions. Maybe 4 a year   Drug use: Never   Sexual activity: Yes    Birth control/protection: None    Comment: Post menopausal  Other Topics Concern   Not on file  Social History Narrative   Quit smoking [teen- 1995; quit]; no alcohol. Lives in [place] by self; daughter in [place]. RN- Scientist, research (medical).    Social Determinants of Health   Financial Resource Strain: Low Risk  (05/14/2022)   Overall Financial Resource Strain (CARDIA)    Difficulty of Paying Living Expenses: Not hard at all  Food Insecurity: No Food Insecurity (05/14/2022)   Hunger Vital Sign    Worried About Running Out of Food in the Last Year: Never true    Ran Out of Food in the Last Year: Never true  Transportation Needs: No Transportation Needs (05/14/2022)   PRAPARE - Hydrologist (Medical): No    Lack of Transportation (Non-Medical): No  Physical Activity: Sufficiently Active (05/14/2022)   Exercise Vital Sign    Days of Exercise per Week: 7 days    Minutes of Exercise per Session: 30 min  Stress: No Stress Concern Present (05/14/2022)   [place]    Feeling of Stress : Not at all  Social Connections: Moderately Isolated (05/14/2022)   Social Connection and Isolation  Panel [NHANES]    Frequency of Communication with Friends and Family: More than three times a week    Frequency of Social Gatherings with Friends and Family: More than three times a week    Attends Religious Services: Never    Marine scientist or Organizations: No    Attends Music therapist: Not on file    Marital Status: Living with partner  Intimate Partner Violence: Not At Risk (06/12/2022)   Humiliation, Afraid, Rape, and Kick questionnaire    Fear of Current or Ex-Partner: No    Emotionally Abused: No    Physically Abused: No    Sexually Abused: No    Review of Systems  Constitutional:  Negative for chills, fever and malaise/fatigue ([place]).  HENT:  Negative for ear pain, sinus pain and sore throat.   Respiratory:  Negative for cough and shortness of breath.   Cardiovascular:  Negative for chest pain.  Musculoskeletal:  Negative for myalgias.  Neurological:  Negative for headaches.        Objective    BP 136/82   Pulse 86   Temp (!) 97.2 F (36.2 C)   Ht '5\' 9"'$  (1.753 m)   Wt 232 lb (105.2 kg)   LMP 11/14/2018 (Exact Date)   SpO2 98%   BMI 34.26 kg/m    Physical Exam Vitals reviewed.  Constitutional:      Appearance: Normal appearance.  HENT:     Right Ear: Tympanic membrane normal.     Left Ear: Tympanic membrane normal.     Nose: Nose normal.     Mouth/Throat:     Mouth: Mucous membranes are moist.  Cardiovascular:     Rate and Rhythm: Normal rate and regular rhythm.     Pulses: Normal pulses.     Heart sounds: Normal heart sounds.  Pulmonary:     Effort: Pulmonary effort is normal.     Breath sounds: Normal breath sounds.  Abdominal:     General: Bowel sounds are normal.     Palpations: Abdomen is soft.  Skin:    General: Skin is warm and dry.     Capillary Refill: Capillary refill takes less than 2 seconds.  Neurological:     General: No focal deficit present.     Mental Status: She is alert and oriented to person,  place, and time.  Psychiatric:        Mood and Affect: Mood normal.        Behavior: Behavior normal.        Assessment & Plan:   1. Nicotine dependence due to vaping tobacco product - buPROPion (WELLBUTRIN XL) 150 MG 24 hr tablet; Take 1 tablet (150 mg total) by mouth daily.  Dispense: 90 tablet; Refill: 2  2. Encounter for smoking cessation counseling - buPROPion (WELLBUTRIN XL) 150 MG 24 hr tablet; Take 1 tablet (150 mg total) by mouth daily.  Dispense: 90 tablet; Refill: 2  3. Encounter to establish care    Begin Wellbutrin 150 mg daily for smoking cessation Begin Vitamin D 50, 000 U weekly Vitamin D rich diet Follow-up in 86-month, fasting   Follow-up: 340-month fasting  I, ShRip HarbourNP, have reviewed all documentation for this visit. The documentation on 06/12/22 for the exam, diagnosis, procedures, and orders are all accurate and complete.   Signed, ShJerrell BelfastDNP

## 2022-07-27 ENCOUNTER — Encounter: Payer: 59 | Admitting: Physician Assistant

## 2022-09-07 ENCOUNTER — Encounter (INDEPENDENT_AMBULATORY_CARE_PROVIDER_SITE_OTHER): Payer: Self-pay

## 2022-09-12 IMAGING — MR MR BREAST BILAT WO/W CM
2 of 10 series · 6 of 48 positions shown · IV contrast (9ml Gadavist)
Comparison: Prior mammograms and ultrasounds

CLINICAL DATA: 54-year-old female for staging of recently diagnosed
cancer in the OUTER RIGHT breast and UPPER INNER RIGHT breast.

LABS:  None performed today
EXAM:
BILATERAL BREAST MRI WITH AND WITHOUT CONTRAST
TECHNIQUE: Multiplanar, multisequence MR images of both breasts were obtained
prior to and following the intravenous administration of 9 ml of
Gadavist

[Series 2: T1 · axial · B · 1.5mm · 1.02mm/px · z∈[-80,+87]mm · 5 of 112 slices shown]
[im 1/112]
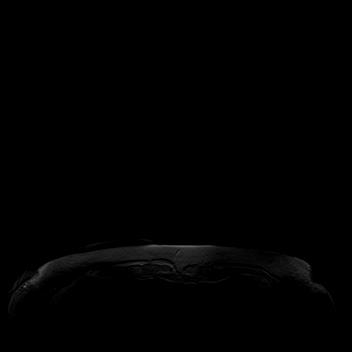
[im 28/112]
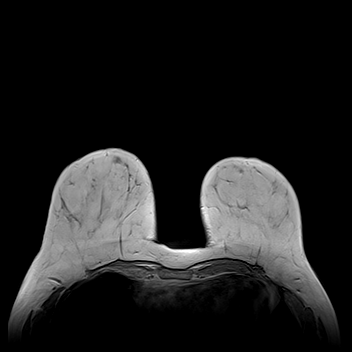
[im 56/112]
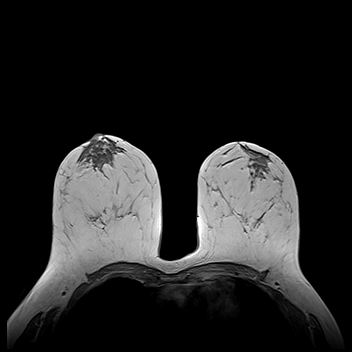
[im 84/112]
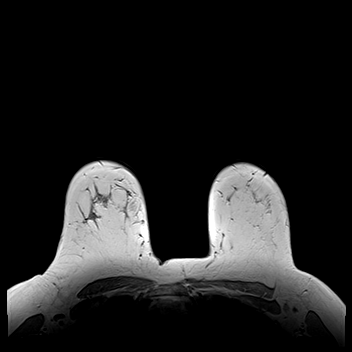
[im 112/112]
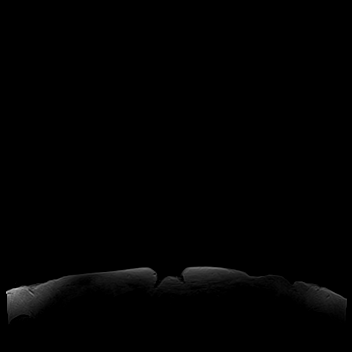

[Series 3: T2 · axial · B · 3.0mm · 1.02mm/px · 1 of 45 slices shown]
[im 1/45]
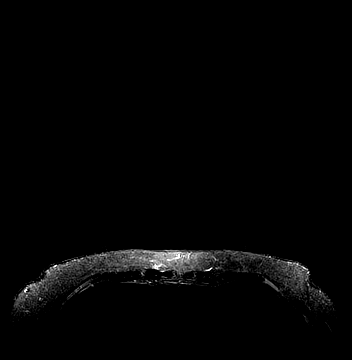

[6 of 48 positions shown; findings below may reference images not displayed]

Three-dimensional MR images were rendered by post-processing of the
original MR data on an independent workstation. The
three-dimensional MR images were interpreted, and findings are
reported in the following complete MRI report for this study. Three
dimensional images were evaluated at the independent interpreting
workstation using the DynaCAD thin client.
FINDINGS: Breast composition: c. Heterogeneous fibroglandular tissue.

Background parenchymal enhancement: Mild

Right breast: Biopsy clip artifact within the anterior OUTER RIGHT
breast is noted with minimal adjacent enhancement consistent with
biopsy-proven malignancy/post biopsy changes (series 10: Image 51).

A 0.8 cm mass located 2 cm anterior/MEDIAL/INFERIOR to the above
biopsy clip artifact is noted ([DATE]).

A 0.6 cm mass located 1.5 cm posterior/LATERAL/SUPERIOR to the above
biopsy clip artifact is noted ([DATE]).

Biopsy clip artifact within the UPPER INNER RIGHT breast, middle
depth, is noted with minimal adjacent enhancement consistent with
biopsy-proven malignancy/post biopsy changes ([DATE]).

Left breast: No mass or abnormal enhancement.

Lymph nodes: No abnormal appearing lymph nodes.

Ancillary findings:  None.
IMPRESSION: 1. Biopsy clip artifact with adjacent enhancement within the OUTER
RIGHT breast and biopsy clip artifact with adjacent enhancement
within the UPPER INNER RIGHT breast, compatible with post biopsy
changes/biopsy-proven malignancy.
2. Indeterminate 0.8 cm mass located 2 cm from the OUTER RIGHT
breast malignancy/biopsy clip artifact and indeterminate 0.6 cm mass
located 1.5 cm from the OUTER RIGHT breast malignancy/biopsy clip
artifact. 0nd-look ultrasound is recommended. If these masses are
not visualized sonographically, than MR guided biopsy is recommended
if breast conservation is desired.
3. No abnormal appearing lymph nodes.
4. No MR evidence of LEFT breast malignancy.

RECOMMENDATION:
0nd-look ultrasound for 2 separate masses within the OUTER RIGHT
breast as discussed above. If breast conservation is desired and
these masses are not visualized sonographically, than MR guided
biopsies would be recommended.

BI-RADS CATEGORY  4: Suspicious.

## 2022-09-13 NOTE — Progress Notes (Signed)
Subjective:  Patient ID: Kirsten James, female    DOB: 11-27-1965  Age: 57 y.o. MRN: [id number]  Chief Complaint  Patient presents with   Hypertension    HPI:  Patient is here for a follow up on her uncontrolled blood pressure and with a new symptoms related to her menopause. She is retired, stays home but very busy with a painting and hiking. She recently quitted her Vaping with Wellbutrin but she does not take Wellbutrin anymore . She wants to start some medicine for her ongoing hot flushes and high blood pressure that is not controlled with Cardizem that she is taking more than 20 yrs by now. She denies headache, Palpitation, SHORTNESS OF BREATH and chest pain.   FOR HYPERTENSION:  She was last seen for hypertension 3 months ago.  BP at that visit was 136/82 . Management includes Cardizem 120 mg OD .  She reports excellent compliance with treatment. She is not having side effects She is following a Regular diet. She is exercising. She does not smoke.She recently quitted vaping   Use of agents associated with hypertension: none.   Menopausal vasomotor symptoms:  Patient had a menopause  may 2020 , patient said she had always hot flushes , during night she is waking up with hot flushes. She is taking melatonin to sleep. She was taking wellbutrin for Nicotine dependence, she said it helped her to quit her vaping but Wellbutrin made her feel nervous so she quitted taking Wellbutrin. She wants something for her hot flushes.  Pertinent labs: Lab Results  Component Value Date   CHOL 203 (H) 10/23/2021   HDL 66 10/23/2021   LDLCALC 117 (H) 10/23/2021   TRIG 111 10/23/2021   CHOLHDL 3.1 10/23/2021   Lab Results  Component Value Date   NA 141 10/23/2021   K 4.2 10/23/2021   CREATININE 0.87 10/23/2021   EGFR 79 10/23/2021   GFRNONAA 84 02/18/2020   GLUCOSE 86 10/23/2021     The 10-year ASCVD risk score (Arnett DK, et al., 2019) is: 2.8%          09/14/2022   10:42  AM 06/12/2022    1:55 PM 10/23/2021    1:15 PM 01/20/2021    1:57 PM 06/06/2020    9:20 AM  Depression screen PHQ 2/9  Decreased Interest 0 0 0 0 0  Down, Depressed, Hopeless 0 0 0 0 0  PHQ - 2 Score 0 0 0 0 0  Altered sleeping  0 0 0 0  Tired, decreased energy  0 '3 3 3  '$ Change in appetite  0 0 0 0  Feeling bad or failure about yourself   0 0 0 0  Trouble concentrating  0 0 0 0  Moving slowly or fidgety/restless  0 0 0 0  Suicidal thoughts  0 0 0 0  PHQ-9 Score  0 '3 3 3  '$ Difficult doing work/chores  Not difficult at all Not difficult at all Not difficult at all Not difficult at all         11/15/2020   10:56 AM 01/20/2021    1:55 PM 10/23/2021    1:14 PM 06/12/2022    1:54 PM 09/14/2022   10:42 AM  Fall Risk  Falls in the past year?  0 0 0 0  Was there an injury with Fall?  0 0 0 0  Fall Risk Category Calculator  0 0 0 0  Fall Risk Category (Retired)  Low Low Low   (  RETIRED) Patient Fall Risk Level Low fall risk Low fall risk Low fall risk Low fall risk   Patient at Risk for Falls Due to  No Fall Risks No Fall Risks No Fall Risks No Fall Risks  Fall risk Follow up  Falls evaluation completed  Falls evaluation completed Falls evaluation completed      Review of Systems  Constitutional:  Negative for chills, fatigue and fever.  HENT:  Negative for congestion, ear pain, rhinorrhea and sore throat.   Respiratory:  Negative for cough and shortness of breath.   Cardiovascular:  Negative for chest pain.  Gastrointestinal:  Negative for abdominal pain, constipation, diarrhea, nausea and vomiting.  Genitourinary:  Negative for dysuria and urgency.  Musculoskeletal:  Negative for back pain and myalgias.  Skin:  Negative for rash.  Neurological:  Negative for dizziness, weakness, light-headedness and headaches.  Psychiatric/Behavioral:  Positive for sleep disturbance (due to hot flushes). Negative for dysphoric mood. The patient is not nervous/anxious.     Current Outpatient Medications  on File Prior to Visit  Medication Sig Dispense Refill   [facility] ASHWAGANDHA PO Take by mouth.     Cyanocobalamin (VITAMIN B-12 PO) Take by mouth.     diltiazem (CARDIZEM CD) 240 MG 24 hr capsule Take 1 capsule (240 mg total) by mouth daily. 90 capsule 1   Fexofenadine-Pseudoephedrine (ALLEGRA-D 24 HOUR PO) Take 180 mg by mouth as needed.     fluticasone (FLONASE) 50 MCG/ACT nasal spray Place 1 spray into both nostrils daily. 16 g 6   MAGNESIUM CITRATE PO Take by mouth.     melatonin 5 MG TABS Take 5 mg by mouth.     Multiple Vitamin (MULTIVITAMIN WITH MINERALS) TABS tablet Take 1 tablet by mouth daily.     TURMERIC PO Take by mouth.     Vitamin D, Ergocalciferol, (DRISDOL) 1.25 MG (50000 UNIT) CAPS capsule Take 1 capsule (50,000 Units total) by mouth every 7 (seven) days. 5 capsule 3   vitamin E 180 MG (400 UNITS) capsule Take 400 Units by mouth daily.     No current facility-administered medications on file prior to visit.   Past Medical History:  Diagnosis Date   Allergy    Cancer ([place]) 10/21   Rt mastectomy w/reconstruction   Complication of anesthesia    hiccups    Dysplastic nevus 06/16/2020   L buttocks   Frequent urinary tract infections    GERD (gastroesophageal reflux disease) 2020   Otc alka seltzer   Hypertension    Past Surgical History:  Procedure Laterality Date   BREAST BIOPSY Right 04/05/2020   [place] bx, Q shape marker 9:00,  INVASIVE MAMMARY CARCINOMA   BREAST BIOPSY Right 04/05/2020   [place] bx, x shape marker 1:00, INVASIVE MAMMARY CARCINOMA   BREAST RECONSTRUCTION WITH PLACEMENT OF TISSUE EXPANDER AND FLEX HD (ACELLULAR HYDRATED DERMIS) Right 04/25/2020   Procedure: BREAST RECONSTRUCTION WITH PLACEMENT OF TISSUE EXPANDER AND FLEX HD (ACELLULAR HYDRATED DERMIS);  Surgeon: [name], MD;  Location: [facility];  Service: Plastics;  Laterality: Right;   CHOLECYSTECTOMY     L4-5 herniated disc repair     LIPOSUCTION WITH LIPOFILLING Right 11/15/2020   Procedure:  Right breast fat grafting and excess skin excision;  Surgeon: [name], MD;  Location: [place];  Service: Plastics;  Laterality: Right;   MASTOPEXY Left 06/14/2020   Procedure: LEFT BREAST MASTOPEXY;  Surgeon: [name], MD;  Location: [place];  Service: Plastics;  Laterality: Left;   NASAL SINUS SURGERY  2005   PLANTAR FASCIA RELEASE     REMOVAL OF TISSUE EXPANDER AND PLACEMENT OF IMPLANT Right 06/14/2020   Procedure: REMOVAL OF TISSUE EXPANDER AND PLACEMENT OF IMPLANT;  Surgeon: [name], MD;  Location: [place];  Service: Plastics;  Laterality: Right;   SIMPLE MASTECTOMY WITH AXILLARY SENTINEL NODE BIOPSY Right 04/25/2020   Procedure: SIMPLE MASTECTOMY WITH AXILLARY SENTINEL NODE BIOPSY;  Surgeon: Ronny Bacon, MD;  Location: [facility];  Service: General;  Laterality: Right;   SPINE SURGERY  2010   Microdiscrctomy L -4,5    Family History  Problem Relation Age of Onset   Cancer Mother        non-small cell lung cancer- smoker   Alcohol abuse Mother    Stroke Mother    Cancer Father        oral & larynx- smoker   Alcohol abuse Father    Melanoma Maternal Uncle    Alzheimer's disease Maternal Grandmother    Breast cancer Neg Hx    Social History   Socioeconomic History   Marital status: Divorced    Spouse name: Not on file   Number of children: 2   Years of education: Not on file   Highest education level: Associate degree: occupational, Hotel manager, or vocational program  Occupational History   Not on file  Tobacco Use   Smoking status: Former    Packs/day: 0.50    Years: 15.00    Total pack years: 7.50    Types: Cigarettes, E-cigarettes    Quit date: 10/12/2019    Years since quitting: 2.9   Smokeless tobacco: Never   Tobacco comments:    1/2 pack for 15-20 yrs quit 1995 with occ vaping quit 09/2019  Vaping Use   Vaping Use: Every day   Start date: 10/28/2021   Substances: Nicotine, Flavoring   Substance and Sexual Activity   Alcohol use: Not Currently    Comment: Special occasions. Maybe 4 a year   Drug use: Never   Sexual activity: Yes    Birth control/protection: None    Comment: Post menopausal  Other Topics Concern   Not on file  Social History Narrative   Quit smoking [teen- 1995; quit]; no alcohol. Lives in [place] by self; daughter in [place]. RN- Scientist, research (medical).    Social Determinants of Health   Financial Resource Strain: Low Risk  (05/14/2022)   Overall Financial Resource Strain (CARDIA)    Difficulty of Paying Living Expenses: Not hard at all  Food Insecurity: No Food Insecurity (05/14/2022)   Hunger Vital Sign    Worried About Running Out of Food in the Last Year: Never true    Ran Out of Food in the Last Year: Never true  Transportation Needs: No Transportation Needs (05/14/2022)   PRAPARE - Hydrologist (Medical): No    Lack of Transportation (Non-Medical): No  Physical Activity: Sufficiently Active (05/14/2022)   Exercise Vital Sign    Days of Exercise per Week: 7 days    Minutes of Exercise per Session: 30 min  Stress: No Stress Concern Present (05/14/2022)   [place]    Feeling of Stress : Not at all  Social Connections: Moderately Isolated (05/14/2022)   Social Connection and Isolation Panel [NHANES]    Frequency of Communication with Friends and Family: More than three times a week    Frequency of  Social Gatherings with Friends and Family: More than three times a week    Attends Religious Services: Never    Marine scientist or Organizations: No    Attends Music therapist: Not on file    Marital Status: Living with partner    Objective:  BP 134/88 (BP Location: Left Arm, Patient Position: Sitting, Cuff Size: Large)   Pulse 73   Temp (!) 97.3 F (36.3 C) (Temporal)   Ht '5\' 9"'$  (1.753 m)   Wt 237 lb 12.8 oz  (107.9 kg)   LMP 11/14/2018 (Exact Date)   SpO2 98%   BMI 35.12 kg/m      09/14/2022   10:40 AM 06/12/2022    1:54 PM 01/24/2022    1:50 PM  BP/Weight  Systolic BP [id number] Q000111Q  Diastolic BP 88 82 94  Wt. (Lbs) 237.8 232   BMI 35.12 kg/m2 34.26 kg/m2     Physical Exam Vitals reviewed.  Constitutional:      Appearance: Normal appearance. She is obese.  Cardiovascular:     Rate and Rhythm: Normal rate and regular rhythm.  Pulmonary:     Effort: Pulmonary effort is normal.     Breath sounds: Normal breath sounds.  Abdominal:     General: Bowel sounds are normal.  Musculoskeletal:        General: Normal range of motion.     Cervical back: Normal range of motion.  Skin:    General: Skin is warm.  Neurological:     Mental Status: She is alert.  Psychiatric:        Mood and Affect: Mood normal.        Behavior: Behavior normal.       Lab Results  Component Value Date   WBC 8.4 10/23/2021   HGB 15.2 10/23/2021   HCT 45.6 10/23/2021   PLT 297 10/23/2021   GLUCOSE 86 10/23/2021   CHOL 203 (H) 10/23/2021   TRIG 111 10/23/2021   HDL 66 10/23/2021   LDLCALC 117 (H) 10/23/2021   ALT 19 10/23/2021   AST 16 10/23/2021   NA 141 10/23/2021   K 4.2 10/23/2021   CL 100 10/23/2021   CREATININE 0.87 10/23/2021   BUN 11 10/23/2021   CO2 23 10/23/2021   TSH 1.070 01/20/2021   Assessment & Plan:    Essential hypertension Assessment & Plan: BP not under controlled Home BP consistently higher than 130/80 Losartan 25 mg added Continue Cardizem OD  Nutrition: Stressed importance of moderation in sodium intake, saturated fat and cholesterol, caloric balance, sufficient intake of complex carbohydrates, fiber, calcium and iron.   Exercise: Stressed the importance of regular exercise.     Orders: -     CBC with Differential/Platelet -     Comprehensive metabolic panel -     TSH -     Lipid panel -     Losartan Potassium; Take 1 tablet (25 mg total) by mouth daily.   Dispense: 90 tablet; Refill: 0  Vitamin D deficiency Assessment & Plan: Continue taking Vitamin D oral once weekly Will check Vitamin D level today  Orders: -     VITAMIN D 25 Hydroxy (Vit-D Deficiency, Fractures)  Mixed hyperlipidemia Assessment & Plan: Will check lipid profile, if LDL still high this time, will add crestor 5 mg  Nutrition: Stressed importance of moderation in sodium intake, saturated fat and cholesterol, caloric balance, sufficient intake of complex carbohydrates, fiber, calcium and iron.   Exercise: Stressed the  importance of regular exercise.     Orders: -     Lipid panel  Menopausal vasomotor syndrome Assessment & Plan: Veozah started 45 mg OD sample given for 2 weeks and coupon given for pharmacy Will closely  monitor  Orders: -     Fezolinetant; Take 1 tablet (45 mg total) by mouth daily.  Dispense: 90 tablet; Refill: 0     Meds ordered this encounter  Medications   losartan (COZAAR) 25 MG tablet    Sig: Take 1 tablet (25 mg total) by mouth daily.    Dispense:  90 tablet    Refill:  0   Fezolinetant 45 MG TABS    Sig: Take 1 tablet (45 mg total) by mouth daily.    Dispense:  90 tablet    Refill:  0    Orders Placed This Encounter  Procedures   CBC with Differential/Platelet   Comprehensive metabolic panel   TSH   Lipid Panel   Vitamin D, 25-hydroxy     Follow-up: Return in about 3 months (around 12/15/2022) for CHRONIC, FASTING. An After Visit Summary was printed and given to the patient.  I, Neil Crouch have reviewed all documentation for this visit. The documentation on 09/14/22   for the exam, diagnosis, procedures, and orders are all accurate and complete.    Neil Crouch, DNP, [facility] [phone]

## 2022-09-14 ENCOUNTER — Ambulatory Visit (INDEPENDENT_AMBULATORY_CARE_PROVIDER_SITE_OTHER): Payer: 59 | Admitting: Nurse Practitioner

## 2022-09-14 ENCOUNTER — Encounter: Payer: Self-pay | Admitting: Nurse Practitioner

## 2022-09-14 VITALS — BP 134/88 | HR 73 | Temp 97.3°F | Ht 69.0 in | Wt 237.8 lb

## 2022-09-14 DIAGNOSIS — E782 Mixed hyperlipidemia: Secondary | ICD-10-CM

## 2022-09-14 DIAGNOSIS — I10 Essential (primary) hypertension: Secondary | ICD-10-CM

## 2022-09-14 DIAGNOSIS — E559 Vitamin D deficiency, unspecified: Secondary | ICD-10-CM

## 2022-09-14 DIAGNOSIS — N951 Menopausal and female climacteric states: Secondary | ICD-10-CM

## 2022-09-14 HISTORY — DX: Menopausal and female climacteric states: N95.1

## 2022-09-14 HISTORY — DX: Mixed hyperlipidemia: E78.2

## 2022-09-14 MED ORDER — LOSARTAN POTASSIUM 25 MG PO TABS: 25.0000 mg | ORAL_TABLET | Freq: Every day | ORAL | 0 refills | Status: DC

## 2022-09-14 MED ORDER — FEZOLINETANT 45 MG PO TABS: 45.0000 mg | ORAL_TABLET | Freq: Every day | ORAL | 0 refills | Status: DC

## 2022-09-14 NOTE — Assessment & Plan Note (Signed)
Continue taking Vitamin D oral once weekly Will check Vitamin D level today

## 2022-09-14 NOTE — Assessment & Plan Note (Signed)
Veozah started 45 mg OD sample given for 2 weeks and coupon given for pharmacy Will closely  monitor

## 2022-09-14 NOTE — Assessment & Plan Note (Signed)
BP not under controlled Home BP consistently higher than 130/80 Losartan 25 mg added Continue Cardizem OD  Nutrition: Stressed importance of moderation in sodium intake, saturated fat and cholesterol, caloric balance, sufficient intake of complex carbohydrates, fiber, calcium and iron.   Exercise: Stressed the importance of regular exercise.

## 2022-09-14 NOTE — Assessment & Plan Note (Signed)
Will check lipid profile, if LDL still high this time, will add crestor 5 mg  Nutrition: Stressed importance of moderation in sodium intake, saturated fat and cholesterol, caloric balance, sufficient intake of complex carbohydrates, fiber, calcium and iron.   Exercise: Stressed the importance of regular exercise.

## 2022-09-14 NOTE — Patient Instructions (Signed)
Fezolinetant Tablets What is this medication? FEZOLINETANT (FEZ oh LIN e tant) reduces the number and severity of hot flashes due to menopause. It works by blocking substances in your body that cause hot flashes and night sweats. This medicine may be used for other purposes; ask your health care provider or pharmacist if you have questions. COMMON BRAND NAME(S): VEOZAH What should I tell my care team before I take this medication? They need to know if you have any of these conditions: Kidney disease Liver disease An unusual or allergic reaction to fezolinetant, other medications, foods, dyes, or preservatives Pregnant or trying to get pregnant Breastfeeding How should I use this medication? Take this medication by mouth with water. Take it as directed on the prescription label at the same time every day. Do not cut, crush, or chew this medication. Swallow the tablets whole. You can take it with or without food. If it upsets your stomach, take it with food. Keep taking it unless your care team tells you to stop. Talk to your care team about the use of this medication in children. Special care may be needed. Overdosage: If you think you have taken too much of this medicine contact a poison control center or emergency room at once. NOTE: This medicine is only for you. Do not share this medicine with others. What if I miss a dose? If you miss a dose, take it as soon as you can unless it is more than 12 hours late. If it is more than 12 hours late, skip the missed dose. Take the next dose at the normal time. What may interact with this medication? Other medications may affect the way this medication works. Talk with your care team about all of the medications you take. They may suggest changes to your treatment plan to lower the risk of side effects and to make sure your medications work as intended. This list may not describe all possible interactions. Give your health care provider a list of all  the medicines, herbs, non-prescription drugs, or dietary supplements you use. Also tell them if you smoke, drink alcohol, or use illegal drugs. Some items may interact with your medicine. What should I watch for while using this medication? Visit your care team for regular checks on your progress. Tell your care team if your symptoms do not start to get better or if they get worse. You may need blood work while taking this medication. What side effects may I notice from receiving this medication? Side effects that you should report to your care team as soon as possible: Allergic reactions--skin rash, itching, hives, swelling of the face, lips, tongue, or throat Liver injury--right upper belly pain, loss of appetite, nausea, light-colored stool, dark yellow or brown urine, yellowing skin or eyes, unusual weakness or fatigue Side effects that usually do not require medical attention (report these to your care team if they continue or are bothersome): Back pain Diarrhea Hot flashes Stomach pain Trouble sleeping This list may not describe all possible side effects. Call your doctor for medical advice about side effects. You may report side effects to FDA at 1-800-FDA-1088. Where should I keep my medication? Keep out of the reach of children and pets. Store at room temperature between 20 and 25 degrees C (68 and 77 degrees F). Get rid of any unused medication after the expiration date. To get rid of medications that are no longer needed or have expired: Take the medication to a medication take-back program. Check with  your pharmacy or law enforcement to find a location. If you cannot return the medication, check the label or package insert to see if the medication should be thrown out in the garbage or flushed down the toilet. If you are not sure, ask your care team. If it is safe to put it in the trash, take the medication out of the container. Mix the medication with cat litter, dirt, coffee  grounds, or other unwanted substance. Seal the mixture in a bag or container. Put it in the trash. NOTE: This sheet is a summary. It may not cover all possible information. If you have questions about this medicine, talk to your doctor, pharmacist, or health care provider.  2023 Elsevier/Gold Standard (2021-12-07 00:00:00)

## 2022-09-15 LAB — CBC WITH DIFFERENTIAL/PLATELET
Basophils Absolute: 0.1 10*3/uL (ref 0.0–0.2)
Basos: 1 %
EOS (ABSOLUTE): 0.1 10*3/uL (ref 0.0–0.4)
Eos: 1 %
Hematocrit: 47.4 % — ABNORMAL HIGH (ref 34.0–46.6)
Hemoglobin: 15.5 g/dL (ref 11.1–15.9)
Immature Grans (Abs): 0 10*3/uL (ref 0.0–0.1)
Immature Granulocytes: 0 %
Lymphocytes Absolute: 2.1 10*3/uL (ref 0.7–3.1)
Lymphs: 27 %
MCH: 28.5 pg (ref 26.6–33.0)
MCHC: 32.7 g/dL (ref 31.5–35.7)
MCV: 87 fL (ref 79–97)
Monocytes Absolute: 0.6 10*3/uL (ref 0.1–0.9)
Monocytes: 7 %
Neutrophils Absolute: 5 10*3/uL (ref 1.4–7.0)
Neutrophils: 64 %
Platelets: 308 10*3/uL (ref 150–450)
RBC: 5.44 x10E6/uL — ABNORMAL HIGH (ref 3.77–5.28)
RDW: 14.4 % (ref 11.7–15.4)
WBC: 7.8 10*3/uL (ref 3.4–10.8)

## 2022-09-15 LAB — COMPREHENSIVE METABOLIC PANEL
ALT: 24 IU/L (ref 0–32)
AST: 21 IU/L (ref 0–40)
Albumin/Globulin Ratio: 1.8 (ref 1.2–2.2)
Albumin: 4.9 g/dL (ref 3.8–4.9)
Alkaline Phosphatase: 94 IU/L (ref 44–121)
BUN/Creatinine Ratio: 22 (ref 9–23)
BUN: 20 mg/dL (ref 6–24)
Bilirubin Total: 0.3 mg/dL (ref 0.0–1.2)
CO2: 23 mmol/L (ref 20–29)
Calcium: 10.1 mg/dL (ref 8.7–10.2)
Chloride: 100 mmol/L (ref 96–106)
Creatinine, Ser: 0.92 mg/dL (ref 0.57–1.00)
Globulin, Total: 2.8 g/dL (ref 1.5–4.5)
Glucose: 109 mg/dL — ABNORMAL HIGH (ref 70–99)
Potassium: 4.8 mmol/L (ref 3.5–5.2)
Sodium: 138 mmol/L (ref 134–144)
Total Protein: 7.7 g/dL (ref 6.0–8.5)
eGFR: 73 mL/min/{1.73_m2} (ref >59)

## 2022-09-15 LAB — LIPID PANEL
Chol/HDL Ratio: 3.5 ratio (ref 0.0–4.4)
Cholesterol, Total: 215 mg/dL — ABNORMAL HIGH (ref 100–199)
HDL: 61 mg/dL (ref >39)
LDL Chol Calc (NIH): 139 mg/dL — ABNORMAL HIGH (ref 0–99)
Triglycerides: 87 mg/dL (ref 0–149)
VLDL Cholesterol Cal: 15 mg/dL (ref 5–40)

## 2022-09-15 LAB — TSH: TSH: 0.843 u[IU]/mL (ref 0.450–4.500)

## 2022-09-15 LAB — VITAMIN D 25 HYDROXY (VIT D DEFICIENCY, FRACTURES): Vit D, 25-Hydroxy: 51.4 ng/mL (ref 30.0–100.0)

## 2022-09-15 LAB — CARDIOVASCULAR RISK ASSESSMENT

## 2022-09-16 ENCOUNTER — Other Ambulatory Visit: Payer: Self-pay | Admitting: Nurse Practitioner

## 2022-09-16 ENCOUNTER — Encounter: Payer: Self-pay | Admitting: Nurse Practitioner

## 2022-09-16 DIAGNOSIS — E782 Mixed hyperlipidemia: Secondary | ICD-10-CM

## 2022-09-16 MED ORDER — ROSUVASTATIN CALCIUM 5 MG PO TABS: 5.0000 mg | ORAL_TABLET | Freq: Every day | ORAL | 3 refills | Status: DC

## 2022-09-17 ENCOUNTER — Other Ambulatory Visit: Payer: Self-pay | Admitting: Nurse Practitioner

## 2022-09-18 NOTE — Telephone Encounter (Signed)
Called to cancel rx at pharmacy. Its cancelled.

## 2022-10-17 ENCOUNTER — Ambulatory Visit (INDEPENDENT_AMBULATORY_CARE_PROVIDER_SITE_OTHER): Payer: 59

## 2022-10-17 ENCOUNTER — Encounter: Payer: Self-pay | Admitting: Podiatry

## 2022-10-17 ENCOUNTER — Ambulatory Visit: Payer: 59 | Admitting: Podiatry

## 2022-10-17 DIAGNOSIS — R52 Pain, unspecified: Secondary | ICD-10-CM | POA: Diagnosis not present

## 2022-10-17 DIAGNOSIS — M7672 Peroneal tendinitis, left leg: Secondary | ICD-10-CM

## 2022-10-17 MED ORDER — MELOXICAM 15 MG PO TABS
15.0000 mg | ORAL_TABLET | Freq: Every day | ORAL | 0 refills | Status: DC
Start: 2022-10-17 — End: 2022-12-20

## 2022-10-17 MED ORDER — METHYLPREDNISOLONE 4 MG PO TBPK
ORAL_TABLET | ORAL | 0 refills | Status: DC
Start: 2022-10-17 — End: 2022-12-20

## 2022-10-17 NOTE — Progress Notes (Signed)
  Subjective:  Patient ID: Kirsten James, female    DOB: 02-02-1966,   MRN: [id number]  Chief Complaint  Patient presents with   foot and ankle pain    Pain located in the left foot- ball of foot near great hallux, heel and lateral side of foot  and ankle, Pain radiates to the calf     57 y.o. female presents for concern of left foot pain that has been going on for several months. Relates pain near the ball of her foot, on the side of her foot and ankle. Relates the pain radiates to her calf.  She has seen Dr. Amalia Hailey in the past for different pain that cleared up after steroids. Denies any other pedal complaints. Denies n/v/f/c.   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 CFT <3 seconds. Normal hair growth on digits. No edema.  Skin. No lacerations or abrasions bilateral feet.  Musculoskeletal: MMT 5/5 bilateral lower extremities in DF, PF, Inversion and Eversion. Deceased ROM in DF of ankle joint. Tender to insertion of peroneal tendon and pain along the proximal course of the peroneal tendon posterior to the lateral malleolus. Pain with eversion and DF. Some pain with inversion and PF. Some pinpoint tenderness to plantar medial first metatarsal head.  Neurological: Sensation intact to light touch.   Assessment:   1. Peroneal tendonitis, left      Plan:  Patient was evaluated and treated and all questions answered. X-rays reviewed and discussed with patient. No acute fractures or dislocations noted.  Discussed peroneal tendinitis and treatment options at length with patient Discussed stretching exercises and provided handout. Prescription for  medrol dose pack Prescription for meloxicam provided to be taken after dose pack.  Dispensed Tri-Lock ankle brace. Discussed that if the symptoms do not improve can consider PT/MRI. Patient to return in 6 to 8 weeks or sooner if symptoms fail to improve or worsen.   Lorenda Peck, DPM

## 2022-10-17 NOTE — Patient Instructions (Signed)
Peroneal Tendinopathy Rehab Ask your health care provider which exercises are safe for you. Do exercises exactly as told by your health care provider and adjust them as directed. It is normal to feel mild stretching, pulling, tightness, or discomfort as you do these exercises. Stop right away if you feel sudden pain or your pain gets worse. Do not begin these exercises until told by your health care provider. Stretching and range-of-motion exercises These exercises warm up your muscles and joints. They can help improve the movement and flexibility of your ankle. They may also help to relieve pain and stiffness. Gastrocnemius and soleus stretch, standing This is an exercise in which you stand on a step and use your body weight to stretch your calf muscles. To do this exercise: Stand on the edge of a step on the ball of your left / right foot. The ball of your foot is on the walking surface, right under your toes. Keep your other foot firmly on the same step. Hold on to the wall, a railing, or a chair for balance. Slowly lift your other foot, allowing your body weight to press your left / right heel down over the edge of the step. You should feel a stretch in your left / right calf (gastrocnemius and soleus). Hold this position for __________ seconds. Return both feet to the step. Repeat this exercise with a slight bend in your left / right knee. Repeat __________ times with your left / right knee straight and __________ times with your left / right knee bent. Complete this exercise __________ times a day. Strengthening exercises These exercises build strength and endurance in your foot and ankle. Endurance is the ability to use your muscles for a long time, even after they get tired. Ankle dorsiflexion with band  Secure a rubber exercise band or tube to an object, such as a table leg, that will not move when the band is pulled. Secure the other end of the band around your left / right foot. Sit on  the floor. Face the object with your left / right leg extended. The band or tube should be slightly tense when your foot is relaxed. Slowly flex your left / right ankle and toes to bring your foot toward you (dorsiflexion). Hold this position for __________ seconds. Let the band or tube slowly pull your foot back to the starting position. Repeat __________ times. Complete this exercise __________ times a day. Ankle eversion  Sit on the floor with your legs straight out in front of you. Loop a rubber exercise band or tube around the ball of your left / right foot. The ball of your foot is on the walking surface, right under your toes. Hold the ends of the band in your hands. You can also secure the band to a stable object. The band or tube should be slightly tense when your foot is relaxed. Slowly push your foot outward, away from your other leg (eversion). Hold this position for __________ seconds. Slowly return your foot to the starting position. Repeat __________ times. Complete this exercise __________ times a day. Plantar flexion, standing This exercise is sometimes called a standing heel raise. Stand with your feet shoulder-width apart. Place your hands on a wall or table to steady yourself as needed. Try not to use it for support. Keep your weight spread evenly over the width of your feet while you slowly rise up on your toes (plantar flexion). If told by your health care provider: Shift your weight   toward your left / right leg until you feel challenged. Stand on your left / right leg only. Hold this position for __________ seconds. Repeat __________ times. Complete this exercise __________ times a day. Single leg stand  Without shoes, stand near a railing or in a doorway. You may hold on to the railing or doorframe as needed. Stand on your left / right foot. Keep your big toe down on the floor and try to keep your arch lifted. Do not roll to the outside of your foot. If this  exercise is too easy, you can try it with your eyes closed or while standing on a pillow. Hold this position for __________ seconds. Repeat __________ times. Complete this exercise __________ times a day. This information is not intended to replace advice given to you by your health care provider. Make sure you discuss any questions you have with your health care provider. Document Revised: 10/26/2021 Document Reviewed: 10/26/2021 Elsevier Patient Education  2023 [employer] Inc.  

## 2022-11-12 ENCOUNTER — Other Ambulatory Visit: Payer: Self-pay

## 2022-11-12 DIAGNOSIS — E559 Vitamin D deficiency, unspecified: Secondary | ICD-10-CM

## 2022-11-12 MED ORDER — VITAMIN D (ERGOCALCIFEROL) 1.25 MG (50000 UNIT) PO CAPS
50000.0000 [IU] | ORAL_CAPSULE | ORAL | 3 refills | Status: DC
Start: 2022-11-12 — End: 2023-04-01

## 2022-11-29 ENCOUNTER — Ambulatory Visit: Payer: 59 | Admitting: Podiatry

## 2022-12-05 DIAGNOSIS — N958 Other specified menopausal and perimenopausal disorders: Secondary | ICD-10-CM | POA: Diagnosis not present

## 2022-12-05 DIAGNOSIS — N9089 Other specified noninflammatory disorders of vulva and perineum: Secondary | ICD-10-CM | POA: Diagnosis not present

## 2022-12-05 DIAGNOSIS — Z87440 Personal history of urinary (tract) infections: Secondary | ICD-10-CM | POA: Diagnosis not present

## 2022-12-06 DIAGNOSIS — A630 Anogenital (venereal) warts: Secondary | ICD-10-CM | POA: Diagnosis not present

## 2022-12-06 DIAGNOSIS — N941 Unspecified dyspareunia: Secondary | ICD-10-CM | POA: Diagnosis not present

## 2022-12-06 DIAGNOSIS — N9089 Other specified noninflammatory disorders of vulva and perineum: Secondary | ICD-10-CM | POA: Diagnosis not present

## 2022-12-06 DIAGNOSIS — N952 Postmenopausal atrophic vaginitis: Secondary | ICD-10-CM | POA: Diagnosis not present

## 2022-12-11 ENCOUNTER — Other Ambulatory Visit: Payer: Self-pay

## 2022-12-11 DIAGNOSIS — I10 Essential (primary) hypertension: Secondary | ICD-10-CM

## 2022-12-11 MED ORDER — LOSARTAN POTASSIUM 25 MG PO TABS
25.0000 mg | ORAL_TABLET | Freq: Every day | ORAL | 0 refills | Status: DC
Start: 2022-12-11 — End: 2022-12-20

## 2022-12-20 ENCOUNTER — Other Ambulatory Visit: Payer: Self-pay | Admitting: Physician Assistant

## 2022-12-20 ENCOUNTER — Encounter: Payer: Self-pay | Admitting: Physician Assistant

## 2022-12-20 ENCOUNTER — Ambulatory Visit (INDEPENDENT_AMBULATORY_CARE_PROVIDER_SITE_OTHER): Payer: 59 | Admitting: Physician Assistant

## 2022-12-20 VITALS — BP 140/90 | HR 85 | Temp 98.5°F | Resp 12 | Ht 69.0 in | Wt 229.0 lb

## 2022-12-20 DIAGNOSIS — E782 Mixed hyperlipidemia: Secondary | ICD-10-CM | POA: Diagnosis not present

## 2022-12-20 DIAGNOSIS — E661 Drug-induced obesity: Secondary | ICD-10-CM

## 2022-12-20 DIAGNOSIS — E559 Vitamin D deficiency, unspecified: Secondary | ICD-10-CM

## 2022-12-20 DIAGNOSIS — R739 Hyperglycemia, unspecified: Secondary | ICD-10-CM

## 2022-12-20 DIAGNOSIS — I10 Essential (primary) hypertension: Secondary | ICD-10-CM | POA: Diagnosis not present

## 2022-12-20 DIAGNOSIS — Z6833 Body mass index (BMI) 33.0-33.9, adult: Secondary | ICD-10-CM | POA: Diagnosis not present

## 2022-12-20 DIAGNOSIS — Z1231 Encounter for screening mammogram for malignant neoplasm of breast: Secondary | ICD-10-CM | POA: Diagnosis not present

## 2022-12-20 DIAGNOSIS — N9089 Other specified noninflammatory disorders of vulva and perineum: Secondary | ICD-10-CM | POA: Diagnosis not present

## 2022-12-20 MED ORDER — DILTIAZEM HCL ER COATED BEADS 360 MG PO CP24
360.0000 mg | ORAL_CAPSULE | Freq: Every day | ORAL | 3 refills | Status: DC
Start: 2022-12-20 — End: 2023-05-16

## 2022-12-20 MED ORDER — WEGOVY 0.25 MG/0.5ML ~~LOC~~ SOAJ
0.2500 mg | SUBCUTANEOUS | 0 refills | Status: DC
Start: 2022-12-20 — End: 2022-12-20

## 2022-12-20 NOTE — Progress Notes (Signed)
Subjective:  Patient ID: Kirsten James, female    DOB: 19-Jun-1966  Age: 57 y.o. MRN: [id number]  Chief Complaint  Patient presents with   Hyperlipidemia   Hypertension   PT NEW TO ME _ [facility] PT HPI Pt with history of hypertension for more than 20 years.  She states she had been on cardizem 240mg  for most of that time.  She states that bp at home has been ranging 140-150/80-90s.  At last visit with other provider pt states that cardizem was stopped and she started losartan 25mg  qd -- this of course has not controlled bp When asking pt about why cardizem was started she actually had been prescribed that for benign palpitations - had not had issues in several years until she stopped cardizem and has intermittent what sounds like PVCs She denies chest pain/sob/edema  Pt would like to try Wegovy to help with weight loss.  She is at 9 BMI - she is very active and does watch her diet regularly.    Pt would like to schedule mammogram with mobile unit  Pt with history of vit D def - on weekly supplement  It is noted last labwork showed mildly elevated glucose - will order hgb a1c     12/20/2022   11:22 AM 09/14/2022   10:42 AM 06/12/2022    1:55 PM 10/23/2021    1:15 PM 01/20/2021    1:57 PM  Depression screen PHQ 2/9  Decreased Interest 0 0 0 0 0  Down, Depressed, Hopeless 0 0 0 0 0  PHQ - 2 Score 0 0 0 0 0  Altered sleeping 0  0 0 0  Tired, decreased energy 0  0 3 3  Change in appetite 0  0 0 0  Feeling bad or failure about yourself  2  0 0 0  Trouble concentrating 0  0 0 0  Moving slowly or fidgety/restless 0  0 0 0  Suicidal thoughts 0  0 0 0  PHQ-9 Score 2  0 3 3  Difficult doing work/chores Not difficult at all  Not difficult at all Not difficult at all Not difficult at all        01/20/2021    1:55 PM 10/23/2021    1:14 PM 06/12/2022    1:54 PM 09/14/2022   10:42 AM 12/20/2022   11:22 AM  Fall Risk  Falls in the past year? 0 0 0 0 0  Was there an injury with Fall? 0 0 0 0  0  Fall Risk Category Calculator 0 0 0 0 0  Fall Risk Category (Retired) Low Low Low    (RETIRED) Patient Fall Risk Level Low fall risk Low fall risk Low fall risk    Patient at Risk for Falls Due to No Fall Risks No Fall Risks No Fall Risks No Fall Risks No Fall Risks  Fall risk Follow up Falls evaluation completed  Falls evaluation completed Falls evaluation completed Falls prevention discussed     ROS CONSTITUTIONAL: Negative for chills, fatigue, fever, unintentional weight gain and unintentional weight loss.  E/N/T: Negative for ear pain, nasal congestion and sore throat.  CARDIOVASCULAR: see HPI RESPIRATORY: Negative for recent cough and dyspnea.  GASTROINTESTINAL: Negative for abdominal pain, acid reflux symptoms, constipation, diarrhea, nausea and vomiting.  MSK: Negative for arthralgias and myalgias.  INTEGUMENTARY: Negative for rash.  NEUROLOGICAL: Negative for dizziness and headaches.  PSYCHIATRIC: Negative for sleep disturbance and to question depression screen.  Negative for depression, negative for  anhedonia.    Current Outpatient Medications:    [facility] ASHWAGANDHA PO, Take by mouth., Disp: , Rfl:    Cyanocobalamin (VITAMIN B-12 PO), Take by mouth., Disp: , Rfl:    diltiazem (CARDIZEM CD) 360 MG 24 hr capsule, Take 1 capsule (360 mg total) by mouth daily., Disp: 30 capsule, Rfl: 3   estradiol (ESTRACE) 0.1 MG/GM vaginal cream, Place 1 Applicatorful vaginally at bedtime., Disp: , Rfl:    fluticasone (FLONASE) 50 MCG/ACT nasal spray, Place 1 spray into both nostrils daily., Disp: 16 g, Rfl: 6   melatonin 5 MG TABS, Take 5 mg by mouth., Disp: , Rfl:    Multiple Vitamin (MULTIVITAMIN WITH MINERALS) TABS tablet, Take 1 tablet by mouth daily., Disp: , Rfl:    Semaglutide-Weight Management (WEGOVY) 0.25 MG/0.5ML SOAJ, Inject 0.25 mg into the skin once a week., Disp: 2 mL, Rfl: 0   TURMERIC PO, Take by mouth., Disp: , Rfl:    Vitamin D, Ergocalciferol, (DRISDOL) 1.25 MG (50000 UNIT)  CAPS capsule, Take 1 capsule (50,000 Units total) by mouth every 7 (seven) days., Disp: 5 capsule, Rfl: 3   vitamin E 180 MG (400 UNITS) capsule, Take 400 Units by mouth daily., Disp: , Rfl:   Past Medical History:  Diagnosis Date   Allergy    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   Complication of anesthesia    hiccups    Dysplastic nevus 06/16/2020   L buttocks   Frequent urinary tract infections    GERD (gastroesophageal reflux disease) 2020   Otc alka seltzer   Hypertension    Objective:  PHYSICAL EXAM:   BP (!) 140/90   Pulse 85   Temp 98.5 F (36.9 C)   Resp 12   Ht 5\' 9"  (1.753 m)   Wt 229 lb (103.9 kg)   LMP 11/14/2018 (Exact Date)   SpO2 96%   BMI 33.82 kg/m    GEN: Well nourished, well developed, in no acute distress  Cardiac: RRR; no murmurs, rubs, or gallops,no edema -  Respiratory:  normal respiratory rate and pattern with no distress - normal breath sounds with no rales, rhonchi, wheezes or rubs GI: normal bowel sounds, no masses or tenderness MS: no deformity or atrophy  Skin: warm and dry, no rash  Psych: euthymic mood, appropriate affect and demeanor  Assessment & Plan:    Mixed hyperlipidemia -     Lipid panel Continue to watch diet Essential hypertension -     CBC with Differential/Platelet -     Comprehensive metabolic panel -     TSH Stop losartan 25mg  Rx for cardizem 360mg  qd Vitamin D deficiency -     VITAMIN D 25 Hydroxy (Vit-D Deficiency, Fractures) Continue supplement Hyperglycemia -     Hemoglobin A1c Watch diet  Class 1 obesity with comorbidity Watch diet Rx for wegovy  Breast cancer screening Mammogram scheduled  Follow-up: Return in about 6 months (around 06/21/2023) for chronic fasting follow-up - bp check nurse visit one month.  An After Visit Summary was printed and given to the patient.  Jettie Pagan [facility] [phone]

## 2022-12-21 LAB — CBC WITH DIFFERENTIAL/PLATELET
Basophils Absolute: 0.1 10*3/uL (ref 0.0–0.2)
Basos: 1 %
EOS (ABSOLUTE): 0.1 10*3/uL (ref 0.0–0.4)
Eos: 2 %
Hematocrit: 47.1 % — ABNORMAL HIGH (ref 34.0–46.6)
Hemoglobin: 15.1 g/dL (ref 11.1–15.9)
Immature Grans (Abs): 0 10*3/uL (ref 0.0–0.1)
Immature Granulocytes: 0 %
Lymphocytes Absolute: 2.3 10*3/uL (ref 0.7–3.1)
Lymphs: 34 %
MCH: 28.1 pg (ref 26.6–33.0)
MCHC: 32.1 g/dL (ref 31.5–35.7)
MCV: 88 fL (ref 79–97)
Monocytes Absolute: 0.5 10*3/uL (ref 0.1–0.9)
Monocytes: 8 %
Neutrophils Absolute: 3.7 10*3/uL (ref 1.4–7.0)
Neutrophils: 55 %
Platelets: 293 10*3/uL (ref 150–450)
RBC: 5.37 x10E6/uL — ABNORMAL HIGH (ref 3.77–5.28)
RDW: 14.4 % (ref 11.7–15.4)
WBC: 6.7 10*3/uL (ref 3.4–10.8)

## 2022-12-21 LAB — LIPID PANEL
Chol/HDL Ratio: 3.2 ratio (ref 0.0–4.4)
Cholesterol, Total: 207 mg/dL — ABNORMAL HIGH (ref 100–199)
HDL: 65 mg/dL (ref >39)
LDL Chol Calc (NIH): 123 mg/dL — ABNORMAL HIGH (ref 0–99)
Triglycerides: 109 mg/dL (ref 0–149)
VLDL Cholesterol Cal: 19 mg/dL (ref 5–40)

## 2022-12-21 LAB — COMPREHENSIVE METABOLIC PANEL
ALT: 21 IU/L (ref 0–32)
AST: 18 IU/L (ref 0–40)
Albumin/Globulin Ratio: 1.8 (ref 1.2–2.2)
Albumin: 4.8 g/dL (ref 3.8–4.9)
Alkaline Phosphatase: 94 IU/L (ref 44–121)
BUN/Creatinine Ratio: 14 (ref 9–23)
BUN: 11 mg/dL (ref 6–24)
Bilirubin Total: 0.4 mg/dL (ref 0.0–1.2)
CO2: 23 mmol/L (ref 20–29)
Calcium: 10 mg/dL (ref 8.7–10.2)
Chloride: 98 mmol/L (ref 96–106)
Creatinine, Ser: 0.8 mg/dL (ref 0.57–1.00)
Globulin, Total: 2.6 g/dL (ref 1.5–4.5)
Glucose: 88 mg/dL (ref 70–99)
Potassium: 5.2 mmol/L (ref 3.5–5.2)
Sodium: 137 mmol/L (ref 134–144)
Total Protein: 7.4 g/dL (ref 6.0–8.5)
eGFR: 86 mL/min/{1.73_m2} (ref >59)

## 2022-12-21 LAB — TSH: TSH: 0.801 u[IU]/mL (ref 0.450–4.500)

## 2022-12-21 LAB — HEMOGLOBIN A1C
Est. average glucose Bld gHb Est-mCnc: 114 mg/dL
Hgb A1c MFr Bld: 5.6 % (ref 4.8–5.6)

## 2022-12-21 LAB — VITAMIN D 25 HYDROXY (VIT D DEFICIENCY, FRACTURES): Vit D, 25-Hydroxy: 51.4 ng/mL (ref 30.0–100.0)

## 2022-12-24 ENCOUNTER — Encounter: Payer: Self-pay | Admitting: Physician Assistant

## 2022-12-26 ENCOUNTER — Telehealth: Payer: Self-pay

## 2022-12-26 NOTE — Telephone Encounter (Signed)
Patient made aware PA for wegovy was denied

## 2023-01-21 ENCOUNTER — Ambulatory Visit: Payer: 59

## 2023-01-21 VITALS — BP 136/78 | HR 68

## 2023-01-21 DIAGNOSIS — I10 Essential (primary) hypertension: Secondary | ICD-10-CM

## 2023-01-21 NOTE — Progress Notes (Signed)
Kirsten James comes in for recheck of her bp.  Her home reading this morning was 162/88 but she has concerns that her cuff is too small and may not be working properly.

## 2023-01-21 NOTE — Patient Instructions (Signed)
Continue current medication.  Bring her bp cuff in for comparison at a nurse visit.

## 2023-03-11 ENCOUNTER — Other Ambulatory Visit: Payer: Self-pay

## 2023-03-11 DIAGNOSIS — J309 Allergic rhinitis, unspecified: Secondary | ICD-10-CM

## 2023-03-11 MED ORDER — FLUTICASONE PROPIONATE 50 MCG/ACT NA SUSP
1.0000 | Freq: Every day | NASAL | 6 refills | Status: DC
Start: 2023-03-11 — End: 2023-06-21

## 2023-03-30 ENCOUNTER — Other Ambulatory Visit: Payer: Self-pay | Admitting: Physician Assistant

## 2023-03-30 DIAGNOSIS — E559 Vitamin D deficiency, unspecified: Secondary | ICD-10-CM

## 2023-04-24 ENCOUNTER — Ambulatory Visit
Admission: RE | Admit: 2023-04-24 | Discharge: 2023-04-24 | Disposition: A | Discharge status: Home / Self Care | Payer: 59 | Source: Ambulatory Visit | Attending: Physician Assistant | Admitting: Physician Assistant

## 2023-04-24 ENCOUNTER — Other Ambulatory Visit: Payer: Self-pay | Admitting: Physician Assistant

## 2023-04-24 DIAGNOSIS — Z1231 Encounter for screening mammogram for malignant neoplasm of breast: Secondary | ICD-10-CM | POA: Diagnosis not present

## 2023-04-24 DIAGNOSIS — E66811 Obesity, class 1: Secondary | ICD-10-CM

## 2023-04-24 DIAGNOSIS — E559 Vitamin D deficiency, unspecified: Secondary | ICD-10-CM

## 2023-04-24 DIAGNOSIS — R739 Hyperglycemia, unspecified: Secondary | ICD-10-CM

## 2023-04-24 DIAGNOSIS — E782 Mixed hyperlipidemia: Secondary | ICD-10-CM

## 2023-04-24 DIAGNOSIS — I10 Essential (primary) hypertension: Secondary | ICD-10-CM

## 2023-04-24 HISTORY — DX: Malignant neoplasm of unspecified site of unspecified female breast: C50.919

## 2023-05-06 ENCOUNTER — Encounter: Payer: Self-pay | Admitting: Podiatry

## 2023-05-06 ENCOUNTER — Ambulatory Visit: Payer: 59 | Admitting: Podiatry

## 2023-05-06 DIAGNOSIS — M67879 Other specified disorders of synovium and tendon, unspecified ankle and foot: Secondary | ICD-10-CM

## 2023-05-06 MED ORDER — MELOXICAM 15 MG PO TABS: 15.0000 mg | ORAL_TABLET | Freq: Every day | ORAL | 0 refills | Status: DC

## 2023-05-06 NOTE — Progress Notes (Signed)
Subjective:  Patient ID: Kirsten James, female    DOB: Mar 30, 1966,  MRN: [id number]  Chief Complaint  Patient presents with   Soft tissue mass    Thickening over left achilles region. Palpable. Sore with activity and direct pressure. Denies any acute trauma. Noticed it about 1 month ago gradual onset    57 y.o. female presents with concern for mass at the back of the left Achilles.  Notes a soft mass that appears to be movable in the posterior part of the Achilles.  Noticed about 1 month ago gradual onset.  Does have some soreness in the area with activity.  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  No significant pain on palpation there does appear to be some mobility to the mass does not appear to be firm /osseous with palpation  Vascular:  Dorsalis Pedis artery and Posterior Tibial artery pedal pulses are 2/4 bilateral.  Capillary fill time < 3 sec to all digits.   Neruologic: Grossly intact via light touch bilateral. Protective threshold intact to all sites bilateral.   Musculoskeletal: Sore with palpation along the posterior aspect of the Achilles left ankle  Gait: Unassisted, Nonantalgic.   No images are attached to the encounter.  Radiographs:  Deferred Assessment:   1. Mass of Achilles tendon       Plan:  Patient was evaluated and treated and all questions answered.  # Soft tissue mass noted about the Achilles tendon, possible fibroma versus giant cell tumor versus bursitis -Discussed with patient that overall this mass is very likely benign (though cannot rule out completely malignancy) given slow onset and minimal pain -Recommend that she continue to monitor the area for changing and if it continues to grow or becomes more painful would recommend following up for x-ray imaging followed by MRI -offered x-ray and MRI at this time however patient wanted to continue to monitor clinically for now -Will send E Rx for meloxicam 15 mg take once daily as needed for the next 3 days for inflammation and pain at the area.  Monitor for changing  Return if symptoms worsen or fail to improve.          Corinna Gab, DPM [facility] / [facility]

## 2023-05-15 ENCOUNTER — Other Ambulatory Visit: Payer: Self-pay

## 2023-05-16 ENCOUNTER — Other Ambulatory Visit: Payer: Self-pay | Admitting: Physician Assistant

## 2023-05-16 MED ORDER — DILTIAZEM HCL ER 240 MG PO CP24: 240.0000 mg | ORAL_CAPSULE | Freq: Every day | ORAL | 0 refills | Status: DC

## 2023-05-17 DIAGNOSIS — Z419 Encounter for procedure for purposes other than remedying health state, unspecified: Secondary | ICD-10-CM | POA: Diagnosis not present

## 2023-05-29 NOTE — Telephone Encounter (Unsigned)
Copied from CRM [id number]. Topic: Clinical - Medication Question >> May 29, 2023  3:16 PM Lovey Newcomer R wrote: Reason for CRM: Pt states she did an at home test and it shows positive for BV. Asking if [facility]. cb# [id number]

## 2023-06-02 ENCOUNTER — Other Ambulatory Visit: Payer: Self-pay | Admitting: Podiatry

## 2023-06-16 DIAGNOSIS — Z419 Encounter for procedure for purposes other than remedying health state, unspecified: Secondary | ICD-10-CM | POA: Diagnosis not present

## 2023-06-20 ENCOUNTER — Other Ambulatory Visit: Payer: Self-pay | Admitting: Physician Assistant

## 2023-06-20 DIAGNOSIS — Z0120 Encounter for dental examination and cleaning without abnormal findings: Secondary | ICD-10-CM | POA: Diagnosis not present

## 2023-06-20 DIAGNOSIS — J309 Allergic rhinitis, unspecified: Secondary | ICD-10-CM

## 2023-06-20 DIAGNOSIS — N390 Urinary tract infection, site not specified: Secondary | ICD-10-CM

## 2023-06-21 ENCOUNTER — Ambulatory Visit: Payer: Medicaid Other | Admitting: Physician Assistant

## 2023-06-21 ENCOUNTER — Encounter: Payer: Self-pay | Admitting: Physician Assistant

## 2023-06-21 VITALS — BP 150/86 | HR 74 | Temp 97.7°F | Ht 69.0 in | Wt 224.0 lb

## 2023-06-21 DIAGNOSIS — I10 Essential (primary) hypertension: Secondary | ICD-10-CM

## 2023-06-21 DIAGNOSIS — E559 Vitamin D deficiency, unspecified: Secondary | ICD-10-CM | POA: Diagnosis not present

## 2023-06-21 DIAGNOSIS — Z6833 Body mass index (BMI) 33.0-33.9, adult: Secondary | ICD-10-CM

## 2023-06-21 DIAGNOSIS — E782 Mixed hyperlipidemia: Secondary | ICD-10-CM | POA: Diagnosis not present

## 2023-06-21 DIAGNOSIS — E66811 Obesity, class 1: Secondary | ICD-10-CM | POA: Diagnosis not present

## 2023-06-21 DIAGNOSIS — E661 Drug-induced obesity: Secondary | ICD-10-CM | POA: Diagnosis not present

## 2023-06-21 MED ORDER — ESTRADIOL 0.1 MG/GM VA CREA: 1.0000 | TOPICAL_CREAM | Freq: Every day | VAGINAL | 1 refills | Status: DC

## 2023-06-21 MED ORDER — DILTIAZEM HCL ER COATED BEADS 360 MG PO CP24: 360.0000 mg | ORAL_CAPSULE | Freq: Every day | ORAL | 0 refills | Status: DC

## 2023-06-21 MED ORDER — NITROFURANTOIN MACROCRYSTAL 50 MG PO CAPS: 50.0000 mg | ORAL_CAPSULE | ORAL | 1 refills | Status: DC | PRN

## 2023-06-21 MED ORDER — WEGOVY 0.5 MG/0.5ML ~~LOC~~ SOAJ: 0.5000 mg | SUBCUTANEOUS | 0 refills | Status: DC

## 2023-06-21 NOTE — Progress Notes (Signed)
Subjective:  Patient ID: Kirsten James, female    DOB: 1966-04-04  Age: 57 y.o. MRN: [id number]  Chief Complaint  Patient presents with   Medical Management of Chronic Issues   HPI Pt with history of hypertension for more than 20 years.  She is currently taking diltiazem 240mg  qd - bp elevated today and was at last visit.  Is not taking bp at home She is agreeable to increase medication Denies chest pain or dyspnea  Pt would like to try Wegovy to help with weight loss.  She is at 14 BMI - she was denied that medication at last visit and started online therapy with [facility] online and currently taking semaglutide 30ml weekly from their pharmacy Her insurance has changed and would like to be prescribed wegovy  Pt requests refill of vaginal hormone cream and macrobid (which she uses as needed after intercourse)  Pt with history of vit D def - on weekly supplement  It is noted last labwork showed mildly elevated glucose - will order hgb a1c     06/21/2023   10:25 AM 12/20/2022   11:22 AM 09/14/2022   10:42 AM 06/12/2022    1:55 PM 10/23/2021    1:15 PM  Depression screen PHQ 2/9  Decreased Interest 0 0 0 0 0  Down, Depressed, Hopeless 0 0 0 0 0  PHQ - 2 Score 0 0 0 0 0  Altered sleeping 0 0  0 0  Tired, decreased energy 1 0  0 3  Change in appetite 0 0  0 0  Feeling bad or failure about yourself  0 2  0 0  Trouble concentrating 0 0  0 0  Moving slowly or fidgety/restless 0 0  0 0  Suicidal thoughts 0 0  0 0  PHQ-9 Score 1 2  0 3  Difficult doing work/chores Not difficult at all Not difficult at all  Not difficult at all Not difficult at all        10/23/2021    1:14 PM 06/12/2022    1:54 PM 09/14/2022   10:42 AM 12/20/2022   11:22 AM 06/21/2023   10:25 AM  Fall Risk  Falls in the past year? 0 0 0 0 0  Was there an injury with Fall? 0 0 0 0 0  Fall Risk Category Calculator 0 0 0 0 0  Fall Risk Category (Retired) Low Low     (RETIRED) Patient Fall Risk Level Low fall  risk Low fall risk     Patient at Risk for Falls Due to No Fall Risks No Fall Risks No Fall Risks No Fall Risks No Fall Risks  Fall risk Follow up  Falls evaluation completed Falls evaluation completed Falls prevention discussed Falls evaluation completed    CONSTITUTIONAL: Negative for chills, fatigue, fever, unintentional weight gain and unintentional weight loss.  CARDIOVASCULAR: Negative for chest pain, dizziness, palpitations and pedal edema.  RESPIRATORY: Negative for recent cough and dyspnea.  GASTROINTESTINAL: Negative for abdominal pain, acid reflux symptoms, constipation, diarrhea, nausea and vomiting.  MSK: Negative for arthralgias and myalgias.  INTEGUMENTARY: Negative for rash.  NEUROLOGICAL: Negative for dizziness and headaches.  PSYCHIATRIC: Negative for sleep disturbance and to question depression screen.  Negative for depression, negative for anhedonia.       Current Outpatient Medications:    Cyanocobalamin (VITAMIN B-12 PO), Take by mouth., Disp: , Rfl:    diltiazem (CARDIZEM CD) 360 MG 24 hr capsule, Take 1 capsule (360 mg total) by  mouth daily., Disp: 90 capsule, Rfl: 0   fluticasone (FLONASE) 50 MCG/ACT nasal spray, SPRAY 1 SPRAY INTO BOTH NOSTRILS DAILY., Disp: 16 mL, Rfl: 6   meloxicam (MOBIC) 15 MG tablet, TAKE 1 TABLET (15 MG TOTAL) BY MOUTH DAILY., Disp: 30 tablet, Rfl: 0   Multiple Vitamin (MULTIVITAMIN WITH MINERALS) TABS tablet, Take 1 tablet by mouth daily., Disp: , Rfl:    Semaglutide-Weight Management (WEGOVY) 0.5 MG/0.5ML SOAJ, Inject 0.5 mg into the skin once a week., Disp: 2 mL, Rfl: 0   Vitamin D, Ergocalciferol, (DRISDOL) 1.25 MG (50000 UNIT) CAPS capsule, TAKE 1 CAPSULE (50,000 UNITS TOTAL) BY MOUTH EVERY 7 (SEVEN) DAYS, Disp: 4 capsule, Rfl: 4   estradiol (ESTRACE) 0.1 MG/GM vaginal cream, Place 1 Applicatorful vaginally at bedtime., Disp: 42.5 g, Rfl: 1   nitrofurantoin (MACRODANTIN) 50 MG capsule, Take 1 capsule (50 mg total) by mouth as needed (UTI  SYMPTOMS)., Disp: 20 capsule, Rfl: 1  Past Medical History:  Diagnosis Date   Allergy    Breast cancer (HCC)    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   Complication of anesthesia    hiccups    Dysplastic nevus 06/16/2020   L buttocks   Frequent urinary tract infections    GERD (gastroesophageal reflux disease) 2020   Otc alka seltzer   Hypertension    Objective:  PHYSICAL EXAM:   VS: BP (!) 150/86   Pulse 74   Temp 97.7 F (36.5 C) (Temporal)   Ht 5\' 9"  (1.753 m)   Wt 224 lb (101.6 kg)   LMP 11/14/2018 (Exact Date)   SpO2 98%   BMI 33.08 kg/m   GEN: Well nourished, well developed, in no acute distress  HEENT: normal external ears and nose - normal external auditory canals and TMS - - Lips, Teeth and Gums - normal   Cardiac: RRR; no murmurs, rubs, or gallops,no edema -  Respiratory:  normal respiratory rate and pattern with no distress - normal breath sounds with no rales, rhonchi, wheezes or rubs  Skin: warm and dry, no rash  Neuro:  Alert and Oriented x 3,  - CN II-Xii grossly intact Psych: euthymic mood, appropriate affect and demeanor   Assessment & Plan:    Mixed hyperlipidemia -     Lipid panel Continue to watch diet Essential hypertension -     CBC with Differential/Platelet -     Comprehensive metabolic panel -     TSH Increase cardizem to 360mg  qd Vitamin D deficiency -     VITAMIN D 25 Hydroxy (Vit-D Deficiency, Fractures) Continue supplement   Class 1 obesity with comorbidity Watch diet Rx for wegovy   Follow-up: Return in about 3 months (around 09/19/2023) for follow-up.  An After Visit Summary was printed and given to the patient.  Jettie Pagan [facility] [phone]

## 2023-06-22 LAB — COMPREHENSIVE METABOLIC PANEL
ALT: 20 [IU]/L (ref 0–32)
AST: 19 [IU]/L (ref 0–40)
Albumin: 4.8 g/dL (ref 3.8–4.9)
Alkaline Phosphatase: 111 [IU]/L (ref 44–121)
BUN/Creatinine Ratio: 17 (ref 9–23)
BUN: 16 mg/dL (ref 6–24)
Bilirubin Total: 0.3 mg/dL (ref 0.0–1.2)
CO2: 23 mmol/L (ref 20–29)
Calcium: 9.9 mg/dL (ref 8.7–10.2)
Chloride: 99 mmol/L (ref 96–106)
Creatinine, Ser: 0.96 mg/dL (ref 0.57–1.00)
Globulin, Total: 2.6 g/dL (ref 1.5–4.5)
Glucose: 79 mg/dL (ref 70–99)
Potassium: 4.7 mmol/L (ref 3.5–5.2)
Sodium: 137 mmol/L (ref 134–144)
Total Protein: 7.4 g/dL (ref 6.0–8.5)
eGFR: 69 mL/min/{1.73_m2} (ref >59)

## 2023-06-22 LAB — CBC WITH DIFFERENTIAL/PLATELET
Basophils Absolute: 0.1 10*3/uL (ref 0.0–0.2)
Basos: 1 %
EOS (ABSOLUTE): 0.1 10*3/uL (ref 0.0–0.4)
Eos: 1 %
Hematocrit: 47.2 % — ABNORMAL HIGH (ref 34.0–46.6)
Hemoglobin: 15.8 g/dL (ref 11.1–15.9)
Immature Grans (Abs): 0 10*3/uL (ref 0.0–0.1)
Immature Granulocytes: 0 %
Lymphocytes Absolute: 2.1 10*3/uL (ref 0.7–3.1)
Lymphs: 28 %
MCH: 29 pg (ref 26.6–33.0)
MCHC: 33.5 g/dL (ref 31.5–35.7)
MCV: 87 fL (ref 79–97)
Monocytes Absolute: 0.5 10*3/uL (ref 0.1–0.9)
Monocytes: 6 %
Neutrophils Absolute: 4.7 10*3/uL (ref 1.4–7.0)
Neutrophils: 64 %
Platelets: 290 10*3/uL (ref 150–450)
RBC: 5.45 x10E6/uL — ABNORMAL HIGH (ref 3.77–5.28)
RDW: 13.7 % (ref 11.7–15.4)
WBC: 7.4 10*3/uL (ref 3.4–10.8)

## 2023-06-22 LAB — LIPID PANEL
Chol/HDL Ratio: 3.1 {ratio} (ref 0.0–4.4)
Cholesterol, Total: 187 mg/dL (ref 100–199)
HDL: 61 mg/dL (ref >39)
LDL Chol Calc (NIH): 109 mg/dL — ABNORMAL HIGH (ref 0–99)
Triglycerides: 92 mg/dL (ref 0–149)
VLDL Cholesterol Cal: 17 mg/dL (ref 5–40)

## 2023-06-22 LAB — TSH: TSH: 0.839 u[IU]/mL (ref 0.450–4.500)

## 2023-06-22 LAB — VITAMIN D 25 HYDROXY (VIT D DEFICIENCY, FRACTURES): Vit D, 25-Hydroxy: 59.6 ng/mL (ref 30.0–100.0)

## 2023-07-03 ENCOUNTER — Other Ambulatory Visit: Payer: Self-pay | Admitting: Podiatry

## 2023-07-17 DIAGNOSIS — Z419 Encounter for procedure for purposes other than remedying health state, unspecified: Secondary | ICD-10-CM | POA: Diagnosis not present

## 2023-07-18 ENCOUNTER — Encounter: Payer: Self-pay | Admitting: Physician Assistant

## 2023-07-18 ENCOUNTER — Other Ambulatory Visit: Payer: Self-pay | Admitting: Physician Assistant

## 2023-07-18 MED ORDER — WEGOVY 1 MG/0.5ML ~~LOC~~ SOAJ: 1.0000 mg | SUBCUTANEOUS | 0 refills | Status: DC

## 2023-08-16 ENCOUNTER — Other Ambulatory Visit: Payer: Self-pay | Admitting: Physician Assistant

## 2023-08-16 MED ORDER — WEGOVY 1 MG/0.5ML ~~LOC~~ SOAJ: 1.0000 mg | SUBCUTANEOUS | 0 refills | Status: DC

## 2023-08-17 DIAGNOSIS — Z419 Encounter for procedure for purposes other than remedying health state, unspecified: Secondary | ICD-10-CM | POA: Diagnosis not present

## 2023-09-11 ENCOUNTER — Encounter: Payer: Self-pay | Admitting: Physician Assistant

## 2023-09-11 ENCOUNTER — Other Ambulatory Visit: Payer: Self-pay | Admitting: Physician Assistant

## 2023-09-11 DIAGNOSIS — E559 Vitamin D deficiency, unspecified: Secondary | ICD-10-CM

## 2023-09-11 MED ORDER — WEGOVY 1.7 MG/0.75ML ~~LOC~~ SOAJ: 1.7000 mg | SUBCUTANEOUS | 0 refills | Status: DC

## 2023-09-14 DIAGNOSIS — Z419 Encounter for procedure for purposes other than remedying health state, unspecified: Secondary | ICD-10-CM | POA: Diagnosis not present

## 2023-09-16 ENCOUNTER — Other Ambulatory Visit: Payer: Self-pay | Admitting: Physician Assistant

## 2023-09-16 DIAGNOSIS — I10 Essential (primary) hypertension: Secondary | ICD-10-CM

## 2023-09-20 ENCOUNTER — Ambulatory Visit: Payer: Medicaid Other | Admitting: Physician Assistant

## 2023-09-25 ENCOUNTER — Ambulatory Visit: Admitting: Physician Assistant

## 2023-09-25 ENCOUNTER — Other Ambulatory Visit: Payer: Self-pay | Admitting: Physician Assistant

## 2023-09-25 ENCOUNTER — Encounter: Payer: Self-pay | Admitting: Physician Assistant

## 2023-09-25 VITALS — BP 138/84 | HR 85 | Temp 97.8°F | Resp 14 | Ht 69.0 in | Wt 211.0 lb

## 2023-09-25 DIAGNOSIS — B9689 Other specified bacterial agents as the cause of diseases classified elsewhere: Secondary | ICD-10-CM | POA: Insufficient documentation

## 2023-09-25 DIAGNOSIS — F419 Anxiety disorder, unspecified: Secondary | ICD-10-CM | POA: Insufficient documentation

## 2023-09-25 DIAGNOSIS — E66811 Obesity, class 1: Secondary | ICD-10-CM

## 2023-09-25 DIAGNOSIS — N760 Acute vaginitis: Secondary | ICD-10-CM

## 2023-09-25 DIAGNOSIS — E661 Drug-induced obesity: Secondary | ICD-10-CM | POA: Insufficient documentation

## 2023-09-25 DIAGNOSIS — Z6833 Body mass index (BMI) 33.0-33.9, adult: Secondary | ICD-10-CM

## 2023-09-25 DIAGNOSIS — I10 Essential (primary) hypertension: Secondary | ICD-10-CM

## 2023-09-25 HISTORY — DX: Acute vaginitis: N76.0

## 2023-09-25 HISTORY — DX: Drug-induced obesity: E66.1

## 2023-09-25 HISTORY — DX: Anxiety disorder, unspecified: F41.9

## 2023-09-25 MED ORDER — METRONIDAZOLE 0.75 % VA GEL: 1.0000 | Freq: Two times a day (BID) | VAGINAL | 0 refills | Status: DC

## 2023-09-25 MED ORDER — BUPROPION HCL ER (XL) 150 MG PO TB24: 150.0000 mg | ORAL_TABLET | Freq: Every day | ORAL | 3 refills | Status: DC

## 2023-09-25 NOTE — Progress Notes (Signed)
 Subjective:  Patient ID: Kirsten James, female    DOB: 11-Nov-1965  Age: 58 y.o. MRN: [id number]  Chief Complaint  Patient presents with   Weight Check   HPI Pt with history of hypertension for more than 20 years.  She is currently taking diltiazem 360mg  qd.  She states she has had some elevated readings at home Denies chest pain or dyspnea  Pt requests to  restart wellbutrin that she used to be on in the past for anxiety - she would also like to restart med to see if it will help her stop vaping  Pt requests rx for bacterial vaginosis - has had mild odor with slight discharge and using boric acid suppositories with minimal relief  Pt in for follow up of weight management - is doing well on wegovy - currently on 1.7mg  weekly dose - has lost 13 pounds since last visit      06/21/2023   10:25 AM 12/20/2022   11:22 AM 09/14/2022   10:42 AM 06/12/2022    1:55 PM 10/23/2021    1:15 PM  Depression screen PHQ 2/9  Decreased Interest 0 0 0 0 0  Down, Depressed, Hopeless 0 0 0 0 0  PHQ - 2 Score 0 0 0 0 0  Altered sleeping 0 0  0 0  Tired, decreased energy 1 0  0 3  Change in appetite 0 0  0 0  Feeling bad or failure about yourself  0 2  0 0  Trouble concentrating 0 0  0 0  Moving slowly or fidgety/restless 0 0  0 0  Suicidal thoughts 0 0  0 0  PHQ-9 Score 1 2  0 3  Difficult doing work/chores Not difficult at all Not difficult at all  Not difficult at all Not difficult at all        10/23/2021    1:14 PM 06/12/2022    1:54 PM 09/14/2022   10:42 AM 12/20/2022   11:22 AM 06/21/2023   10:25 AM  Fall Risk  Falls in the past year? 0 0 0 0 0  Was there an injury with Fall? 0 0 0 0 0  Fall Risk Category Calculator 0 0 0 0 0  Fall Risk Category (Retired) Low Low     (RETIRED) Patient Fall Risk Level Low fall risk Low fall risk     Patient at Risk for Falls Due to No Fall Risks No Fall Risks No Fall Risks No Fall Risks No Fall Risks  Fall risk Follow up  Falls evaluation completed Falls  evaluation completed Falls prevention discussed Falls evaluation completed    CONSTITUTIONAL: Negative for chills, fatigue, fever, unintentional weight gain and unintentional weight loss.  E/N/T: Negative for ear pain, nasal congestion and sore throat.  CARDIOVASCULAR: Negative for chest pain, dizziness, palpitations and pedal edema.  RESPIRATORY: Negative for recent cough and dyspnea.  GASTROINTESTINAL: Negative for abdominal pain, acid reflux symptoms, constipation, diarrhea, nausea and vomiting.  GU - see HPI PSYCHIATRIC: see HPI      Current Outpatient Medications:    buPROPion (WELLBUTRIN XL) 150 MG 24 hr tablet, Take 1 tablet (150 mg total) by mouth daily., Disp: 30 tablet, Rfl: 3   Cyanocobalamin (VITAMIN B-12 PO), Take by mouth., Disp: , Rfl:    diltiazem (CARDIZEM CD) 360 MG 24 hr capsule, TAKE 1 CAPSULE BY MOUTH EVERY DAY, Disp: 90 capsule, Rfl: 0   estradiol (ESTRACE) 0.1 MG/GM vaginal cream, Place 1 Applicatorful vaginally at bedtime., Disp:  42.5 g, Rfl: 1   fluticasone (FLONASE) 50 MCG/ACT nasal spray, SPRAY 1 SPRAY INTO BOTH NOSTRILS DAILY., Disp: 16 mL, Rfl: 6   meloxicam (MOBIC) 15 MG tablet, TAKE 1 TABLET (15 MG TOTAL) BY MOUTH DAILY., Disp: 30 tablet, Rfl: 0   metroNIDAZOLE (METROGEL) 0.75 % vaginal gel, Place 1 Applicatorful vaginally 2 (two) times daily. For 5 days, Disp: 70 g, Rfl: 0   Multiple Vitamin (MULTIVITAMIN WITH MINERALS) TABS tablet, Take 1 tablet by mouth daily., Disp: , Rfl:    nitrofurantoin (MACRODANTIN) 50 MG capsule, Take 1 capsule (50 mg total) by mouth as needed (UTI SYMPTOMS)., Disp: 20 capsule, Rfl: 1   Semaglutide-Weight Management (WEGOVY) 1.7 MG/0.75ML SOAJ, Inject 1.7 mg into the skin once a week., Disp: 3 mL, Rfl: 0   Vitamin D, Ergocalciferol, (DRISDOL) 1.25 MG (50000 UNIT) CAPS capsule, TAKE 1 CAPSULE (50,000 UNITS TOTAL) BY MOUTH EVERY 7 (SEVEN) DAYS, Disp: 12 capsule, Rfl: 1  Past Medical History:  Diagnosis Date   Allergy    Breast cancer  (HCC)    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   Complication of anesthesia    hiccups    Dysplastic nevus 06/16/2020   L buttocks   Frequent urinary tract infections    GERD (gastroesophageal reflux disease) 2020   Otc alka seltzer   Hypertension    Objective:  PHYSICAL EXAM:   VS: BP 138/84   Pulse 85   Temp 97.8 F (36.6 C)   Resp 14   Ht 5\' 9"  (1.753 m)   Wt 211 lb (95.7 kg)   LMP 11/14/2018 (Exact Date)   SpO2 99%   BMI 31.16 kg/m   GEN: Well nourished, well developed, in no acute distress   Cardiac: RRR; no murmurs, rubs, or gallops,no edema - Respiratory:  normal respiratory rate and pattern with no distress - normal breath sounds with no rales, rhonchi, wheezes or rubs  MS: no deformity or atrophy  Skin: warm and dry, no rash  Neuro:  Alert and Oriented x 3, - CN II-Xii grossly intact Psych: euthymic mood, appropriate affect and demeanor   Assessment & Plan:    Essential hypertension Continue current med Watch diet Recheck bp 4 weeks nurse visit  Bacterial vaginosis Rx for metrogel  GAD Restart wellbutrin XL 150mg  qd  Class 1 obesity with comorbidity Continue wegovy   Follow-up: Return in about 4 months (around 01/25/2024) for chronic fasting follow-up - nurse visit 1 month bp check.  An After Visit Summary was printed and given to the patient.  Jettie Pagan [facility] [phone]

## 2023-10-07 ENCOUNTER — Other Ambulatory Visit: Payer: Self-pay | Admitting: Physician Assistant

## 2023-10-07 NOTE — Telephone Encounter (Signed)
 Please ask pt if she is ready to move up to next dose

## 2023-10-07 NOTE — Telephone Encounter (Signed)
 Called patient and left message for patient, and sent message through [facility]

## 2023-10-17 ENCOUNTER — Other Ambulatory Visit: Payer: Self-pay | Admitting: Physician Assistant

## 2023-10-17 DIAGNOSIS — F419 Anxiety disorder, unspecified: Secondary | ICD-10-CM

## 2023-10-25 ENCOUNTER — Ambulatory Visit

## 2023-10-25 VITALS — BP 142/92

## 2023-10-25 DIAGNOSIS — I10 Essential (primary) hypertension: Secondary | ICD-10-CM

## 2023-10-25 MED ORDER — LOSARTAN POTASSIUM 25 MG PO TABS: 25.0000 mg | ORAL_TABLET | Freq: Every day | ORAL | 1 refills | Status: DC

## 2023-10-25 NOTE — Progress Notes (Signed)
 Patient is in office today for a nurse visit for Blood Pressure Check. Patient blood pressure was 138/98, Patient has no symptoms.  Marianne Sofia [facility] is not here today so consulted with Dr. Sedalia Muta and per Dr. Sedalia Muta patient needs to start Losartan 25 mg 1 tablet daily and follow up in 4 weeks for a nurse visit to recheck Blood pressure. Patient informed and understood verbally and scheduled for a 4 week bp follow up.

## 2023-10-26 DIAGNOSIS — Z419 Encounter for procedure for purposes other than remedying health state, unspecified: Secondary | ICD-10-CM | POA: Diagnosis not present

## 2023-10-28 ENCOUNTER — Encounter: Payer: Self-pay | Admitting: Physician Assistant

## 2023-11-02 ENCOUNTER — Other Ambulatory Visit: Payer: Self-pay | Admitting: Physician Assistant

## 2023-11-07 ENCOUNTER — Other Ambulatory Visit: Payer: Self-pay | Admitting: Physician Assistant

## 2023-11-07 ENCOUNTER — Encounter: Payer: Self-pay | Admitting: Physician Assistant

## 2023-11-07 DIAGNOSIS — E661 Drug-induced obesity: Secondary | ICD-10-CM

## 2023-11-07 MED ORDER — WEGOVY 2.4 MG/0.75ML ~~LOC~~ SOAJ: 2.4000 mg | SUBCUTANEOUS | 1 refills | Status: DC

## 2023-11-19 ENCOUNTER — Other Ambulatory Visit: Payer: Self-pay | Admitting: Family Medicine

## 2023-11-22 ENCOUNTER — Ambulatory Visit

## 2023-11-25 DIAGNOSIS — Z419 Encounter for procedure for purposes other than remedying health state, unspecified: Secondary | ICD-10-CM | POA: Diagnosis not present

## 2023-12-16 ENCOUNTER — Other Ambulatory Visit: Payer: Self-pay | Admitting: Physician Assistant

## 2023-12-16 DIAGNOSIS — I10 Essential (primary) hypertension: Secondary | ICD-10-CM

## 2023-12-16 DIAGNOSIS — J309 Allergic rhinitis, unspecified: Secondary | ICD-10-CM

## 2023-12-26 DIAGNOSIS — Z419 Encounter for procedure for purposes other than remedying health state, unspecified: Secondary | ICD-10-CM | POA: Diagnosis not present

## 2023-12-30 ENCOUNTER — Other Ambulatory Visit: Payer: Self-pay | Admitting: Physician Assistant

## 2023-12-30 DIAGNOSIS — E661 Drug-induced obesity: Secondary | ICD-10-CM

## 2024-01-02 ENCOUNTER — Telehealth: Payer: Self-pay

## 2024-01-02 NOTE — Telephone Encounter (Signed)
 Patient has been approved for Wegovy  2.4mg  through 06.18.2026.

## 2024-01-15 DIAGNOSIS — K036 Deposits [accretions] on teeth: Secondary | ICD-10-CM | POA: Diagnosis not present

## 2024-01-25 DIAGNOSIS — Z419 Encounter for procedure for purposes other than remedying health state, unspecified: Secondary | ICD-10-CM | POA: Diagnosis not present

## 2024-01-28 ENCOUNTER — Encounter: Payer: Self-pay | Admitting: Physician Assistant

## 2024-01-28 ENCOUNTER — Ambulatory Visit (INDEPENDENT_AMBULATORY_CARE_PROVIDER_SITE_OTHER): Admitting: Physician Assistant

## 2024-01-28 VITALS — BP 120/68 | HR 72 | Temp 98.3°F | Ht 69.0 in | Wt 207.6 lb

## 2024-01-28 DIAGNOSIS — E559 Vitamin D deficiency, unspecified: Secondary | ICD-10-CM

## 2024-01-28 DIAGNOSIS — H918X3 Other specified hearing loss, bilateral: Secondary | ICD-10-CM

## 2024-01-28 DIAGNOSIS — E782 Mixed hyperlipidemia: Secondary | ICD-10-CM

## 2024-01-28 DIAGNOSIS — I10 Essential (primary) hypertension: Secondary | ICD-10-CM | POA: Diagnosis not present

## 2024-01-28 DIAGNOSIS — R739 Hyperglycemia, unspecified: Secondary | ICD-10-CM | POA: Diagnosis not present

## 2024-01-28 DIAGNOSIS — R55 Syncope and collapse: Secondary | ICD-10-CM | POA: Diagnosis not present

## 2024-01-28 NOTE — Progress Notes (Signed)
 Subjective:  Patient ID: Kirsten James, female    DOB: [birth year]  Age: 58 y.o. MRN: [id number]  Chief Complaint  Patient presents with   Medical Management of Chronic Issues   HPI Pt with history of hypertension for more than 20 years and after last visit was started on losartan  25mg  qd.  She brings in bp readings today all of which are within normal range - 113-130/69-80s She is concerned because she has noted with moderate exertion she has been having symptoms of feeling lightheaded, fatigued and almost to the point of passing out.  She has noted this a few times with working in yard. - Says the symptoms last about 5 minutes and then resolve after sitting, resting and cooling off She denies chest pain or dyspnea  Pt is currently on wegovy  for weight loss.  Although she has lost almost 20 pounds since 12/24 she feels like wegovy  not helping anymore - states she does not feel like it is controlling her appetite.  She states she is trying to walk daily and get her heartrate up and is watching her diet.  Explained that this medication seems to be helping her lose weight steadily and to continue to adjust diet/exercise.  She will check with insurance and consider change to Zepbound possibly  Pt states she has noted she has been having decreased hearing.  Thought it was due to allergies but now is having muffled sounds in ears - would like ENT referral  Pt with vit D def - due for labwork - taking supplement    01/28/2024    9:17 AM 06/21/2023   10:25 AM 12/20/2022   11:22 AM 09/14/2022   10:42 AM 06/12/2022    1:55 PM  Depression screen PHQ 2/9  Decreased Interest 0 0 0 0 0  Down, Depressed, Hopeless 0 0 0 0 0  PHQ - 2 Score 0 0 0 0 0  Altered sleeping  0 0  0  Tired, decreased energy  1 0  0  Change in appetite  0 0  0  Feeling bad or failure about yourself   0 2  0  Trouble concentrating  0 0  0  Moving slowly or fidgety/restless  0 0  0  Suicidal thoughts  0 0  0  PHQ-9 Score  1  2  0  Difficult doing work/chores  Not difficult at all Not difficult at all  Not difficult at all        06/12/2022    1:54 PM 09/14/2022   10:42 AM 12/20/2022   11:22 AM 06/21/2023   10:25 AM 01/28/2024    9:17 AM  Fall Risk  Falls in the past year? 0 0 0 0 0  Was there an injury with Fall? 0 0 0 0 0  Fall Risk Category Calculator 0 0 0 0 0  Fall Risk Category (Retired) Low       (RETIRED) Patient Fall Risk Level Low fall risk       Patient at Risk for Falls Due to No Fall Risks No Fall Risks No Fall Risks No Fall Risks No Fall Risks  Fall risk Follow up Falls evaluation completed  Falls evaluation completed Falls prevention discussed Falls evaluation completed      Data saved with a previous flowsheet row definition    CONSTITUTIONAL: see  HPI E/N/T: see HPI CARDIOVASCULAR: see HPI RESPIRATORY: Negative for recent cough and dyspnea.  GASTROINTESTINAL: Negative for abdominal pain, acid reflux symptoms, constipation,  diarrhea, nausea and vomiting.  MSK: Negative for arthralgias and myalgias.  INTEGUMENTARY: Negative for rash.  NEUROLOGICAL: Negative for dizziness and headaches.  PSYCHIATRIC: Negative for sleep disturbance and to question depression screen.  Negative for depression, negative for anhedonia.       Current Outpatient Medications:    Cyanocobalamin (VITAMIN B-12 PO), Take by mouth., Disp: , Rfl:    diltiazem  (CARDIZEM  CD) 360 MG 24 hr capsule, TAKE 1 CAPSULE BY MOUTH EVERY DAY, Disp: 90 capsule, Rfl: 0   estradiol  (ESTRACE ) 0.1 MG/GM vaginal cream, Place 1 Applicatorful vaginally at bedtime., Disp: 42.5 g, Rfl: 1   losartan  (COZAAR ) 25 MG tablet, TAKE 1 TABLET (25 MG TOTAL) BY MOUTH DAILY., Disp: 90 tablet, Rfl: 1   Multiple Vitamin (MULTIVITAMIN WITH MINERALS) TABS tablet, Take 1 tablet by mouth daily., Disp: , Rfl:    nitrofurantoin  (MACRODANTIN ) 50 MG capsule, TAKE 1 CAPSULE (50 MG TOTAL) BY MOUTH AS NEEDED (UTI SYMPTOMS)., Disp: 20 capsule, Rfl: 1    Semaglutide -Weight Management (WEGOVY ) 2.4 MG/0.75ML SOAJ, INJECT 2.4 MG INTO THE SKIN ONCE A WEEK., Disp: 3 mL, Rfl: 1   Vitamin D , Ergocalciferol , (DRISDOL ) 1.25 MG (50000 UNIT) CAPS capsule, TAKE 1 CAPSULE (50,000 UNITS TOTAL) BY MOUTH EVERY 7 (SEVEN) DAYS, Disp: 12 capsule, Rfl: 1   fluticasone  (FLONASE ) 50 MCG/ACT nasal spray, SPRAY 1 SPRAY INTO BOTH NOSTRILS DAILY. (Patient not taking: Reported on 01/28/2024), Disp: 48 mL, Rfl: 2  Past Medical History:  Diagnosis Date   Allergy    Breast cancer (HCC)    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   Complication of anesthesia    hiccups    Dysplastic nevus 06/16/2020   L buttocks   Frequent urinary tract infections    GERD (gastroesophageal reflux disease) 2020   Otc alka seltzer   Hypertension    Objective:  PHYSICAL EXAM:   VS: BP 120/68   Pulse 72   Temp 98.3 F (36.8 C)   Ht 5' 9 (1.753 m)   Wt 207 lb 9.6 oz (94.2 kg)   LMP 11/14/2018 (Exact Date)   SpO2 96%   BMI 30.66 kg/m   GEN: Well nourished, well developed, in no acute distress  HEENT: normal external ears and nose - normal external auditory canals and TMS - hearing grossly normal -- Lips, Teeth and Gums - normal  Oropharynx - normal mucosa, palate, and posterior pharynx Cardiac: RRR; no murmurs, rubs, or gallops,no edema  Respiratory:  normal respiratory rate and pattern with no distress - normal breath sounds with no rales, rhonchi, wheezes or rubs  Skin: warm and dry, no rash  Neuro:  Alert and Oriented x 3, - CN II-Xii grossly intact Psych: euthymic mood, appropriate affect and demeanor   Assessment & Plan:    Essential hypertension Continue current med Watch diet Continue current meds  Class 1 obesity with comorbidity Continue wegovy   Vit D def Continue supplement Labwork pending  Syncope EKG Refer to cardiology Labwork pending  Hearing loss Refer to ENT Follow-up: Return in about 3 months (around 04/29/2024) for chronic  fasting follow-up.  An After Visit Summary was printed and given to the patient.  Kirsten James [facility] [phone]

## 2024-01-29 ENCOUNTER — Ambulatory Visit: Payer: Self-pay | Admitting: Physician Assistant

## 2024-01-29 ENCOUNTER — Other Ambulatory Visit: Payer: Self-pay

## 2024-01-29 DIAGNOSIS — T8859XA Other complications of anesthesia, initial encounter: Secondary | ICD-10-CM | POA: Insufficient documentation

## 2024-01-29 DIAGNOSIS — I10 Essential (primary) hypertension: Secondary | ICD-10-CM | POA: Insufficient documentation

## 2024-01-29 DIAGNOSIS — N390 Urinary tract infection, site not specified: Secondary | ICD-10-CM | POA: Insufficient documentation

## 2024-01-29 DIAGNOSIS — C50919 Malignant neoplasm of unspecified site of unspecified female breast: Secondary | ICD-10-CM | POA: Insufficient documentation

## 2024-01-29 DIAGNOSIS — C801 Malignant (primary) neoplasm, unspecified: Secondary | ICD-10-CM | POA: Insufficient documentation

## 2024-01-29 DIAGNOSIS — K219 Gastro-esophageal reflux disease without esophagitis: Secondary | ICD-10-CM | POA: Insufficient documentation

## 2024-01-29 DIAGNOSIS — T7840XA Allergy, unspecified, initial encounter: Secondary | ICD-10-CM | POA: Insufficient documentation

## 2024-01-29 LAB — COMPREHENSIVE METABOLIC PANEL WITH GFR
ALT: 18 IU/L (ref 0–32)
AST: 17 IU/L (ref 0–40)
Albumin: 4.7 g/dL (ref 3.8–4.9)
Alkaline Phosphatase: 104 IU/L (ref 44–121)
BUN/Creatinine Ratio: 18 (ref 9–23)
BUN: 14 mg/dL (ref 6–24)
Bilirubin Total: 0.2 mg/dL (ref 0.0–1.2)
CO2: 22 mmol/L (ref 20–29)
Calcium: 9.8 mg/dL (ref 8.7–10.2)
Chloride: 100 mmol/L (ref 96–106)
Creatinine, Ser: 0.77 mg/dL (ref 0.57–1.00)
Globulin, Total: 2.7 g/dL (ref 1.5–4.5)
Glucose: 83 mg/dL (ref 70–99)
Potassium: 4.8 mmol/L (ref 3.5–5.2)
Sodium: 138 mmol/L (ref 134–144)
Total Protein: 7.4 g/dL (ref 6.0–8.5)
eGFR: 89 mL/min/1.73 (ref >59)

## 2024-01-29 LAB — CBC WITH DIFFERENTIAL/PLATELET
Basophils Absolute: 0.1 x10E3/uL (ref 0.0–0.2)
Basos: 1 %
EOS (ABSOLUTE): 0.1 x10E3/uL (ref 0.0–0.4)
Eos: 1 %
Hematocrit: 47.2 % — ABNORMAL HIGH (ref 34.0–46.6)
Hemoglobin: 15.1 g/dL (ref 11.1–15.9)
Immature Grans (Abs): 0 x10E3/uL (ref 0.0–0.1)
Immature Granulocytes: 0 %
Lymphocytes Absolute: 2.4 x10E3/uL (ref 0.7–3.1)
Lymphs: 30 %
MCH: 28.4 pg (ref 26.6–33.0)
MCHC: 32 g/dL (ref 31.5–35.7)
MCV: 89 fL (ref 79–97)
Monocytes Absolute: 0.5 x10E3/uL (ref 0.1–0.9)
Monocytes: 6 %
Neutrophils Absolute: 5 x10E3/uL (ref 1.4–7.0)
Neutrophils: 62 %
Platelets: 289 x10E3/uL (ref 150–450)
RBC: 5.31 x10E6/uL — ABNORMAL HIGH (ref 3.77–5.28)
RDW: 14 % (ref 11.7–15.4)
WBC: 8.1 x10E3/uL (ref 3.4–10.8)

## 2024-01-29 LAB — LIPID PANEL
Chol/HDL Ratio: 3.3 ratio (ref 0.0–4.4)
Cholesterol, Total: 196 mg/dL (ref 100–199)
HDL: 59 mg/dL (ref >39)
LDL Chol Calc (NIH): 120 mg/dL — ABNORMAL HIGH (ref 0–99)
Triglycerides: 92 mg/dL (ref 0–149)
VLDL Cholesterol Cal: 17 mg/dL (ref 5–40)

## 2024-01-29 LAB — HEMOGLOBIN A1C
Est. average glucose Bld gHb Est-mCnc: 105 mg/dL
Hgb A1c MFr Bld: 5.3 % (ref 4.8–5.6)

## 2024-01-29 LAB — TSH: TSH: 1.02 u[IU]/mL (ref 0.450–4.500)

## 2024-01-29 LAB — VITAMIN D 25 HYDROXY (VIT D DEFICIENCY, FRACTURES): Vit D, 25-Hydroxy: 64.2 ng/mL (ref 30.0–100.0)

## 2024-01-30 ENCOUNTER — Ambulatory Visit: Attending: Cardiology | Admitting: Cardiology

## 2024-01-30 ENCOUNTER — Other Ambulatory Visit: Payer: Self-pay | Admitting: Physician Assistant

## 2024-01-30 ENCOUNTER — Encounter: Payer: Self-pay | Admitting: Cardiology

## 2024-01-30 VITALS — BP 130/88 | HR 74 | Ht 69.0 in | Wt 208.8 lb

## 2024-01-30 DIAGNOSIS — Z87891 Personal history of nicotine dependence: Secondary | ICD-10-CM | POA: Diagnosis not present

## 2024-01-30 DIAGNOSIS — R011 Cardiac murmur, unspecified: Secondary | ICD-10-CM | POA: Insufficient documentation

## 2024-01-30 DIAGNOSIS — E785 Hyperlipidemia, unspecified: Secondary | ICD-10-CM | POA: Diagnosis not present

## 2024-01-30 DIAGNOSIS — I10 Essential (primary) hypertension: Secondary | ICD-10-CM | POA: Diagnosis not present

## 2024-01-30 DIAGNOSIS — Z79899 Other long term (current) drug therapy: Secondary | ICD-10-CM | POA: Insufficient documentation

## 2024-01-30 DIAGNOSIS — Z6830 Body mass index (BMI) 30.0-30.9, adult: Secondary | ICD-10-CM | POA: Diagnosis not present

## 2024-01-30 DIAGNOSIS — R0989 Other specified symptoms and signs involving the circulatory and respiratory systems: Secondary | ICD-10-CM | POA: Insufficient documentation

## 2024-01-30 DIAGNOSIS — E66811 Obesity, class 1: Secondary | ICD-10-CM | POA: Diagnosis not present

## 2024-01-30 MED ORDER — LOSARTAN POTASSIUM 100 MG PO TABS: 100.0000 mg | ORAL_TABLET | Freq: Every day | ORAL | 3 refills | Status: DC

## 2024-01-30 NOTE — Patient Instructions (Addendum)
 Please keep a BP log for 2 weeks and send by MyChart or mail.                         Name and DOB__________________________ Dr. Edwyna [address] [place], [place] [address]  Blood Pressure Record Sheet To take your blood pressure, you will need a blood pressure machine. You can buy a blood pressure machine (blood pressure monitor) at your clinic, drug store, or online. When choosing one, consider: An automatic monitor that has an arm cuff. A cuff that wraps snugly around your upper arm. You should be able to fit only one finger between your arm and the cuff. A device that stores blood pressure reading results. Do not choose a monitor that measures your blood pressure from your wrist or finger. Follow your health care provider's instructions for how to take your blood pressure. To use this form: Get one reading in the morning (a.m.) 1-2 hours after you take any medicines. Get one reading in the evening (p.m.) before supper.   Blood pressure log Date: _______________________  a.m. _____________________(1st reading) HR___________            p.m. _____________________(2nd reading) HR__________  Date: _______________________  a.m. _____________________(1st reading) HR___________            p.m. _____________________(2nd reading) HR__________  Date: _______________________  a.m. _____________________(1st reading) HR___________            p.m. _____________________(2nd reading) HR__________  Date: _______________________  a.m. _____________________(1st reading) HR___________            p.m. _____________________(2nd reading) HR__________  Date: _______________________  a.m. _____________________(1st reading) HR___________            p.m. _____________________(2nd reading) HR__________  Date: _______________________  a.m. _____________________(1st reading) HR___________            p.m. _____________________(2nd reading) HR__________  Date: _______________________  a.m.  _____________________(1st reading) HR___________            p.m. _____________________(2nd reading) HR__________   This information is not intended to replace advice given to you by your health care provider. Make sure you discuss any questions you have with your health care provider. Document Revised: 10/21/2019 Document Reviewed: 10/21/2019 Elsevier Patient Education  2021 Elsevier Inc.   Medication Instructions:  Your physician has recommended you make the following change in your medication:   Stop Cardizem   Increase your Losartan  to 100 mg daily.  *If you need a refill on your cardiac medications before your next appointment, please call your pharmacy*   Lab Work: None ordered If you have labs (blood work) drawn today and your tests are completely normal, you will receive your results only by: MyChart Message (if you have MyChart) OR A paper copy in the mail If you have any lab test that is abnormal or we need to change your treatment, we will call you to review the results.  Testing/Procedures: Your physician has requested that you have an echocardiogram. Echocardiography is a painless test that uses sound waves to create images of your heart. It provides your doctor with information about the size and shape of your heart and how well your heart's chambers and valves are working. This procedure takes approximately one hour. There are no restrictions for this procedure. Please do NOT wear cologne, perfume, aftershave, or lotions (deodorant is allowed). Please arrive 15 minutes prior to your appointment time.  Please note: We ask at that you not bring children with  you during ultrasound (echo/ vascular) testing. Due to room size and safety concerns, children are not allowed in the ultrasound rooms during exams. Our front office staff cannot provide observation of children in our lobby area while testing is being conducted. An adult accompanying a patient to their appointment will  only be allowed in the ultrasound room at the discretion of the ultrasound technician under special circumstances. We apologize for any inconvenience.  We will order CT coronary calcium  score. It will cost $99.00 and is due at time of scan.  Please call to schedule.    [facility] [address] [place], [place] [address] [phone]  Your physician has requested that you have a renal artery duplex. During this test, an ultrasound is used to evaluate blood flow to the kidneys. Allow one hour for this exam. Do not eat after midnight the day before and avoid carbonated beverages. Take your medications as you usually do.  Follow-Up: At [facility], you and your health needs are our priority.  As part of our continuing mission to provide you with exceptional heart care, we have created designated Provider Care Teams.  These Care Teams include your primary Cardiologist (physician) and Advanced Practice Providers (APPs -  Physician Assistants and Nurse Practitioners) who all work together to provide you with the care you need, when you need it.  We recommend signing up for the patient portal called MyChart.  Sign up information is provided on this After Visit Summary.  MyChart is used to connect with patients for Virtual Visits (Telemedicine).  Patients are able to view lab/test results, encounter notes, upcoming appointments, etc.  Non-urgent messages can be sent to your provider as well.   To learn more about what you can do with MyChart, go to [email or web address].    Your next appointment:   9 month(s)  The format for your next appointment:   In Person  Provider:   Jennifer Crape, MD   Other Instructions Echocardiogram An echocardiogram is a test that uses sound waves (ultrasound) to produce images of the heart. Images from an echocardiogram can provide important information about: Heart size and shape. The size and thickness and movement  of your heart's walls. Heart muscle function and strength. Heart valve function or if you have stenosis. Stenosis is when the heart valves are too narrow. If blood is flowing backward through the heart valves (regurgitation). A tumor or infectious growth around the heart valves. Areas of heart muscle that are not working well because of poor blood flow or injury from a heart attack. Aneurysm detection. An aneurysm is a weak or damaged part of an artery wall. The wall bulges out from the normal force of blood pumping through the body. Tell a health care provider about: Any allergies you have. All medicines you are taking, including vitamins, herbs, eye drops, creams, and over-the-counter medicines. Any blood disorders you have. Any surgeries you have had. Any medical conditions you have. Whether you are pregnant or may be pregnant. What are the risks? Generally, this is a safe test. However, problems may occur, including an allergic reaction to dye (contrast) that may be used during the test. What happens before the test? No specific preparation is needed. You may eat and drink normally. What happens during the test? You will take off your clothes from the waist up and put on a hospital gown. Electrodes or electrocardiogram (ECG)patches may be placed on your chest. The electrodes or patches are then  connected to a device that monitors your heart rate and rhythm. You will lie down on a table for an ultrasound exam. A gel will be applied to your chest to help sound waves pass through your skin. A handheld device, called a transducer, will be pressed against your chest and moved over your heart. The transducer produces sound waves that travel to your heart and bounce back (or echo back) to the transducer. These sound waves will be captured in real-time and changed into images of your heart that can be viewed on a video monitor. The images will be recorded on a computer and reviewed by your health  care provider. You may be asked to change positions or hold your breath for a short time. This makes it easier to get different views or better views of your heart. In some cases, you may receive contrast through an IV in one of your veins. This can improve the quality of the pictures from your heart. The procedure may vary among health care providers and hospitals.   What can I expect after the test? You may return to your normal, everyday life, including diet, activities, and medicines, unless your health care provider tells you not to do that. Follow these instructions at home: It is up to you to get the results of your test. Ask your health care provider, or the department that is doing the test, when your results will be ready. Keep all follow-up visits. This is important. Summary An echocardiogram is a test that uses sound waves (ultrasound) to produce images of the heart. Images from an echocardiogram can provide important information about the size and shape of your heart, heart muscle function, heart valve function, and other possible heart problems. You do not need to do anything to prepare before this test. You may eat and drink normally. After the echocardiogram is completed, you may return to your normal, everyday life, unless your health care provider tells you not to do that. This information is not intended to replace advice given to you by your health care provider. Make sure you discuss any questions you have with your health care provider. Document Revised: 02/23/2020 Document Reviewed: 02/23/2020 Elsevier Patient Education  2021 Elsevier Inc.   Important Information About Sugar

## 2024-01-30 NOTE — Progress Notes (Signed)
 Cardiology Office Note:    Date:  01/30/2024   ID:  Kirsten James, DOB 21-Apr-1966, MRN [id number]  PCP:  Nicholaus Credit, PA-C  Cardiologist:  Jennifer JONELLE Crape, MD   Referring MD: Nicholaus Credit, PA-C    ASSESSMENT:    1. Essential hypertension   2. Abdominal bruit   3. Obesity (BMI 30.0-34.9)   4. Cardiac murmur    PLAN:    In order of problems listed above:  Primary prevention stressed with the patient.  Importance of compliance with diet medication stressed and patient verbalized standing. Essential hypertension I feel that she is getting more medications than necessary for her blood pressure and that she is developing symptoms of orthostasis.  I mentioned to her that I would like her to switch her medicine to high dose of Cozaar  and stop Cardizem .  She is agreeable.  She has no issues with palpitations.  She has had PVCs in the very remote past.  Diet emphasized.  Salt intake issues discussed.  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  She is good with exercise and lifestyle modification. Mild dyslipidemia: I discussed this with her.  In view of risk factors I suggested calcium  score and she is agreeable. Abdominal bruit and history of longstanding hypertension: Will do renal arterial Doppler to rule out stenosis. Obesity: Weight reduction stressed diet emphasized and she promises to do better. Patient will be seen in follow-up appointment in 6 months or earlier if the patient has any concerns.    Medication Adjustments/Labs and Tests Ordered: Current medicines are reviewed at length with the patient today.  Concerns regarding medicines are outlined above.  No orders of the defined types were placed in this encounter.  No orders of the defined types were placed in this encounter.    History of Present Illness:    Kirsten James is a 58 y.o. female who is being seen today for the evaluation of essential hypertension at the request of Nicholaus Credit,  Kirsten James.  Patient is a pleasant 58 year old female.  She has past medical history of essential hypertension and obesity.  She is an active lady.  She walks about 2 miles a day on a regular basis.  She is doing well taking care of her health.  She has mildly elevated lipids.  She is here for assessment of blood pressure and to make sure it is regulated well.  She tells me that Cozaar  was added to her regimen and she has symptoms with suggest mild postural hypotension.  No dizziness no syncope.  At the time of my evaluation, the patient is alert awake oriented and in no distress.  Past Medical History:  Diagnosis Date   Allergic rhinitis 10/23/2021   Allergy    Anxiety 09/25/2023   Arthralgia 01/24/2022   Formatting of this note might be different from the original. Last Assessment & Plan: Formatting of this note might be different from the original. Pt currently taking ibuprofen 600 mg multiple times a day Advised she d/c and switch for celebrex  100 mg bid. Pt states mobic  caused memory loss.     Body mass index (BMI) of 33.0-33.9 in adult 09/15/2019   Breast cancer (HCC)    BV (bacterial vaginosis) 09/25/2023   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   Carcinoma of overlapping sites of right breast in female, estrogen receptor positive (HCC) 04/20/2020   Formatting of this note might be different from the original. Last Assessment & Plan:  Formatting of this note might be different from the original. #Right breast-invasive mammary carcinoma [2 foci-9:00-7 mm; 1:00-13 millimeter-ONCOTYPE- 11; risk of recurrence- 3% with endocrine with endocrine therapy alone.  No significant benefit from adjuvant chemotherapy.  Would not recommend adjuvant chemother   Chronic UTI 10/23/2021   Class 1 drug-induced obesity with serious comorbidity and body mass index (BMI) of 33.0 to 33.9 in adult 09/25/2023   Complication of anesthesia    hiccups    Dysplastic nevus 06/16/2020   L buttocks   Essential  hypertension 09/15/2019   Formatting of this note might be different from the original. Last Assessment & Plan: Formatting of this note might be different from the original. Elevated in office and at home . Would like to add losartan  25 mg, pt declines. Advised dash/low salt diet. Advised increase in exercise. Reviewed last cmp F/u 6 mo     Frequent urinary tract infections    GERD (gastroesophageal reflux disease) 2020   Otc alka seltzer   Herniated nucleus pulposus, L5-S1 04/30/2022   History of cancer of right breast 06/06/2020   Hypertension    Lumbar radiculitis 04/30/2022   Menopausal vasomotor syndrome 09/14/2022   Menopause 09/15/2019   Mixed hyperlipidemia 09/14/2022   S/P mastectomy, right 05/09/2020   Vitamin D  deficiency 09/15/2019    Past Surgical History:  Procedure Laterality Date   BREAST BIOPSY Right 04/05/2020   us  bx, Q shape marker 9:00,  INVASIVE MAMMARY CARCINOMA   BREAST BIOPSY Right 04/05/2020   us  bx, x shape marker 1:00, INVASIVE MAMMARY CARCINOMA   BREAST RECONSTRUCTION WITH PLACEMENT OF TISSUE EXPANDER AND FLEX HD (ACELLULAR HYDRATED DERMIS) Right 04/25/2020   Procedure: BREAST RECONSTRUCTION WITH PLACEMENT OF TISSUE EXPANDER AND FLEX HD (ACELLULAR HYDRATED DERMIS);  Surgeon: Elisabeth Craig RAMAN, MD;  Location: [facility];  Service: Plastics;  Laterality: Right;   CHOLECYSTECTOMY     L4-5 herniated disc repair     LIPOSUCTION WITH LIPOFILLING Right 11/15/2020   Procedure: Right breast fat grafting and excess skin excision;  Surgeon: Elisabeth Craig RAMAN, MD;  Location: [facility];  Service: Plastics;  Laterality: Right;   MASTECTOMY Right    MASTOPEXY Left 06/14/2020   Procedure: LEFT BREAST MASTOPEXY;  Surgeon: Elisabeth Craig RAMAN, MD;  Location: [facility];  Service: Plastics;  Laterality: Left;   NASAL SINUS SURGERY  2005   PLANTAR FASCIA RELEASE     REMOVAL OF TISSUE EXPANDER AND PLACEMENT OF IMPLANT Right 06/14/2020   Procedure:  REMOVAL OF TISSUE EXPANDER AND PLACEMENT OF IMPLANT;  Surgeon: Elisabeth Craig RAMAN, MD;  Location: [facility];  Service: Plastics;  Laterality: Right;   SIMPLE MASTECTOMY WITH AXILLARY SENTINEL NODE BIOPSY Right 04/25/2020   Procedure: SIMPLE MASTECTOMY WITH AXILLARY SENTINEL NODE BIOPSY;  Surgeon: Lane Shope, MD;  Location: [facility];  Service: General;  Laterality: Right;   SPINE SURGERY  2010   Microdiscrctomy L -4,5    Current Medications: Current Meds  Medication Sig   Cyanocobalamin (VITAMIN B-12 PO) Take 1 tablet by mouth daily.   diltiazem  (CARDIZEM  CD) 360 MG 24 hr capsule TAKE 1 CAPSULE BY MOUTH EVERY DAY   estradiol  (ESTRACE ) 0.1 MG/GM vaginal cream Place 1 Applicatorful vaginally at bedtime.   fluticasone  (FLONASE ) 50 MCG/ACT nasal spray SPRAY 1 SPRAY INTO BOTH NOSTRILS DAILY.   losartan  (COZAAR ) 25 MG tablet TAKE 1 TABLET (25 MG TOTAL) BY MOUTH DAILY.   Multiple Vitamin (MULTIVITAMIN WITH MINERALS) TABS tablet Take 1 tablet by  mouth daily.   nitrofurantoin  (MACRODANTIN ) 50 MG capsule TAKE 1 CAPSULE (50 MG TOTAL) BY MOUTH AS NEEDED (UTI SYMPTOMS).   Semaglutide -Weight Management (WEGOVY ) 2.4 MG/0.75ML SOAJ INJECT 2.4 MG INTO THE SKIN ONCE A WEEK.   Vitamin D , Ergocalciferol , (DRISDOL ) 1.25 MG (50000 UNIT) CAPS capsule TAKE 1 CAPSULE (50,000 UNITS TOTAL) BY MOUTH EVERY 7 (SEVEN) DAYS     Allergies:   Other, Amoxicillin, and Penicillins   Social History   Socioeconomic History   Marital status: Divorced    Spouse name: Not on file   Number of children: 2   Years of education: Not on file   Highest education level: Associate degree: occupational, Scientist, product/process development, or vocational program  Occupational History   Not on file  Tobacco Use   Smoking status: Former    Current packs/day: 0.00    Average packs/day: 0.5 packs/day for 15.0 years (7.5 ttl pk-yrs)    Types: Cigarettes, E-cigarettes    Start date: 10/11/2004    Quit date: 10/12/2019    Years since quitting:  4.3   Smokeless tobacco: Never   Tobacco comments:    1/2 pack for 15-20 yrs quit 1995 with occ vaping quit 09/2019  Vaping Use   Vaping status: Every Day   Start date: 10/28/2021   Substances: Nicotine, Flavoring  Substance and Sexual Activity   Alcohol use: Not Currently    Comment: Special occasions. Maybe 4 a year   Drug use: Never   Sexual activity: Yes    Partners: Male    Birth control/protection: None    Comment: Post menopausal  Other Topics Concern   Not on file  Social History Narrative   Quit smoking [teen- 1995; quit]; no alcohol. Lives in [place] by self; daughter in [place]. RN- Surveyor, quantity.    Social Drivers of Corporate investment banker Strain: Low Risk  (01/24/2024)   Overall Financial Resource Strain (CARDIA)    Difficulty of Paying Living Expenses: Not hard at all  Food Insecurity: No Food Insecurity (01/24/2024)   Hunger Vital Sign    Worried About Running Out of Food in the Last Year: Never true    Ran Out of Food in the Last Year: Never true  Transportation Needs: No Transportation Needs (01/24/2024)   PRAPARE - Administrator, Civil Service (Medical): No    Lack of Transportation (Non-Medical): No  Physical Activity: Sufficiently Active (01/24/2024)   Exercise Vital Sign    Days of Exercise per Week: 6 days    Minutes of Exercise per Session: 40 min  Stress: No Stress Concern Present (01/24/2024)   [employer] of Occupational Health - Occupational Stress Questionnaire    Feeling of Stress: Only a little  Social Connections: Moderately Isolated (01/24/2024)   Social Connection and Isolation Panel    Frequency of Communication with Friends and Family: More than three times a week    Frequency of Social Gatherings with Friends and Family: More than three times a week    Attends Religious Services: Never    Database administrator or Organizations: No    Attends Engineer, structural: Not on file    Marital  Status: Living with partner     Family History: The patient's family history includes Alcohol abuse in her father and mother; Alzheimer's disease in her maternal grandmother; Cancer in her father and mother; Melanoma in her maternal uncle; Stroke in her mother. There is no history of Breast cancer.  ROS:  Please see the history of present illness.    All other systems reviewed and are negative.  EKGs/Labs/Other Studies Reviewed:    The following studies were reviewed today: EKG reveals sinus rhythm with nonspecific ST-T changes       Recent Labs: 01/28/2024: ALT 18; BUN 14; Creatinine, Ser 0.77; Hemoglobin 15.1; Platelets 289; Potassium 4.8; Sodium 138; TSH 1.020  Recent Lipid Panel    Component Value Date/Time   CHOL 196 01/28/2024 0949   TRIG 92 01/28/2024 0949   HDL 59 01/28/2024 0949   CHOLHDL 3.3 01/28/2024 0949   LDLCALC 120 (H) 01/28/2024 0949    Physical Exam:    VS:  BP 130/88   Pulse 74   Ht 5' 9 (1.753 m)   Wt 208 lb 12.8 oz (94.7 kg)   LMP 11/14/2018 (Exact Date)   SpO2 95%   BMI 30.83 kg/m     Wt Readings from Last 3 Encounters:  01/30/24 208 lb 12.8 oz (94.7 kg)  01/28/24 207 lb 9.6 oz (94.2 kg)  09/25/23 211 lb (95.7 kg)     GEN: Patient is in no acute distress HEENT: Normal NECK: No JVD; No carotid bruits LYMPHATICS: No lymphadenopathy CARDIAC: S1 S2 regular, 2/6 systolic murmur at the apex. RESPIRATORY:  Clear to auscultation without rales, wheezing or rhonchi  ABDOMEN: Soft, non-tender, non-distended.  Abdominal bruit noted. MUSCULOSKELETAL:  No edema; No deformity  SKIN: Warm and dry NEUROLOGIC:  Alert and oriented x 3 PSYCHIATRIC:  Normal affect    Signed, Jennifer JONELLE Crape, MD  01/30/2024 3:25 PM    [facility]

## 2024-02-03 ENCOUNTER — Ambulatory Visit (HOSPITAL_BASED_OUTPATIENT_CLINIC_OR_DEPARTMENT_OTHER)
Admission: RE | Admit: 2024-02-03 | Discharge: 2024-02-03 | Disposition: A | Discharge status: Home / Self Care | Payer: Self-pay | Source: Ambulatory Visit | Attending: Cardiology | Admitting: Cardiology

## 2024-02-03 DIAGNOSIS — I10 Essential (primary) hypertension: Secondary | ICD-10-CM

## 2024-02-04 ENCOUNTER — Ambulatory Visit: Payer: Self-pay | Admitting: Cardiology

## 2024-02-04 DIAGNOSIS — K559 Vascular disorder of intestine, unspecified: Secondary | ICD-10-CM

## 2024-02-07 ENCOUNTER — Encounter: Payer: Self-pay | Admitting: Cardiology

## 2024-02-20 ENCOUNTER — Encounter

## 2024-02-20 ENCOUNTER — Other Ambulatory Visit

## 2024-02-24 ENCOUNTER — Other Ambulatory Visit: Payer: Self-pay | Admitting: Physician Assistant

## 2024-02-24 DIAGNOSIS — E559 Vitamin D deficiency, unspecified: Secondary | ICD-10-CM

## 2024-02-25 DIAGNOSIS — Z419 Encounter for procedure for purposes other than remedying health state, unspecified: Secondary | ICD-10-CM | POA: Diagnosis not present

## 2024-02-28 ENCOUNTER — Other Ambulatory Visit: Payer: Self-pay | Admitting: Physician Assistant

## 2024-02-28 DIAGNOSIS — E661 Drug-induced obesity: Secondary | ICD-10-CM

## 2024-03-07 ENCOUNTER — Other Ambulatory Visit: Payer: Self-pay | Admitting: Medical Genetics

## 2024-03-12 ENCOUNTER — Ambulatory Visit: Attending: Cardiology

## 2024-03-12 ENCOUNTER — Other Ambulatory Visit: Payer: Self-pay | Admitting: Physician Assistant

## 2024-03-12 ENCOUNTER — Ambulatory Visit (INDEPENDENT_AMBULATORY_CARE_PROVIDER_SITE_OTHER)

## 2024-03-12 DIAGNOSIS — R0989 Other specified symptoms and signs involving the circulatory and respiratory systems: Secondary | ICD-10-CM | POA: Diagnosis not present

## 2024-03-12 DIAGNOSIS — Z1231 Encounter for screening mammogram for malignant neoplasm of breast: Secondary | ICD-10-CM

## 2024-03-12 DIAGNOSIS — R011 Cardiac murmur, unspecified: Secondary | ICD-10-CM | POA: Diagnosis not present

## 2024-03-12 DIAGNOSIS — I10 Essential (primary) hypertension: Secondary | ICD-10-CM | POA: Diagnosis not present

## 2024-03-12 LAB — ECHOCARDIOGRAM COMPLETE
AR max vel: 1.94 cm2
AV Area VTI: 2.03 cm2
AV Area mean vel: 1.86 cm2
AV Mean grad: 3 mmHg
AV Peak grad: 5.5 mmHg
Ao pk vel: 1.17 m/s
Area-P 1/2: 2.88 cm2
MV VTI: 1.44 cm2
S' Lateral: 2.8 cm

## 2024-03-13 NOTE — Telephone Encounter (Signed)
-----   Message from Jennifer JONELLE Crape sent at 03/12/2024  4:36 PM EDT ----- The renal arteries are fine.  I would like her to get angiogram looking at the arteries of the celiac plexus to evaluate these findings further.  Copy primary Jennifer JONELLE Crape, MD 03/12/2024 4:35 PM  ----- Message ----- From: Interface, [employer] Sent: 03/12/2024  11:52 AM EDT To: Jennifer JONELLE Crape, MD

## 2024-03-13 NOTE — Telephone Encounter (Signed)
 MyChart message

## 2024-03-17 NOTE — Telephone Encounter (Signed)
-----   Message from Jennifer SAUNDERS Revankar sent at 03/12/2024  6:11 PM EDT ----- The results of the study is unremarkable. Please inform patient. I will discuss in detail at next appointment. Cc  primary care/referring physician Jennifer SAUNDERS Crape, MD 03/12/2024 6:11 PM  ----- Message ----- From: Interface, [employer] Sent: 03/12/2024   5:46 PM EDT To: Jennifer SAUNDERS Crape, MD

## 2024-03-17 NOTE — Telephone Encounter (Signed)
 MyChart message

## 2024-03-19 ENCOUNTER — Other Ambulatory Visit: Payer: Self-pay

## 2024-03-19 ENCOUNTER — Other Ambulatory Visit: Payer: Self-pay | Admitting: Physician Assistant

## 2024-03-19 DIAGNOSIS — I10 Essential (primary) hypertension: Secondary | ICD-10-CM

## 2024-03-27 DIAGNOSIS — Z419 Encounter for procedure for purposes other than remedying health state, unspecified: Secondary | ICD-10-CM | POA: Diagnosis not present

## 2024-04-13 ENCOUNTER — Ambulatory Visit (INDEPENDENT_AMBULATORY_CARE_PROVIDER_SITE_OTHER)
Admission: RE | Admit: 2024-04-13 | Discharge: 2024-04-13 | Disposition: A | Discharge status: Home / Self Care | Source: Ambulatory Visit | Attending: Cardiology | Admitting: Cardiology

## 2024-04-13 DIAGNOSIS — K559 Vascular disorder of intestine, unspecified: Secondary | ICD-10-CM

## 2024-04-13 DIAGNOSIS — I700 Atherosclerosis of aorta: Secondary | ICD-10-CM | POA: Diagnosis not present

## 2024-04-13 DIAGNOSIS — K55059 Acute (reversible) ischemia of intestine, part and extent unspecified: Secondary | ICD-10-CM

## 2024-04-13 DIAGNOSIS — Z9049 Acquired absence of other specified parts of digestive tract: Secondary | ICD-10-CM | POA: Diagnosis not present

## 2024-04-13 DIAGNOSIS — K769 Liver disease, unspecified: Secondary | ICD-10-CM | POA: Diagnosis not present

## 2024-04-13 MED ORDER — IOHEXOL 350 MG/ML SOLN
100.0000 mL | Freq: Once | INTRAVENOUS | Status: AC | PRN
  Administered 2024-04-13: 100 mL via INTRAVENOUS

## 2024-04-16 ENCOUNTER — Encounter: Payer: Self-pay | Admitting: Physician Assistant

## 2024-04-17 ENCOUNTER — Encounter: Payer: Self-pay | Admitting: Cardiology

## 2024-04-22 ENCOUNTER — Encounter: Payer: Self-pay | Admitting: Cardiology

## 2024-04-22 ENCOUNTER — Ambulatory Visit: Payer: Self-pay | Admitting: Cardiology

## 2024-04-26 DIAGNOSIS — Z419 Encounter for procedure for purposes other than remedying health state, unspecified: Secondary | ICD-10-CM | POA: Diagnosis not present

## 2024-05-01 ENCOUNTER — Other Ambulatory Visit: Payer: Self-pay | Admitting: Physician Assistant

## 2024-05-01 ENCOUNTER — Ambulatory Visit
Admission: RE | Admit: 2024-05-01 | Discharge: 2024-05-01 | Disposition: A | Source: Ambulatory Visit | Attending: Physician Assistant | Admitting: Physician Assistant

## 2024-05-01 ENCOUNTER — Ambulatory Visit: Admitting: Physician Assistant

## 2024-05-01 DIAGNOSIS — Z1231 Encounter for screening mammogram for malignant neoplasm of breast: Secondary | ICD-10-CM

## 2024-05-04 ENCOUNTER — Ambulatory Visit: Admitting: Physician Assistant

## 2024-05-11 ENCOUNTER — Other Ambulatory Visit: Payer: Self-pay | Admitting: Medical Genetics

## 2024-05-11 DIAGNOSIS — Z006 Encounter for examination for normal comparison and control in clinical research program: Secondary | ICD-10-CM

## 2024-06-01 ENCOUNTER — Ambulatory Visit: Admitting: Physician Assistant

## 2024-06-09 ENCOUNTER — Encounter: Admitting: Physician Assistant

## 2024-06-17 ENCOUNTER — Encounter: Payer: Self-pay | Admitting: Physician Assistant

## 2024-06-17 ENCOUNTER — Ambulatory Visit: Admitting: Physician Assistant

## 2024-06-17 VITALS — BP 126/88 | HR 98 | Temp 97.9°F | Ht 69.0 in | Wt 214.8 lb

## 2024-06-17 DIAGNOSIS — E559 Vitamin D deficiency, unspecified: Secondary | ICD-10-CM

## 2024-06-17 DIAGNOSIS — Z0000 Encounter for general adult medical examination without abnormal findings: Secondary | ICD-10-CM

## 2024-06-17 DIAGNOSIS — J309 Allergic rhinitis, unspecified: Secondary | ICD-10-CM | POA: Diagnosis not present

## 2024-06-17 LAB — POCT URINALYSIS DIP (CLINITEK)
Bilirubin, UA: NEGATIVE
Blood, UA: NEGATIVE
Glucose, UA: NEGATIVE mg/dL
Ketones, POC UA: NEGATIVE mg/dL
Leukocytes, UA: NEGATIVE
Nitrite, UA: NEGATIVE
Spec Grav, UA: 1.02 (ref 1.010–1.025)
Urobilinogen, UA: NEGATIVE U/dL
pH, UA: 5.5 (ref 5.0–8.0)

## 2024-06-17 MED ORDER — LOSARTAN POTASSIUM 100 MG PO TABS: 100.0000 mg | ORAL_TABLET | Freq: Every day | ORAL | 1 refills | Status: AC

## 2024-06-17 MED ORDER — VITAMIN D (ERGOCALCIFEROL) 1.25 MG (50000 UNIT) PO CAPS: 50000.0000 [IU] | ORAL_CAPSULE | ORAL | 1 refills | Status: AC

## 2024-06-17 MED ORDER — NITROFURANTOIN MACROCRYSTAL 50 MG PO CAPS: 50.0000 mg | ORAL_CAPSULE | ORAL | 1 refills | Status: AC | PRN

## 2024-06-17 MED ORDER — FLUTICASONE PROPIONATE 50 MCG/ACT NA SUSP: 2.0000 | Freq: Every day | NASAL | 2 refills | Status: AC

## 2024-06-17 MED ORDER — ESTRADIOL 0.01 % VA CREA: 1.0000 | TOPICAL_CREAM | Freq: Every day | VAGINAL | 5 refills | Status: AC

## 2024-06-17 NOTE — Progress Notes (Signed)
 Subjective:  Patient ID: Kirsten James, female    DOB: 10/30/1965  Age: 58 y.o. MRN: [id number]  Chief Complaint  Patient presents with   Gynecologic Exam   Annual Exam    HPI Well Adult Physical: Patient here for a comprehensive physical exam.The patient reports she has been having some hot flashes -- currently on premarin cream - would like to consider other therapy for treatment (history of breast cancer with progesterone and estrogen receptive and HER2 negative per pt) Do you take any herbs or supplements that were not prescribed by a doctor? no Are you taking calcium  supplements? no Are you taking aspirin daily? no  Encounter for general adult medical examination without abnormal findings  Physical (At Risk items are starred): Patient's last physical exam was 1 year ago .  Patient is not afflicted from Stress Incontinence and Urge Incontinence  Patient wears a seat belts Patient has smoke detectors and has carbon monoxide detectors. Patient wears sunscreen with extended sun exposure. Dental Care: brushes and flosses daily. Last dental visit: up to date Vision impairments: none Ophthalmology/Optometry: Annual visit.  Hearing loss: none  Patient's last menstrual period was 11/14/2018 (exact date). Safe at home:  Self breast exams: yes Last pap: 4-5 years ago Last mammogram: 10/25     06/17/2024    1:41 PM 01/28/2024    9:17 AM 06/21/2023   10:25 AM 12/20/2022   11:22 AM 09/14/2022   10:42 AM  Depression screen PHQ 2/9  Decreased Interest 0 0 0 0 0  Down, Depressed, Hopeless 0 0 0 0 0  PHQ - 2 Score 0 0 0 0 0  Altered sleeping   0 0   Tired, decreased energy   1 0   Change in appetite   0 0   Feeling bad or failure about yourself    0 2   Trouble concentrating   0 0   Moving slowly or fidgety/restless   0 0   Suicidal thoughts   0 0   PHQ-9 Score   1  2    Difficult doing work/chores   Not difficult at all Not difficult at all      Data saved with a previous  flowsheet row definition         09/14/2022   10:42 AM 12/20/2022   11:22 AM 06/21/2023   10:25 AM 01/28/2024    9:17 AM 06/17/2024    1:41 PM  Fall Risk  Falls in the past year? 0 0 0 0 0  Was there an injury with Fall? 0  0  0  0  0  Fall Risk Category Calculator 0 0 0 0 0  Patient at Risk for Falls Due to No Fall Risks No Fall Risks No Fall Risks No Fall Risks No Fall Risks  Fall risk Follow up Falls evaluation completed Falls prevention discussed Falls evaluation completed  Falls evaluation completed     Data saved with a previous flowsheet row definition             Social Hx   Social History   Socioeconomic History   Marital status: Divorced    Spouse name: Not on file   Number of children: 2   Years of education: Not on file   Highest education level: Associate degree: occupational, scientist, product/process development, or vocational program  Occupational History   Not on file  Tobacco Use   Smoking status: Former    Current packs/day: 0.00    Average packs/day:  0.5 packs/day for 15.0 years (7.5 ttl pk-yrs)    Types: Cigarettes, E-cigarettes    Start date: 10/11/2004    Quit date: 10/12/2019    Years since quitting: 4.6   Smokeless tobacco: Never   Tobacco comments:    1/2 pack for 15-20 yrs quit 1995 with occ vaping quit 09/2019  Vaping Use   Vaping status: Every Day   Start date: 10/28/2021   Substances: Nicotine, Flavoring  Substance and Sexual Activity   Alcohol use: Not Currently    Comment: Special occasions. Maybe 4 a year   Drug use: Never   Sexual activity: Yes    Partners: Male    Birth control/protection: Post-menopausal, None    Comment: Post menopausal  Other Topics Concern   Not on file  Social History Narrative   Quit smoking [teen- 1995; quit]; no alcohol. Lives in [place] by self; daughter in . RN- surveyor, quantity.    Social Drivers of Corporate Investment Banker Strain: Low Risk  (06/13/2024)   Overall Financial Resource Strain (CARDIA)     Difficulty of Paying Living Expenses: Not hard at all  Food Insecurity: No Food Insecurity (06/13/2024)   Hunger Vital Sign    Worried About Running Out of Food in the Last Year: Never true    Ran Out of Food in the Last Year: Never true  Transportation Needs: No Transportation Needs (06/13/2024)   PRAPARE - Administrator, Civil Service (Medical): No    Lack of Transportation (Non-Medical): No  Physical Activity: Sufficiently Active (06/13/2024)   Exercise Vital Sign    Days of Exercise per Week: 5 days    Minutes of Exercise per Session: 60 min  Stress: Stress Concern Present (06/13/2024)   [employer] of Occupational Health - Occupational Stress Questionnaire    Feeling of Stress: Rather much  Social Connections: Socially Integrated (06/13/2024)   Social Connection and Isolation Panel    Frequency of Communication with Friends and Family: More than three times a week    Frequency of Social Gatherings with Friends and Family: More than three times a week    Attends Religious Services: 1 to 4 times per year    Active Member of [employer] or Organizations: Yes    Attends Banker Meetings: More than 4 times per year    Marital Status: Living with partner   Past Medical History:  Diagnosis Date   Allergic rhinitis 10/23/2021   Allergy    Anxiety 09/25/2023   Arrhythmia 20 yrs ago   Started on cartia    Arthralgia 01/24/2022   Formatting of this note might be different from the original. Last Assessment & Plan: Formatting of this note might be different from the original. Pt currently taking ibuprofen 600 mg multiple times a day Advised she d/c and switch for celebrex  100 mg bid. Pt states mobic  caused memory loss.     Body mass index (BMI) of 33.0-33.9 in adult 09/15/2019   Breast cancer (HCC)    BV (bacterial vaginosis) 09/25/2023   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   Carcinoma of overlapping sites of right breast in female, estrogen  receptor positive (HCC) 04/20/2020   Formatting of this note might be different from the original. Last Assessment & Plan: Formatting of this note might be different from the original. #Right breast-invasive mammary carcinoma [2 foci-9:00-7 mm; 1:00-13 millimeter-ONCOTYPE- 11; risk of recurrence- 3% with endocrine with endocrine therapy alone.  No significant benefit from  adjuvant chemotherapy.  Would not recommend adjuvant chemother   Chronic UTI 10/23/2021   Class 1 drug-induced obesity with serious comorbidity and body mass index (BMI) of 33.0 to 33.9 in adult 09/25/2023   Complication of anesthesia    hiccups    Dysplastic nevus 06/16/2020   L buttocks   Essential hypertension 09/15/2019   Formatting of this note might be different from the original. Last Assessment & Plan: Formatting of this note might be different from the original. Elevated in office and at home . Would like to add losartan  25 mg, pt declines. Advised dash/low salt diet. Advised increase in exercise. Reviewed last cmp F/u 6 mo     Frequent urinary tract infections    GERD (gastroesophageal reflux disease) 2020   Otc alka seltzer   Herniated nucleus pulposus, L5-S1 04/30/2022   History of cancer of right breast 06/06/2020   Hypertension    Lumbar radiculitis 04/30/2022   Menopausal vasomotor syndrome 09/14/2022   Menopause 09/15/2019   Mixed hyperlipidemia 09/14/2022   S/P mastectomy, right 05/09/2020   Vitamin D  deficiency 09/15/2019   Past Surgical History:  Procedure Laterality Date   BREAST BIOPSY Right 04/05/2020   us  bx, Q shape marker 9:00,  INVASIVE MAMMARY CARCINOMA   BREAST BIOPSY Right 04/05/2020   us  bx, x shape marker 1:00, INVASIVE MAMMARY CARCINOMA   BREAST RECONSTRUCTION WITH PLACEMENT OF TISSUE EXPANDER AND FLEX HD (ACELLULAR HYDRATED DERMIS) Right 04/25/2020   Procedure: BREAST RECONSTRUCTION WITH PLACEMENT OF TISSUE EXPANDER AND FLEX HD (ACELLULAR HYDRATED DERMIS);  Surgeon: Elisabeth Craig RAMAN, MD;   Location: [facility];  Service: Plastics;  Laterality: Right;   CHOLECYSTECTOMY     L4-5 herniated disc repair     LIPOSUCTION WITH LIPOFILLING Right 11/15/2020   Procedure: Right breast fat grafting and excess skin excision;  Surgeon: Elisabeth Craig RAMAN, MD;  Location: [facility];  Service: Plastics;  Laterality: Right;   MASTECTOMY Right    MASTOPEXY Left 06/14/2020   Procedure: LEFT BREAST MASTOPEXY;  Surgeon: Elisabeth Craig RAMAN, MD;  Location: [facility];  Service: Plastics;  Laterality: Left;   NASAL SINUS SURGERY  2005   PLANTAR FASCIA RELEASE     REMOVAL OF TISSUE EXPANDER AND PLACEMENT OF IMPLANT Right 06/14/2020   Procedure: REMOVAL OF TISSUE EXPANDER AND PLACEMENT OF IMPLANT;  Surgeon: Elisabeth Craig RAMAN, MD;  Location:  SURGERY CENTER;  Service: Plastics;  Laterality: Right;   SIMPLE MASTECTOMY WITH AXILLARY SENTINEL NODE BIOPSY Right 04/25/2020   Procedure: SIMPLE MASTECTOMY WITH AXILLARY SENTINEL NODE BIOPSY;  Surgeon: Lane Shope, MD;  Location: [facility];  Service: General;  Laterality: Right;   SPINE SURGERY  2010   Microdiscrctomy L -4,5    Family History  Problem Relation Age of Onset   Cancer Mother        non-small cell lung cancer- smoker   Alcohol abuse Mother    Stroke Mother    Hypertension Mother    Cancer Father        oral & larynx- smoker   Alcohol abuse Father    Melanoma Maternal Uncle    Alzheimer's disease Maternal Grandmother    Heart failure Maternal Grandmother    Breast cancer Neg Hx     ROS CONSTITUTIONAL: Negative for chills, fatigue, fever, unintentional weight gain and unintentional weight loss.  E/N/T: Negative for ear pain, nasal congestion and sore throat.  CARDIOVASCULAR: Negative for chest pain, dizziness, palpitations and pedal edema.  RESPIRATORY: Negative for  recent cough and dyspnea.  GASTROINTESTINAL: Negative for abdominal pain, acid reflux symptoms, constipation, diarrhea, nausea and vomiting.   GU - see HPI MSK: Negative for arthralgias and myalgias.  INTEGUMENTARY: Negative for rash.  NEUROLOGICAL: Negative for dizziness and headaches.  PSYCHIATRIC: Negative for sleep disturbance and to question depression screen.  Negative for depression, negative for anhedonia.   Objective:  PHYSICAL EXAM:  Tinnie Clover CMA chaperone BP 126/88 (BP Location: Left Arm, Patient Position: Sitting)   Pulse 98   Temp 97.9 F (36.6 C) (Temporal)   Ht 5' 9 (1.753 m)   Wt 214 lb 12.8 oz (97.4 kg)   LMP 11/14/2018 (Exact Date)   SpO2 98%   BMI 31.72 kg/m   Vision Screening   Right eye Left eye Both eyes  Without correction     With correction 20/25 20/25 20/25     GEN: Well nourished, well developed, in no acute distress  HEENT: normal external ears and nose - normal external auditory canals and TMS -  - Lips, Teeth and Gums - normal  Oropharynx - normal mucosa, palate, and posterior pharynx Cardiac: RRR; no murmurs, rubs, or gallops,no edema - Respiratory:  normal respiratory rate and pattern with no distress - normal breath sounds with no rales, rhonchi, wheezes or rubs Breast exam - pt deferred GI: normal bowel sounds, no masses or tenderness GU - normal external genitalia - cervix normal - pap obtained - bimanual exam normal MS: no deformity or atrophy  Skin: warm and dry, no rash  Neuro:  Alert and Oriented x 3,  - CN II-Xii grossly intact Psych: euthymic mood, appropriate affect and demeanor  Office Visit on 06/17/2024  Component Date Value Ref Range Status   Glucose, UA 06/17/2024 negative  negative mg/dL Final   Bilirubin, UA 87/96/7974 negative  negative Final   Ketones, POC UA 06/17/2024 negative  negative mg/dL Final   Spec Grav, UA 87/96/7974 1.020  1.010 - 1.025 Final   Blood, UA 06/17/2024 negative  negative Final   pH, UA 06/17/2024 5.5  5.0 - 8.0 Final   POC PROTEIN,UA 06/17/2024 trace  negative, trace Final   Urobilinogen, UA 06/17/2024 negative  0.2 or 1.0  E.U./dL Final   Nitrite, UA 87/96/7974 Negative  Negative Final   Leukocytes, UA 06/17/2024 Negative  Negative Final    Assessment & Plan:  Routine physical examination -     POCT URINALYSIS DIP (CLINITEK) -     CBC with Differential/Platelet -     Comprehensive metabolic panel with GFR -     TSH -     Lipid panel -     VITAMIN D  25 Hydroxy (Vit-D Deficiency, Fractures) -     IGP, Aptima HPV, rfx 16/18,45  Vitamin D  deficiency -     VITAMIN D  25 Hydroxy (Vit-D Deficiency, Fractures) -     Vitamin D  (Ergocalciferol ); Take 1 capsule (50,000 Units total) by mouth every 7 (seven) days.  Dispense: 12 capsule; Refill: 1  Allergic rhinitis, unspecified seasonality, unspecified trigger -     Fluticasone  Propionate; Place 2 sprays into both nostrils daily.  Dispense: 48 mL; Refill: 2  Other orders -     Nitrofurantoin  Macrocrystal; Take 1 capsule (50 mg total) by mouth as needed (UTI SYMPTOMS).  Dispense: 20 capsule; Refill: 1 -     Losartan  Potassium; Take 1 tablet (100 mg total) by mouth daily.  Dispense: 90 tablet; Refill: 1 -     Estradiol ; Place 1 Applicatorful vaginally at bedtime.  Dispense: 42.5 g; Refill: 5   Education given Recommend continue to work on eating healthy diet and exercise.  This is a list of the screening recommended for you and due dates:  Health Maintenance  Topic Date Due   Pap with HPV screening  06/07/2023   Flu Shot  10/13/2024*   Hepatitis B Vaccine (1 of 3 - 19+ 3-dose series) 01/27/2025*   Pneumococcal Vaccine for age over 70 (1 of 1 - PCV) 06/17/2025*   Cologuard (Stool DNA test)  02/22/2025   Breast Cancer Screening  05/01/2025   DTaP/Tdap/Td vaccine (2 - Tdap) 10/24/2031   Hepatitis C Screening  Completed   HIV Screening  Completed   Zoster (Shingles) Vaccine  Completed   HPV Vaccine  Aged Out   Meningitis B Vaccine  Aged Out   COVID-19 Vaccine  Discontinued  *Topic was postponed. The date shown is not the original due date.     Follow-up:  Return in about 6 months (around 12/16/2024) for chronic fasting follow-up.  An After Visit Summary was printed and given to the patient.  CAMIE JONELLE NICHOLAUS [name] [facility] [phone]

## 2024-06-18 ENCOUNTER — Ambulatory Visit: Payer: Self-pay | Admitting: Physician Assistant

## 2024-06-18 LAB — COMPREHENSIVE METABOLIC PANEL WITH GFR
ALT: 23 IU/L (ref 0–32)
AST: 20 IU/L (ref 0–40)
Albumin: 4.5 g/dL (ref 3.8–4.9)
Alkaline Phosphatase: 90 IU/L (ref 49–135)
BUN/Creatinine Ratio: 14 (ref 9–23)
BUN: 11 mg/dL (ref 6–24)
Bilirubin Total: 0.4 mg/dL (ref 0.0–1.2)
CO2: 23 mmol/L (ref 20–29)
Calcium: 9.4 mg/dL (ref 8.7–10.2)
Chloride: 100 mmol/L (ref 96–106)
Creatinine, Ser: 0.81 mg/dL (ref 0.57–1.00)
Globulin, Total: 2.6 g/dL (ref 1.5–4.5)
Glucose: 81 mg/dL (ref 70–99)
Potassium: 4.4 mmol/L (ref 3.5–5.2)
Sodium: 138 mmol/L (ref 134–144)
Total Protein: 7.1 g/dL (ref 6.0–8.5)
eGFR: 84 mL/min/1.73 (ref >59)

## 2024-06-18 LAB — CBC WITH DIFFERENTIAL/PLATELET
Basophils Absolute: 0.1 x10E3/uL (ref 0.0–0.2)
Basos: 1 %
EOS (ABSOLUTE): 0.1 x10E3/uL (ref 0.0–0.4)
Eos: 1 %
Hematocrit: 44.2 % (ref 34.0–46.6)
Hemoglobin: 14.5 g/dL (ref 11.1–15.9)
Immature Grans (Abs): 0 x10E3/uL (ref 0.0–0.1)
Immature Granulocytes: 0 %
Lymphocytes Absolute: 2.6 x10E3/uL (ref 0.7–3.1)
Lymphs: 32 %
MCH: 29 pg (ref 26.6–33.0)
MCHC: 32.8 g/dL (ref 31.5–35.7)
MCV: 88 fL (ref 79–97)
Monocytes Absolute: 0.5 x10E3/uL (ref 0.1–0.9)
Monocytes: 6 %
Neutrophils Absolute: 4.8 x10E3/uL (ref 1.4–7.0)
Neutrophils: 60 %
Platelets: 292 x10E3/uL (ref 150–450)
RBC: 5 x10E6/uL (ref 3.77–5.28)
RDW: 13.9 % (ref 11.7–15.4)
WBC: 8.1 x10E3/uL (ref 3.4–10.8)

## 2024-06-18 LAB — TSH: TSH: 1.08 u[IU]/mL (ref 0.450–4.500)

## 2024-06-18 LAB — LIPID PANEL
Chol/HDL Ratio: 3.2 ratio (ref 0.0–4.4)
Cholesterol, Total: 194 mg/dL (ref 100–199)
HDL: 60 mg/dL (ref >39)
LDL Chol Calc (NIH): 103 mg/dL — ABNORMAL HIGH (ref 0–99)
Triglycerides: 178 mg/dL — ABNORMAL HIGH (ref 0–149)
VLDL Cholesterol Cal: 31 mg/dL (ref 5–40)

## 2024-06-18 LAB — VITAMIN D 25 HYDROXY (VIT D DEFICIENCY, FRACTURES): Vit D, 25-Hydroxy: 57.9 ng/mL (ref 30.0–100.0)

## 2024-06-19 LAB — IGP, APTIMA HPV, RFX 16/18,45
HPV Aptima: NEGATIVE
PAP Smear Comment: 0

## 2024-06-23 ENCOUNTER — Encounter

## 2024-06-23 ENCOUNTER — Encounter: Payer: Self-pay | Admitting: Physician Assistant

## 2024-06-25 ENCOUNTER — Ambulatory Visit: Admitting: Physician Assistant

## 2024-06-25 ENCOUNTER — Encounter: Payer: Self-pay | Admitting: Physician Assistant

## 2024-06-25 VITALS — BP 138/78 | HR 73 | Temp 97.7°F | Resp 16 | Ht 69.0 in | Wt 220.0 lb

## 2024-06-25 DIAGNOSIS — R10A2 Flank pain, left side: Secondary | ICD-10-CM | POA: Diagnosis not present

## 2024-06-25 DIAGNOSIS — M62838 Other muscle spasm: Secondary | ICD-10-CM | POA: Diagnosis not present

## 2024-06-25 DIAGNOSIS — R82998 Other abnormal findings in urine: Secondary | ICD-10-CM | POA: Diagnosis not present

## 2024-06-25 LAB — POCT URINALYSIS DIP (CLINITEK)
Bilirubin, UA: NEGATIVE
Blood, UA: NEGATIVE
Glucose, UA: NEGATIVE mg/dL
Ketones, POC UA: NEGATIVE mg/dL
Nitrite, UA: NEGATIVE
POC PROTEIN,UA: NEGATIVE
Spec Grav, UA: 1.015 (ref 1.010–1.025)
Urobilinogen, UA: 0.2 U/dL
pH, UA: 6.5 (ref 5.0–8.0)

## 2024-06-25 MED ORDER — CYCLOBENZAPRINE HCL 5 MG PO TABS: 5.0000 mg | ORAL_TABLET | Freq: Two times a day (BID) | ORAL | 1 refills | Status: AC | PRN

## 2024-06-25 NOTE — Progress Notes (Signed)
 Acute Office Visit  Subjective:    Patient ID: Kirsten James, female    DOB: 20-Feb-1966, 58 y.o.   MRN: [id number]  Chief Complaint  Patient presents with   Flank Pain    HPI: Patient is in today for complaints of left flank and low back pain that started over the weekend - states worse with certain movements and tender to touch.  She has been exercising and running more recently.  Cannot recall injury or trauma Denies urine symptoms and no hematuria Denies abdominal pain, nausea, vomiting  Current Medications[1]  Allergies[2]  ROS CONSTITUTIONAL: Negative for chills, fatigue, fever,  CARDIOVASCULAR: Negative for chest pain, dizziness, palpitations and pedal edema.  RESPIRATORY: Negative for recent cough and dyspnea.  GASTROINTESTINAL: Negative for abdominal pain, acid reflux symptoms, constipation, diarrhea, nausea and vomiting.  MSK: see HPI GU - negative INTEGUMENTARY: Negative for rash.       Objective:    PHYSICAL EXAM:   BP 138/78   Pulse 73   Temp 97.7 F (36.5 C)   Resp 16   Ht 5' 9 (1.753 m)   Wt 220 lb (99.8 kg)   LMP 11/14/2018   SpO2 100%   BMI 32.49 kg/m    GEN: Well nourished, well developed, in no acute distress  Cardiac: RRR; no murmurs,  Respiratory:  normal respiratory rate and pattern with no distress - normal breath sounds with no rales, rhonchi, wheezes or rubs GI: normal bowel sounds, no masses or tenderness MS: no deformity or atrophy -- tender to touch left flank with muscle spasm noted Skin: warm and dry, no rash   Office Visit on 06/25/2024  Component Date Value Ref Range Status   Color, UA 06/25/2024 yellow  yellow Final   Clarity, UA 06/25/2024 clear  clear Final   Glucose, UA 06/25/2024 negative  negative mg/dL Final   Bilirubin, UA 87/88/7974 negative  negative Final   Ketones, POC UA 06/25/2024 negative  negative mg/dL Final   Spec Grav, UA 87/88/7974 1.015  1.010 - 1.025 Final   Blood, UA 06/25/2024 negative   negative Final   pH, UA 06/25/2024 6.5  5.0 - 8.0 Final   POC PROTEIN,UA 06/25/2024 negative  negative, trace Final   Urobilinogen, UA 06/25/2024 0.2  0.2 or 1.0 E.U./dL Final   Nitrite, UA 87/88/7974 Negative  Negative Final   Leukocytes, UA 06/25/2024 Small (1+) (A)  Negative Final        Assessment & Plan:    Left flank pain -     POCT URINALYSIS DIP (CLINITEK) Advil as needed Alternate heat/ice Leukocytes in urine -     Urine Culture  Muscle spasm -     Cyclobenzaprine HCl; Take 1 tablet (5 mg total) by mouth 2 (two) times daily as needed for muscle spasms.  Dispense: 10 tablet; Refill: 1     Follow-up: Return if symptoms worsen or fail to improve.  An After Visit Summary was printed and given to the patient.  SARA R Twanisha Foulk, PA-C [facility] [phone]    [1]  Current Outpatient Medications:    Cyanocobalamin (VITAMIN B-12 PO), Take 1 tablet by mouth daily., Disp: , Rfl:    cyclobenzaprine (FLEXERIL) 5 MG tablet, Take 1 tablet (5 mg total) by mouth 2 (two) times daily as needed for muscle spasms., Disp: 10 tablet, Rfl: 1   estradiol  (ESTRACE ) 0.01 % CREA vaginal cream, Place 1 Applicatorful vaginally at bedtime., Disp: 42.5 g, Rfl: 5   fluticasone  (FLONASE ) 50  MCG/ACT nasal spray, Place 2 sprays into both nostrils daily., Disp: 48 mL, Rfl: 2   losartan  (COZAAR ) 100 MG tablet, Take 1 tablet (100 mg total) by mouth daily., Disp: 90 tablet, Rfl: 1   Multiple Vitamin (MULTIVITAMIN WITH MINERALS) TABS tablet, Take 1 tablet by mouth daily., Disp: , Rfl:    nitrofurantoin  (MACRODANTIN ) 50 MG capsule, Take 1 capsule (50 mg total) by mouth as needed (UTI SYMPTOMS)., Disp: 20 capsule, Rfl: 1   Vitamin D , Ergocalciferol , (DRISDOL ) 1.25 MG (50000 UNIT) CAPS capsule, Take 1 capsule (50,000 Units total) by mouth every 7 (seven) days., Disp: 12 capsule, Rfl: 1 [2]  Allergies Allergen Reactions   Other     Allergy to pollen and mold- patient reports watery eyes, runny  nose and sneezing as reaction   Amoxicillin Hives   Penicillins Hives and Rash

## 2024-06-26 LAB — URINE CULTURE: Organism ID, Bacteria: NO GROWTH

## 2024-06-30 ENCOUNTER — Ambulatory Visit: Payer: Self-pay | Admitting: Physician Assistant

## 2024-07-21 DIAGNOSIS — Z0120 Encounter for dental examination and cleaning without abnormal findings: Secondary | ICD-10-CM | POA: Diagnosis not present

## 2024-08-17 ENCOUNTER — Other Ambulatory Visit: Payer: Self-pay | Admitting: Physician Assistant

## 2024-08-17 ENCOUNTER — Encounter: Payer: Self-pay | Admitting: Physician Assistant

## 2024-08-17 ENCOUNTER — Other Ambulatory Visit (HOSPITAL_COMMUNITY): Payer: Self-pay

## 2024-08-17 MED ORDER — WEGOVY 0.25 MG/0.5ML ~~LOC~~ SOAJ: 0.2500 mg | SUBCUTANEOUS | 0 refills | Status: AC

## 2024-08-18 ENCOUNTER — Telehealth: Payer: Self-pay | Admitting: Pharmacy Technician

## 2024-08-18 ENCOUNTER — Other Ambulatory Visit (HOSPITAL_COMMUNITY): Payer: Self-pay

## 2024-08-19 ENCOUNTER — Other Ambulatory Visit (HOSPITAL_COMMUNITY): Payer: Self-pay

## 2024-08-19 NOTE — Telephone Encounter (Signed)
 Pharmacy Patient Advocate Encounter  Received notification from [facility] MEDICAID that Prior Authorization for Wegovy  0.25MG /0.5ML auto-injectors  has been APPROVED from 08/18/2024 to 02/14/2025. Ran test claim, Copay is $4.00. This test claim was processed through [facility]- copay amounts may vary at other pharmacies due to pharmacy/plan contracts, or as the patient moves through the different stages of their insurance plan.   PA #/Case ID/Reference #: [id number]

## 2024-12-16 ENCOUNTER — Ambulatory Visit: Admitting: Physician Assistant
